# Patient Record
Sex: Female | Born: 1935 | Race: White | Hispanic: No | Marital: Married | Smoking: Never smoker
Health system: Southern US, Community
[De-identification: ages and names within clinical notes are randomized; demographics above are authoritative.]

## PROBLEM LIST (undated history)

## (undated) DIAGNOSIS — K219 Gastro-esophageal reflux disease without esophagitis: Secondary | ICD-10-CM

## (undated) DIAGNOSIS — R188 Other ascites: Secondary | ICD-10-CM

## (undated) DIAGNOSIS — F319 Bipolar disorder, unspecified: Secondary | ICD-10-CM

## (undated) DIAGNOSIS — M1990 Unspecified osteoarthritis, unspecified site: Secondary | ICD-10-CM

## (undated) DIAGNOSIS — I509 Heart failure, unspecified: Secondary | ICD-10-CM

## (undated) DIAGNOSIS — I10 Essential (primary) hypertension: Secondary | ICD-10-CM

## (undated) DIAGNOSIS — J90 Pleural effusion, not elsewhere classified: Secondary | ICD-10-CM

## (undated) HISTORY — PX: CHOLECYSTECTOMY: SHX55

---

## 2015-10-27 DIAGNOSIS — J90 Pleural effusion, not elsewhere classified: Secondary | ICD-10-CM

## 2015-10-27 HISTORY — DX: Pleural effusion, not elsewhere classified: J90

## 2015-11-12 ENCOUNTER — Emergency Department (HOSPITAL_COMMUNITY): Payer: Medicare Other

## 2015-11-12 ENCOUNTER — Inpatient Hospital Stay (HOSPITAL_COMMUNITY): Payer: Medicare Other

## 2015-11-12 ENCOUNTER — Inpatient Hospital Stay (HOSPITAL_COMMUNITY)
Admission: EM | Admit: 2015-11-12 | Discharge: 2015-11-23 | DRG: 441 | Disposition: A | Discharge status: Skilled Nursing Facility | Payer: Medicare Other | Attending: Internal Medicine | Admitting: Internal Medicine

## 2015-11-12 ENCOUNTER — Encounter (HOSPITAL_COMMUNITY): Payer: Self-pay | Admitting: Emergency Medicine

## 2015-11-12 DIAGNOSIS — I4891 Unspecified atrial fibrillation: Secondary | ICD-10-CM | POA: Diagnosis present

## 2015-11-12 DIAGNOSIS — I951 Orthostatic hypotension: Secondary | ICD-10-CM | POA: Diagnosis not present

## 2015-11-12 DIAGNOSIS — J9601 Acute respiratory failure with hypoxia: Secondary | ICD-10-CM | POA: Diagnosis present

## 2015-11-12 DIAGNOSIS — K7689 Other specified diseases of liver: Secondary | ICD-10-CM | POA: Diagnosis not present

## 2015-11-12 DIAGNOSIS — R0902 Hypoxemia: Secondary | ICD-10-CM

## 2015-11-12 DIAGNOSIS — Z6841 Body Mass Index (BMI) 40.0 and over, adult: Secondary | ICD-10-CM

## 2015-11-12 DIAGNOSIS — K7581 Nonalcoholic steatohepatitis (NASH): Secondary | ICD-10-CM | POA: Diagnosis not present

## 2015-11-12 DIAGNOSIS — R7989 Other specified abnormal findings of blood chemistry: Secondary | ICD-10-CM | POA: Diagnosis present

## 2015-11-12 DIAGNOSIS — R109 Unspecified abdominal pain: Secondary | ICD-10-CM | POA: Insufficient documentation

## 2015-11-12 DIAGNOSIS — I10 Essential (primary) hypertension: Secondary | ICD-10-CM | POA: Diagnosis not present

## 2015-11-12 DIAGNOSIS — D72829 Elevated white blood cell count, unspecified: Secondary | ICD-10-CM

## 2015-11-12 DIAGNOSIS — I248 Other forms of acute ischemic heart disease: Secondary | ICD-10-CM | POA: Diagnosis present

## 2015-11-12 DIAGNOSIS — N179 Acute kidney failure, unspecified: Secondary | ICD-10-CM | POA: Insufficient documentation

## 2015-11-12 DIAGNOSIS — R188 Other ascites: Secondary | ICD-10-CM | POA: Insufficient documentation

## 2015-11-12 DIAGNOSIS — K769 Liver disease, unspecified: Secondary | ICD-10-CM | POA: Diagnosis not present

## 2015-11-12 DIAGNOSIS — K767 Hepatorenal syndrome: Secondary | ICD-10-CM | POA: Diagnosis present

## 2015-11-12 DIAGNOSIS — I130 Hypertensive heart and chronic kidney disease with heart failure and stage 1 through stage 4 chronic kidney disease, or unspecified chronic kidney disease: Secondary | ICD-10-CM | POA: Diagnosis present

## 2015-11-12 DIAGNOSIS — F319 Bipolar disorder, unspecified: Secondary | ICD-10-CM | POA: Diagnosis present

## 2015-11-12 DIAGNOSIS — E872 Acidosis, unspecified: Secondary | ICD-10-CM

## 2015-11-12 DIAGNOSIS — J918 Pleural effusion in other conditions classified elsewhere: Secondary | ICD-10-CM

## 2015-11-12 DIAGNOSIS — G9340 Encephalopathy, unspecified: Secondary | ICD-10-CM | POA: Diagnosis not present

## 2015-11-12 DIAGNOSIS — R1084 Generalized abdominal pain: Secondary | ICD-10-CM | POA: Diagnosis not present

## 2015-11-12 DIAGNOSIS — R0602 Shortness of breath: Secondary | ICD-10-CM

## 2015-11-12 DIAGNOSIS — K744 Secondary biliary cirrhosis: Secondary | ICD-10-CM | POA: Diagnosis not present

## 2015-11-12 DIAGNOSIS — R627 Adult failure to thrive: Secondary | ICD-10-CM | POA: Diagnosis present

## 2015-11-12 DIAGNOSIS — E8809 Other disorders of plasma-protein metabolism, not elsewhere classified: Secondary | ICD-10-CM | POA: Diagnosis present

## 2015-11-12 DIAGNOSIS — Z79899 Other long term (current) drug therapy: Secondary | ICD-10-CM

## 2015-11-12 DIAGNOSIS — K219 Gastro-esophageal reflux disease without esophagitis: Secondary | ICD-10-CM | POA: Diagnosis present

## 2015-11-12 DIAGNOSIS — Z515 Encounter for palliative care: Secondary | ICD-10-CM

## 2015-11-12 DIAGNOSIS — R140 Abdominal distension (gaseous): Secondary | ICD-10-CM

## 2015-11-12 DIAGNOSIS — E871 Hypo-osmolality and hyponatremia: Secondary | ICD-10-CM | POA: Diagnosis present

## 2015-11-12 DIAGNOSIS — R778 Other specified abnormalities of plasma proteins: Secondary | ICD-10-CM | POA: Diagnosis present

## 2015-11-12 DIAGNOSIS — N183 Chronic kidney disease, stage 3 (moderate): Secondary | ICD-10-CM | POA: Diagnosis not present

## 2015-11-12 DIAGNOSIS — J90 Pleural effusion, not elsewhere classified: Secondary | ICD-10-CM | POA: Diagnosis not present

## 2015-11-12 DIAGNOSIS — J948 Other specified pleural conditions: Secondary | ICD-10-CM | POA: Diagnosis not present

## 2015-11-12 DIAGNOSIS — K7460 Unspecified cirrhosis of liver: Secondary | ICD-10-CM | POA: Insufficient documentation

## 2015-11-12 DIAGNOSIS — R601 Generalized edema: Secondary | ICD-10-CM | POA: Diagnosis present

## 2015-11-12 DIAGNOSIS — I5033 Acute on chronic diastolic (congestive) heart failure: Secondary | ICD-10-CM

## 2015-11-12 DIAGNOSIS — Z66 Do not resuscitate: Secondary | ICD-10-CM | POA: Diagnosis present

## 2015-11-12 DIAGNOSIS — N189 Chronic kidney disease, unspecified: Secondary | ICD-10-CM | POA: Diagnosis present

## 2015-11-12 DIAGNOSIS — I959 Hypotension, unspecified: Secondary | ICD-10-CM | POA: Insufficient documentation

## 2015-11-12 DIAGNOSIS — C801 Malignant (primary) neoplasm, unspecified: Secondary | ICD-10-CM

## 2015-11-12 DIAGNOSIS — J189 Pneumonia, unspecified organism: Secondary | ICD-10-CM | POA: Insufficient documentation

## 2015-11-12 DIAGNOSIS — A419 Sepsis, unspecified organism: Secondary | ICD-10-CM | POA: Diagnosis present

## 2015-11-12 DIAGNOSIS — Z881 Allergy status to other antibiotic agents status: Secondary | ICD-10-CM | POA: Diagnosis not present

## 2015-11-12 DIAGNOSIS — E8729 Other acidosis: Secondary | ICD-10-CM

## 2015-11-12 DIAGNOSIS — Z9889 Other specified postprocedural states: Secondary | ICD-10-CM

## 2015-11-12 HISTORY — DX: Unspecified osteoarthritis, unspecified site: M19.90

## 2015-11-12 HISTORY — DX: Pleural effusion, not elsewhere classified: J90

## 2015-11-12 HISTORY — DX: Gastro-esophageal reflux disease without esophagitis: K21.9

## 2015-11-12 HISTORY — DX: Bipolar disorder, unspecified: F31.9

## 2015-11-12 HISTORY — DX: Essential (primary) hypertension: I10

## 2015-11-12 HISTORY — DX: Other ascites: R18.8

## 2015-11-12 HISTORY — DX: Heart failure, unspecified: I50.9

## 2015-11-12 LAB — BASIC METABOLIC PANEL
Anion gap: 17 — ABNORMAL HIGH (ref 5–15)
BUN: 54 mg/dL — ABNORMAL HIGH (ref 6–20)
CALCIUM: 8.8 mg/dL — AB (ref 8.9–10.3)
CHLORIDE: 93 mmol/L — AB (ref 101–111)
CO2: 21 mmol/L — ABNORMAL LOW (ref 22–32)
CREATININE: 2.56 mg/dL — AB (ref 0.44–1.00)
GFR calc non Af Amer: 17 mL/min — ABNORMAL LOW (ref >60)
GFR, EST AFRICAN AMERICAN: 19 mL/min — AB (ref >60)
Glucose, Bld: 124 mg/dL — ABNORMAL HIGH (ref 65–99)
Potassium: 4.6 mmol/L (ref 3.5–5.1)
SODIUM: 131 mmol/L — AB (ref 135–145)

## 2015-11-12 LAB — I-STAT ARTERIAL BLOOD GAS, ED
ACID-BASE DEFICIT: 1 mmol/L (ref 0.0–2.0)
Bicarbonate: 23 mEq/L (ref 20.0–24.0)
O2 Saturation: 94 %
PH ART: 7.412 (ref 7.350–7.450)
TCO2: 24 mmol/L (ref 0–100)
pCO2 arterial: 36 mmHg (ref 35.0–45.0)
pO2, Arterial: 69 mmHg — ABNORMAL LOW (ref 80.0–100.0)

## 2015-11-12 LAB — CBC WITH DIFFERENTIAL/PLATELET
BASOS ABS: 0 10*3/uL (ref 0.0–0.1)
BASOS PCT: 0 %
EOS ABS: 0 10*3/uL (ref 0.0–0.7)
Eosinophils Relative: 0 %
HCT: 39.2 % (ref 36.0–46.0)
HEMOGLOBIN: 13 g/dL (ref 12.0–15.0)
LYMPHS ABS: 1 10*3/uL (ref 0.7–4.0)
Lymphocytes Relative: 5 %
MCH: 30.7 pg (ref 26.0–34.0)
MCHC: 33.2 g/dL (ref 30.0–36.0)
MCV: 92.7 fL (ref 78.0–100.0)
Monocytes Absolute: 1.3 10*3/uL — ABNORMAL HIGH (ref 0.1–1.0)
Monocytes Relative: 6 %
NEUTROS PCT: 89 %
Neutro Abs: 19.9 10*3/uL — ABNORMAL HIGH (ref 1.7–7.7)
Platelets: 219 10*3/uL (ref 150–400)
RBC: 4.23 MIL/uL (ref 3.87–5.11)
RDW: 14.2 % (ref 11.5–15.5)
WBC: 22.3 10*3/uL — AB (ref 4.0–10.5)

## 2015-11-12 LAB — I-STAT CG4 LACTIC ACID, ED: LACTIC ACID, VENOUS: 2.26 mmol/L — AB (ref 0.5–2.0)

## 2015-11-12 LAB — BRAIN NATRIURETIC PEPTIDE: B NATRIURETIC PEPTIDE 5: 470.9 pg/mL — AB (ref 0.0–100.0)

## 2015-11-12 LAB — D-DIMER, QUANTITATIVE (NOT AT ARMC): D DIMER QUANT: 18.12 ug{FEU}/mL — AB (ref 0.00–0.50)

## 2015-11-12 LAB — I-STAT TROPONIN, ED: TROPONIN I, POC: 0.06 ng/mL (ref 0.00–0.08)

## 2015-11-12 MED ORDER — PIPERACILLIN-TAZOBACTAM 3.375 G IVPB 30 MIN
3.3750 g | Freq: Once | INTRAVENOUS | Status: AC
  Administered 2015-11-12: 3.375 g via INTRAVENOUS
  Filled 2015-11-12: qty 50

## 2015-11-12 MED ORDER — VANCOMYCIN HCL IN DEXTROSE 1-5 GM/200ML-% IV SOLN: 1000.0000 mg | Freq: Once | INTRAVENOUS | Status: DC

## 2015-11-12 MED ORDER — VANCOMYCIN HCL 10 G IV SOLR
2000.0000 mg | Freq: Once | INTRAVENOUS | Status: AC
  Administered 2015-11-12: 2000 mg via INTRAVENOUS
  Filled 2015-11-12: qty 2000

## 2015-11-12 MED ORDER — FLUCONAZOLE IN SODIUM CHLORIDE 200-0.9 MG/100ML-% IV SOLN
200.0000 mg | Freq: Once | INTRAVENOUS | Status: AC
  Administered 2015-11-13: 200 mg via INTRAVENOUS
  Filled 2015-11-12: qty 100

## 2015-11-12 MED ORDER — SODIUM CHLORIDE 0.9 % IV BOLUS (SEPSIS)
1000.0000 mL | Freq: Once | INTRAVENOUS | Status: AC
  Administered 2015-11-12: 1000 mL via INTRAVENOUS

## 2015-11-12 MED ORDER — MAGIC MOUTHWASH
5.0000 mL | Freq: Three times a day (TID) | ORAL | Status: DC
  Administered 2015-11-12 – 2015-11-20 (×15): 5 mL via ORAL
  Filled 2015-11-12 (×34): qty 5

## 2015-11-12 MED ORDER — FLUCONAZOLE IN SODIUM CHLORIDE 100-0.9 MG/50ML-% IV SOLN
100.0000 mg | INTRAVENOUS | Status: AC
  Administered 2015-11-13 – 2015-11-18 (×6): 100 mg via INTRAVENOUS
  Filled 2015-11-12 (×8): qty 50

## 2015-11-12 MED ORDER — ONDANSETRON HCL 4 MG/2ML IJ SOLN: 4.0000 mg | Freq: Once | INTRAMUSCULAR | Status: DC

## 2015-11-12 NOTE — Progress Notes (Signed)
Pharmacy Antibiotic Note  Kendra Martinez is a 82 y.o. female admitted on 11/12/2015 with sepsis.  Presented with SOB and weakness.  Recently admitted on 4/3 for Klebsiella UTI, discovered pleural effusion.  PMH includes CHF, HTN.  Pharmacy has been consulted for Vancomycin and Zosyn dosing.  On Admit: Afebrile, RR 91 (Afib), RR 25, WBC 22.3 Hypotensive (105/49), SpO2 88%, CrCl 21.6, LA 2.26  Plan: --Vancomycin 2000 mg IV x 1, then 1500 mg IV q48h --Zosyn 3.375 g IV q8h (extended infusion) --Obtain vanc trough at steady state (goal 15-20) --Follow renal function, clinical course and cultures.  Height: 5\' 2"  (157.5 cm) Weight: 258 lb (117.028 kg) IBW/kg (Calculated) : 50.1  Temp (24hrs), Avg:98 F (36.7 C), Min:98 F (36.7 C), Max:98 F (36.7 C)   Recent Labs Lab 11/12/15 1619 11/12/15 1703  WBC 22.3*  --   CREATININE 2.56*  --   LATICACIDVEN  --  2.26*    Estimated Creatinine Clearance: 21.6 mL/min (by C-G formula based on Cr of 2.56).    Allergies  Allergen Reactions  . Sulfa Antibiotics Hives    Antimicrobials this admission: 4/17 Vanc >>  4/17 Zosyn >>   Dose adjustments this admission:   Microbiology results: 4/17 BCx:     Thank you for allowing pharmacy to be a part of this patient's care.  Viann Fish 11/12/2015 8:07 PM

## 2015-11-12 NOTE — ED Notes (Signed)
Pt to CT at this time.

## 2015-11-12 NOTE — ED Notes (Signed)
AMA explained to pt by Dr. Oleta Mouse and Vernie Shanks PA   Pt st's he understands the risks of him leaving.  AMA form unable to be signed by pt due to sig. Pad not working

## 2015-11-12 NOTE — ED Notes (Signed)
Resp tech at bedside attempting to draw ABG at this time

## 2015-11-12 NOTE — Progress Notes (Signed)
Pharmacy consulted to dose Fluconazole for oral thrush. Give 200 mg IV x 1 now followed by 100 mg IV q 24 hours. F/u response and change to PO when feasible.  Vincenza Hews, PharmD, BCPS 11/12/2015, 11:17 PM Pager: [phone]

## 2015-11-12 NOTE — ED Provider Notes (Signed)
CSN: [id number]     Arrival date & time 11/12/15  1445 History   First MD Initiated Contact with Patient 11/12/15 1518     Chief Complaint  Patient presents with  . Shortness of Breath     (Consider location/radiation/quality/duration/timing/severity/associated sxs/prior Treatment) HPI   This is a 82 year old female brought in by her family for shortness of breath and worsening weakness. The patient was admitted to the hospital in [facility] on 10/29/2015 for shortness of breath. Patient was found to have a Klebsiella urinary tract infection for which she was treated. Patient was also found to have a large right-sided pleural effusion. She was treated with Lasix with some improvement of the effusion. Patient also had an echocardiogram which showed EF of 60, 65%, mild aortic valve stenosis. Her labs showed acute kidney injury with a creatinine about 2.5 and elevated BUN. She is a leukocytosis of about 11,000. Her family states that after she has been discharged. She has been weak, short of breath. Her husband and daughter state that she gets extremely winded doing minor activities such as standing up or moving to the Chair. Patient also states that she has a sore throat and sore mouth and is difficult to swallow. She feels very weak and has new-onset laryngitis over the past few days. She denies any melena or hematochezia. She is not on any blood thinners. She is felt to be in atrial fibrillation today and has no previous history of the same.  Past Medical History  Diagnosis Date  . CHF (congestive heart failure) ([place])   . Hypertension    History reviewed. No pertinent past surgical history. History reviewed. No pertinent family history. Social History  Substance Use Topics  . Smoking status: Never Smoker   . Smokeless tobacco: None  . Alcohol Use: No   OB History    No data available     Review of Systems  Ten systems reviewed and are negative for acute change, except as noted in  the HPI.    Allergies  Sulfa antibiotics  Home Medications   Prior to Admission medications   Medication Sig Start Date End Date Taking? Authorizing Provider  albuterol (PROVENTIL HFA;VENTOLIN HFA) 108 (90 Base) MCG/ACT inhaler Inhale 1-2 puffs into the lungs every 4 (four) hours as needed for wheezing or shortness of breath.   Yes Historical Provider, MD  ALPRAZolam (XANAX) 0.25 MG tablet Take 0.25 mg by mouth 3 (three) times daily as needed for anxiety.   Yes Historical Provider, MD  furosemide (LASIX) 20 MG tablet Take 20 mg by mouth daily.   Yes Historical Provider, MD  metoprolol tartrate (LOPRESSOR) 25 MG tablet Take 25 mg by mouth 2 (two) times daily.   Yes Historical Provider, MD  mometasone-formoterol (DULERA) 200-5 MCG/ACT AERO Inhale 2 puffs into the lungs 2 (two) times daily.   Yes Historical Provider, MD  Multiple Vitamin (MULTIVITAMIN) tablet Take 1 tablet by mouth daily.   Yes Historical Provider, MD  omeprazole (PRILOSEC) 20 MG capsule Take 20 mg by mouth daily.   Yes Historical Provider, MD  ondansetron (ZOFRAN) 4 MG tablet Take 4 mg by mouth daily as needed for nausea.   Yes Historical Provider, MD  promethazine (PHENERGAN) 25 MG tablet Take 25 mg by mouth every 8 (eight) hours as needed for nausea or vomiting.   Yes Historical Provider, MD  sertraline (ZOLOFT) 25 MG tablet Take 25 mg by mouth daily.   Yes Historical Provider, MD  Tiotropium Bromide Monohydrate (SPIRIVA  RESPIMAT) 2.5 MCG/ACT AERS Inhale 2 puffs into the lungs daily.    Yes Historical Provider, MD  traMADol (ULTRAM) 50 MG tablet Take 50 mg by mouth every 12 (twelve) hours as needed for moderate pain.   Yes Historical Provider, MD   BP 111/56 mmHg  Pulse 102  Temp(Src) 98 F (36.7 C) (Oral)  Resp 20  SpO2 94% Physical Exam  Constitutional: She is oriented to person, place, and time. She appears well-developed and well-nourished. No distress.  HENT:  Head: Normocephalic and atraumatic.  Erythema of the  tongue and palate with multiple white patches. Pale  Eyes: Conjunctivae are normal. No scleral icterus.  Neck: Normal range of motion.  Cardiovascular: Normal rate, regular rhythm and normal heart sounds.  Exam reveals no gallop and no friction rub.   No murmur heard. Pulmonary/Chest: Effort normal. No respiratory distress.  Crease, but symptoms of the right lower lobe  Abdominal: Soft. Bowel sounds are normal. She exhibits no distension and no mass. There is no tenderness. There is no guarding.  Neurological: She is alert and oriented to person, place, and time.  Skin: Skin is warm and dry. She is not diaphoretic.  Nursing note and vitals reviewed.   ED Course  Procedures (including critical care time) Labs Review Labs Reviewed  BASIC METABOLIC PANEL - Abnormal; Notable for the following:    Sodium 131 (*)    Chloride 93 (*)    CO2 21 (*)    Glucose, Bld 124 (*)    BUN 54 (*)    Creatinine, Ser 2.56 (*)    Calcium 8.8 (*)    GFR calc non Af Amer 17 (*)    GFR calc Af Amer 19 (*)    Anion gap 17 (*)    All other components within normal limits  CBC WITH DIFFERENTIAL/PLATELET - Abnormal; Notable for the following:    WBC 22.3 (*)    Neutro Abs 19.9 (*)    Monocytes Absolute 1.3 (*)    All other components within normal limits  D-DIMER, QUANTITATIVE (NOT AT [facility]) - Abnormal; Notable for the following:    D-Dimer, Quant 18.12 (*)    All other components within normal limits  I-STAT ARTERIAL BLOOD GAS, ED - Abnormal; Notable for the following:    pO2, Arterial 69.0 (*)    All other components within normal limits  BRAIN NATRIURETIC PEPTIDE  I-STAT TROPOININ, ED  I-STAT CG4 LACTIC ACID, ED    Imaging Review Dg Chest 2 View  11/12/2015  CLINICAL DATA:  Shortness of breath for 2 weeks. Recent diagnosis of pneumonia. EXAM: CHEST  2 VIEW COMPARISON:  None. FINDINGS: Heart size and pulmonary vascularity are normal. There is calcification in the arch of the aorta. There is a  moderate right pleural effusion. Left lung is clear. No acute bone abnormality. Degenerative changes of both shoulders with a large calcified loose body in the right shoulder joint in the subcoracoid recess. IMPRESSION: Moderate right pleural effusion.  Aortic atherosclerosis. Electronically Signed   By: Lorriane Shire M.D.   On: 11/12/2015 16:09   I have personally reviewed and evaluated these images and lab results as part of my medical decision-making.   EKG Interpretation None      MDM   Final diagnoses:  Hypoxia  New onset a-fib (HCC)  SOB (shortness of breath)  Pleural effusion  Increased anion gap metabolic acidosis  Leukocytosis  Lactic acidosis    8:32 PM  Patient will pleural effusion, hypoxia, shortness  of breath. Elevated lactate, I think is secondary to being dehydrated. She does have elevated white blood cell count to have covered her with pink and Zosyn for unknown source. Blood pressure is soft or above [id number] systolic throughout her visit. Her A. fib has been constant with unknown onset, but rate controlled. I discussed the findings with the patient and her family. She will need admission. Feels she likely needs a thorocentesis. Patient has large areas of hypoattenuation and liver. Questionable cysts. These were seen on CT scan at her previous admission earlier this month without mention of ascites. Blood cultures are pending. Urine is pending. I have ordered Magic mouthwash 5 ML's 3 times a day for oral thrush. Patient is requesting antinausea medications at this time. She is stable throughout visit. Patient seen in shared visit with attending physician.     Margarita Mail, PA-C 11/12/15 2035  Forde Dandy, MD 11/13/15 [phone]

## 2015-11-12 NOTE — ED Notes (Signed)
Pt to ER via [facility] with complaint of shortness of breath x "couple of weeks." pt was recently diagnosed with pneumonia, treated with antibiotiocs. Pt reports shortness of breath has not gotten any better. Atrial fibrillation on monitor which is said to be new. Hx of CHF. VS - 160/90, 93% on RA, HR 110.

## 2015-11-12 NOTE — ED Notes (Signed)
PT to xray at this time.

## 2015-11-12 NOTE — ED Notes (Signed)
Pt returned from x-ray, placed back on cardiac monitor, 02 sats and b/p.  No complaints voiced by pt.  Family remains at bedside

## 2015-11-12 NOTE — H&P (Signed)
PCP:  No PCP Per Patient    Referring provider Abigail PA   Chief Complaint: Shortness of breath   HPI: Kendra Martinez is a 82 y.o. female   has a past medical history of CHF (congestive heart failure) ([place]) and Hypertension.  Presented with worsening shortness of breath ever since her discharged from back to [facility] where she was diagnosed with diastolic heart failure and right pleural effusion. She was eventually diagnosed with pneumonia was treated with cephalexin and levaquin with some improvement and discharged to home on 04/08 . She finished a course of her antibiotics.   She denies any fever. She never regained her strength. Denies any chest pain.  She has gained weight despite very poor appetite.  She has had significant cough and have been so severe that it caused nausea and vomiting. clear  Sputum. Patient deteriorated rapidly at home with increasing shortness of breath. Severe fatigue she could no longer walk. She had physical therapy come out but eventually she ws so weak she could not do it. She states lasix made her sick and she stopped taking it. For past 3 days she had discharge on her tongue and pain with swallowing as well and horse voice.  IN ER: His fontanelle W DC 22.3 platelets 219 hemoglobin 13 ABG was done showing 7.41/36/69 Sodium down to 131 creatinine 2.56 up from discharge of 2.03 troponin normal was 0.06 D-dimer elevated 18.12 lactic acid 2.06 BNP 470  Regarding pertinent past history: patient had recent admission on 04/03 to [facility] she was diagnosed with R pleural effusion was suspected secondary to CHF acute renal failure with creatinine up to 2.5 and BNP  elevated at 4933 was diagnosed with urinary tract infection growing Klebsiella pneumoniae sensitive to ceftriaxone and ciprofloxacin. Hepatitis serologies were negative. He had CT scan of the abdomen done showed small right pleural effusion ascites along the anterior right hemidiaphragm  low density fluid collections along the right lateral aspect of the liver. Ultrasound was done showing hepatic cysts. During that admission she had undergone echogram mild left atrial enlargement mild to moderate MR and mild concentric LVH normal LV function EF 55-60 normal right ventricle.    Hospitalist was called for admission for right pleural effusion needing thoracentesis if evidence of hypoxic respiratory failure  Review of Systems:    Pertinent positives include:  shortness of breath at rest.  dyspnea on exertion,  Constitutional:  No weight loss, night sweats, Fevers, chills, fatigue, weight loss  HEENT:  No headaches, Difficulty swallowing,Tooth/dental problems,Sore throat,  No sneezing, itching, ear ache, nasal congestion, post nasal drip,  Cardio-vascular:  No chest pain, Orthopnea, PND, anasarca, dizziness, palpitations.no Bilateral lower extremity swelling  GI:  No heartburn, indigestion, abdominal pain, nausea, vomiting, diarrhea, change in bowel habits, loss of appetite, melena, blood in stool, hematemesis Resp:  no  No excess mucus, no productive cough, No non-productive cough, No coughing up of blood.No change in color of mucus.No wheezing. Skin:  no rash or lesions. No jaundice GU:  no dysuria, change in color of urine, no urgency or frequency. No straining to urinate.  No flank pain.  Musculoskeletal:  No joint pain or no joint swelling. No decreased range of motion. No back pain.  Psych:  No change in mood or affect. No depression or anxiety. No memory loss.  Neuro: no localizing neurological complaints, no tingling, no weakness, no double vision, no gait abnormality, no slurred speech, no confusion  Otherwise ROS are negative except  for above, 10 systems were reviewed  Past Medical History: Past Medical History  Diagnosis Date  . CHF (congestive heart failure) ([place])   . Hypertension    History reviewed. No pertinent past surgical  history.   Medications: Prior to Admission medications   Medication Sig Start Date End Date Taking? Authorizing Provider  albuterol (PROVENTIL HFA;VENTOLIN HFA) 108 (90 Base) MCG/ACT inhaler Inhale 1-2 puffs into the lungs every 4 (four) hours as needed for wheezing or shortness of breath.   Yes Historical Provider, MD  ALPRAZolam (XANAX) 0.25 MG tablet Take 0.25 mg by mouth 3 (three) times daily as needed for anxiety.   Yes Historical Provider, MD  furosemide (LASIX) 20 MG tablet Take 20 mg by mouth daily.   Yes Historical Provider, MD  metoprolol tartrate (LOPRESSOR) 25 MG tablet Take 25 mg by mouth 2 (two) times daily.   Yes Historical Provider, MD  mometasone-formoterol (DULERA) 200-5 MCG/ACT AERO Inhale 2 puffs into the lungs 2 (two) times daily.   Yes Historical Provider, MD  Multiple Vitamin (MULTIVITAMIN) tablet Take 1 tablet by mouth daily.   Yes Historical Provider, MD  omeprazole (PRILOSEC) 20 MG capsule Take 20 mg by mouth daily.   Yes Historical Provider, MD  ondansetron (ZOFRAN) 4 MG tablet Take 4 mg by mouth daily as needed for nausea.   Yes Historical Provider, MD  promethazine (PHENERGAN) 25 MG tablet Take 25 mg by mouth every 8 (eight) hours as needed for nausea or vomiting.   Yes Historical Provider, MD  sertraline (ZOLOFT) 25 MG tablet Take 25 mg by mouth daily.   Yes Historical Provider, MD  Tiotropium Bromide Monohydrate (SPIRIVA RESPIMAT) 2.5 MCG/ACT AERS Inhale 2 puffs into the lungs daily.    Yes Historical Provider, MD  traMADol (ULTRAM) 50 MG tablet Take 50 mg by mouth every 12 (twelve) hours as needed for moderate pain.   Yes Historical Provider, MD    Allergies:   Allergies  Allergen Reactions  . Sulfa Antibiotics Hives    Social History:  Ambulatory   independently initially but not recently Lives at home  With family     reports that she has never smoked. She does not have any smokeless tobacco history on file. She reports that she does not drink  alcohol.     Family History: family history includes COPD in her father; Diabetes type II in her other; Kidney cancer in her mother. There is no history of Stroke.    Physical Exam: Patient Vitals for the past 24 hrs:  BP Temp Temp src Pulse Resp SpO2 Height Weight  11/12/15 2115 125/78 mmHg - - 101 22 96 % - -  11/12/15 2045 105/59 mmHg - - 90 23 96 % - -  11/12/15 2030 120/66 mmHg - - 96 22 97 % - -  11/12/15 2015 124/70 mmHg - - 105 23 96 % - -  11/12/15 2005 - - - - - - 5\' 2"  (1.575 m) 117.028 kg (258 lb)  11/12/15 2000 114/69 mmHg - - 98 23 97 % - -  11/12/15 1945 114/56 mmHg - - 103 23 96 % - -  11/12/15 1930 125/67 mmHg - - 92 22 97 % - -  11/12/15 1915 117/72 mmHg - - 106 22 97 % - -  11/12/15 1745 110/78 mmHg - - 97 21 95 % - -  11/12/15 1730 108/65 mmHg - - 100 20 94 % - -  11/12/15 1715 108/80 mmHg - - 96 22  94 % - -  11/12/15 1700 112/77 mmHg - - 96 24 94 % - -  11/12/15 1645 117/72 mmHg - - 106 21 96 % - -  11/12/15 1545 111/56 mmHg - - 102 20 94 % - -  11/12/15 1530 116/64 mmHg - - 113 21 93 % - -  11/12/15 1515 113/57 mmHg - - 101 18 94 % - -  11/12/15 1500 (!) 105/49 mmHg - - 91 25 (!) 88 % - -  11/12/15 1458 112/98 mmHg 98 F (36.7 C) Oral 110 20 93 % - -    1. General:  in No Acute distress 2. Psychological: Alert and   Oriented 3. Head/ENT:     Dry Mucous Membranes                          Head Non traumatic, neck supple                          Normal   Dentition 4. SKIN: decreased Skin turgor,  Skin clean Dry and intact no rash 5. Heart: Regular rate and rhythm systolic  Murmur, Rub or gallop 6. Lungs:   no wheezes or crackles  Diminished on the right 7. Abdomen: Soft, non-tender, Non distended 8. Lower extremities: no clubbing, cyanosis, or edema 9. Neurologically Grossly intact, moving all 4 extremities equally 10. MSK: Normal range of motion  body mass index is 47.18 kg/(m^2).   Labs on Admission:   Results for orders placed or performed  during the hospital encounter of 11/12/15 (from the past 24 hour(s))  I-Stat arterial blood gas, ED     Status: Abnormal   Collection Time: 11/12/15  3:49 PM  Result Value Ref Range   pH, Arterial 7.412 7.350 - 7.450   pCO2 arterial 36.0 35.0 - 45.0 mmHg   pO2, Arterial 69.0 (L) 80.0 - 100.0 mmHg   Bicarbonate 23.0 20.0 - 24.0 mEq/L   TCO2 24 0 - 100 mmol/L   O2 Saturation 94.0 %   Acid-base deficit 1.0 0.0 - 2.0 mmol/L   Patient temperature 98.6 F    Collection site RADIAL, ALLEN'S TEST ACCEPTABLE    Drawn by Operator    Sample type ARTERIAL   Basic metabolic panel     Status: Abnormal   Collection Time: 11/12/15  4:19 PM  Result Value Ref Range   Sodium 131 (L) 135 - 145 mmol/L   Potassium 4.6 3.5 - 5.1 mmol/L   Chloride 93 (L) 101 - 111 mmol/L   CO2 21 (L) 22 - 32 mmol/L   Glucose, Bld 124 (H) 65 - 99 mg/dL   BUN 54 (H) 6 - 20 mg/dL   Creatinine, Ser 2.56 (H) 0.44 - 1.00 mg/dL   Calcium 8.8 (L) 8.9 - 10.3 mg/dL   GFR calc non Af Amer 17 (L) >60 mL/min   GFR calc Af Amer 19 (L) >60 mL/min   Anion gap 17 (H) 5 - 15  CBC with Differential     Status: Abnormal   Collection Time: 11/12/15  4:19 PM  Result Value Ref Range   WBC 22.3 (H) 4.0 - 10.5 K/uL   RBC 4.23 3.87 - 5.11 MIL/uL   Hemoglobin 13.0 12.0 - 15.0 g/dL   HCT 39.2 36.0 - 46.0 %   MCV 92.7 78.0 - 100.0 fL   MCH 30.7 26.0 - 34.0 pg   MCHC 33.2 30.0 - 36.0  g/dL   RDW 14.2 11.5 - 15.5 %   Platelets 219 150 - 400 K/uL   Neutrophils Relative % 89 %   Neutro Abs 19.9 (H) 1.7 - 7.7 K/uL   Lymphocytes Relative 5 %   Lymphs Abs 1.0 0.7 - 4.0 K/uL   Monocytes Relative 6 %   Monocytes Absolute 1.3 (H) 0.1 - 1.0 K/uL   Eosinophils Relative 0 %   Eosinophils Absolute 0.0 0.0 - 0.7 K/uL   Basophils Relative 0 %   Basophils Absolute 0.0 0.0 - 0.1 K/uL  D-dimer, quantitative     Status: Abnormal   Collection Time: 11/12/15  4:19 PM  Result Value Ref Range   D-Dimer, Quant 18.12 (H) 0.00 - 0.50 ug/mL-FEU  Brain  natriuretic peptide     Status: Abnormal   Collection Time: 11/12/15  4:20 PM  Result Value Ref Range   B Natriuretic Peptide 470.9 (H) 0.0 - 100.0 pg/mL  I-stat troponin, ED     Status: None   Collection Time: 11/12/15  4:23 PM  Result Value Ref Range   Troponin i, poc 0.06 0.00 - 0.08 ng/mL   Comment 3          I-Stat CG4 Lactic Acid, ED     Status: Abnormal   Collection Time: 11/12/15  5:03 PM  Result Value Ref Range   Lactic Acid, Venous 2.26 (HH) 0.5 - 2.0 mmol/L   Comment NOTIFIED PHYSICIAN     UA ordered  No results found for: HGBA1C  Estimated Creatinine Clearance: 21.6 mL/min (by C-G formula based on Cr of 2.56).  BNP (last 3 results) No results for input(s): PROBNP in the last 8760 hours.  Other results:  I have pearsonaly reviewed this: ECG REPORT  Rate: 106  Rhythm: Atrial fibrillation ST&T Change: Flattened T waves in lateral leads QTC 404  Filed Weights   11/12/15 2005  Weight: 117.028 kg (258 lb)     Cultures: No results found for: SDES, SPECREQUEST, CULT, REPTSTATUS   Radiological Exams on Admission: Dg Chest 2 View  11/12/2015  CLINICAL DATA:  Shortness of breath for 2 weeks. Recent diagnosis of pneumonia. EXAM: CHEST  2 VIEW COMPARISON:  None. FINDINGS: Heart size and pulmonary vascularity are normal. There is calcification in the arch of the aorta. There is a moderate right pleural effusion. Left lung is clear. No acute bone abnormality. Degenerative changes of both shoulders with a large calcified loose body in the right shoulder joint in the subcoracoid recess. IMPRESSION: Moderate right pleural effusion.  Aortic atherosclerosis. Electronically Signed   By: Lorriane Shire M.D.   On: 11/12/2015 16:09   Ct Chest Wo Contrast  11/12/2015  CLINICAL DATA:  Shortness of breath, 2 weeks duration. Treated for pneumonia. Pleural effusion. EXAM: CT CHEST WITHOUT CONTRAST TECHNIQUE: Multidetector CT imaging of the chest was performed following the standard  protocol without IV contrast. COMPARISON:  Radiography same day FINDINGS: There is a moderate to large pleural effusion on the right layering dependently. There is dependent atelectasis in the right lung. The aerated portion of the right lung is clear except for minimal atelectasis or scarring in the middle lobe. No pleural effusion on the left. There is minimal atelectasis at the left base. The left lung is otherwise clear. No underlying emphysema. No mediastinal or hilar mass or lymphadenopathy. There is atherosclerosis of the aorta and extensive coronary artery calcification. Scans in the upper abdomen show a large amount of ascites. There are at least 2 large  liver cysts, not completely evaluated. The larger cyst at the junction of the right and left lobes measures up to 11 cm in diameter. Right lobe cyst partially imaged measures up to 7 cm in diameter. IMPRESSION: Right pleural effusion layering dependently with dependent pulmonary atelectasis. The aerated lung does not show evidence of pre-existing lung disease or active pneumonia. Atherosclerosis of the aorta and the coronary arteries. Large amount of ascites. Two large liver cysts. The abdomen was not completely evaluated. Electronically Signed   By: Nelson Chimes M.D.   On: 11/12/2015 18:30    Chart has been reviewed  Family   at  Bedside  plan of care was discussed with  Kaitrin Decoux [phone] cell,   Assessment/Plan  82 year old female with history of right pleural effusion for unclear etiology recently diagnosed breast heart failure and hypertension being admitted for recurrent pleural effusion result in respiratory failure with hypoxia, with evidence of renal failure patient was found to have new onset atrial fibrillation and elevated troponin started on heparin   Present on Admission:  . Pleural effusion discussed with pulmonology. Patient consult appreciated that and put possible for thoracentesis tomorrow  . Essential  hypertension - initially somewhat soft blood pressure resumed Toprol currently vitals stable  . Sepsis ([place]) - meeting sepsis criteria with tachycardia and elevated white blood cell count and increased respiratory rate. Source unclear could be possibly secondary to underlying pulmonary infection. Will continue broad-spectrum antibiotics started in emergency department included Zosyn and vancomycin once pleural effusion has been resolved will need repeat imaging to make sure is no evidence of underlying malignancy and or infiltrate  . Anasarca in a setting of renal insufficiency and history of diastolic heart failure. Patient initially hypotensive was given IV fluids in the emergency department at this point will hold off on further diuresis until patient is stabilized. If blood pressure allows would start diuresis tomorrow.  . New onset a-fib ([place]) - likely contributing to CHF. We will obtain a repeat echogram given new diagnosis. CHA2D vas2c score  Is 5 Initiate heparin  . Elevated troponin - in a setting of hypoxia and increased demand. We'll continue to trend cardiac markers obtain echogram. Appreciate cardiology consult   Prophylaxis: heparin   CODE STATUS:  FULL CODE DNR/DNI as per patient    Disposition:   likely will need placement for rehabilitation                          Other plan as per orders.  I have spent a total of 75 min on this admissionextra time was spent to discuss case with [facility] as well as to review outside records  [place] 11/13/2015, 3:16 AM   [facility]  Pager [phone]   after 2 AM please page floor coverage PA If 7AM-7PM, please contact the day team taking care of the patient  [email or web address]  Password [email or web address]

## 2015-11-13 ENCOUNTER — Inpatient Hospital Stay (HOSPITAL_COMMUNITY): Payer: Medicare Other

## 2015-11-13 ENCOUNTER — Encounter (HOSPITAL_COMMUNITY): Payer: Self-pay | Admitting: Physician Assistant

## 2015-11-13 DIAGNOSIS — R778 Other specified abnormalities of plasma proteins: Secondary | ICD-10-CM | POA: Diagnosis present

## 2015-11-13 DIAGNOSIS — E871 Hypo-osmolality and hyponatremia: Secondary | ICD-10-CM

## 2015-11-13 DIAGNOSIS — R601 Generalized edema: Secondary | ICD-10-CM

## 2015-11-13 DIAGNOSIS — I5033 Acute on chronic diastolic (congestive) heart failure: Secondary | ICD-10-CM

## 2015-11-13 DIAGNOSIS — I4891 Unspecified atrial fibrillation: Secondary | ICD-10-CM

## 2015-11-13 DIAGNOSIS — J90 Pleural effusion, not elsewhere classified: Secondary | ICD-10-CM

## 2015-11-13 DIAGNOSIS — R0902 Hypoxemia: Secondary | ICD-10-CM

## 2015-11-13 DIAGNOSIS — R188 Other ascites: Secondary | ICD-10-CM | POA: Insufficient documentation

## 2015-11-13 DIAGNOSIS — N183 Chronic kidney disease, stage 3 (moderate): Secondary | ICD-10-CM

## 2015-11-13 DIAGNOSIS — I10 Essential (primary) hypertension: Secondary | ICD-10-CM

## 2015-11-13 DIAGNOSIS — R7989 Other specified abnormal findings of blood chemistry: Secondary | ICD-10-CM | POA: Diagnosis present

## 2015-11-13 DIAGNOSIS — J9601 Acute respiratory failure with hypoxia: Secondary | ICD-10-CM | POA: Insufficient documentation

## 2015-11-13 LAB — COMPREHENSIVE METABOLIC PANEL
ALBUMIN: 2.3 g/dL — AB (ref 3.5–5.0)
ALBUMIN: 2.2 g/dL — AB (ref 3.5–5.0)
ALK PHOS: 59 U/L (ref 38–126)
ALK PHOS: 55 U/L (ref 38–126)
ALT: 18 U/L (ref 14–54)
ALT: 15 U/L (ref 14–54)
ANION GAP: 14 (ref 5–15)
AST: 25 U/L (ref 15–41)
AST: 21 U/L (ref 15–41)
Anion gap: 17 — ABNORMAL HIGH (ref 5–15)
BILIRUBIN TOTAL: 1.1 mg/dL (ref 0.3–1.2)
BUN: 52 mg/dL — AB (ref 6–20)
BUN: 52 mg/dL — AB (ref 6–20)
CALCIUM: 8 mg/dL — AB (ref 8.9–10.3)
CALCIUM: 7.9 mg/dL — AB (ref 8.9–10.3)
CO2: 19 mmol/L — ABNORMAL LOW (ref 22–32)
CO2: 22 mmol/L (ref 22–32)
CREATININE: 2.47 mg/dL — AB (ref 0.44–1.00)
CREATININE: 2.37 mg/dL — AB (ref 0.44–1.00)
Chloride: 96 mmol/L — ABNORMAL LOW (ref 101–111)
Chloride: 97 mmol/L — ABNORMAL LOW (ref 101–111)
GFR calc Af Amer: 20 mL/min — ABNORMAL LOW (ref >60)
GFR calc Af Amer: 21 mL/min — ABNORMAL LOW (ref >60)
GFR calc non Af Amer: 17 mL/min — ABNORMAL LOW (ref >60)
GFR calc non Af Amer: 18 mL/min — ABNORMAL LOW (ref >60)
GLUCOSE: 116 mg/dL — AB (ref 65–99)
GLUCOSE: 97 mg/dL (ref 65–99)
Potassium: 4.1 mmol/L (ref 3.5–5.1)
Potassium: 4.4 mmol/L (ref 3.5–5.1)
SODIUM: 132 mmol/L — AB (ref 135–145)
SODIUM: 133 mmol/L — AB (ref 135–145)
TOTAL PROTEIN: 5.2 g/dL — AB (ref 6.5–8.1)
Total Bilirubin: 1.4 mg/dL — ABNORMAL HIGH (ref 0.3–1.2)
Total Protein: 5.3 g/dL — ABNORMAL LOW (ref 6.5–8.1)

## 2015-11-13 LAB — TROPONIN I
TROPONIN I: 0.07 ng/mL — AB (ref <0.031)
TROPONIN I: 0.08 ng/mL — AB (ref <0.031)
TROPONIN I: 0.11 ng/mL — AB (ref <0.031)

## 2015-11-13 LAB — LACTIC ACID, PLASMA
LACTIC ACID, VENOUS: 1.2 mmol/L (ref 0.5–2.0)
Lactic Acid, Venous: 1.2 mmol/L (ref 0.5–2.0)
Lactic Acid, Venous: 1.5 mmol/L (ref 0.5–2.0)

## 2015-11-13 LAB — CBC
HCT: 37.9 % (ref 36.0–46.0)
Hemoglobin: 12.5 g/dL (ref 12.0–15.0)
MCH: 30.6 pg (ref 26.0–34.0)
MCHC: 33 g/dL (ref 30.0–36.0)
MCV: 92.7 fL (ref 78.0–100.0)
PLATELETS: 217 10*3/uL (ref 150–400)
RBC: 4.09 MIL/uL (ref 3.87–5.11)
RDW: 14.5 % (ref 11.5–15.5)
WBC: 18.9 10*3/uL — ABNORMAL HIGH (ref 4.0–10.5)

## 2015-11-13 LAB — URINALYSIS, ROUTINE W REFLEX MICROSCOPIC
GLUCOSE, UA: NEGATIVE mg/dL
Hgb urine dipstick: NEGATIVE
Ketones, ur: NEGATIVE mg/dL
NITRITE: NEGATIVE
PH: 5.5 (ref 5.0–8.0)
Protein, ur: 30 mg/dL — AB
SPECIFIC GRAVITY, URINE: 1.023 (ref 1.005–1.030)

## 2015-11-13 LAB — MAGNESIUM: Magnesium: 1.6 mg/dL — ABNORMAL LOW (ref 1.7–2.4)

## 2015-11-13 LAB — HEPARIN LEVEL (UNFRACTIONATED)
HEPARIN UNFRACTIONATED: 0.48 [IU]/mL (ref 0.30–0.70)
HEPARIN UNFRACTIONATED: 0.58 [IU]/mL (ref 0.30–0.70)

## 2015-11-13 LAB — APTT: aPTT: 122 seconds — ABNORMAL HIGH (ref 24–37)

## 2015-11-13 LAB — CREATININE, URINE, RANDOM: Creatinine, Urine: 212.96 mg/dL

## 2015-11-13 LAB — SODIUM, URINE, RANDOM: Sodium, Ur: <10 mmol/L

## 2015-11-13 LAB — URINE MICROSCOPIC-ADD ON

## 2015-11-13 LAB — MRSA PCR SCREENING: MRSA by PCR: NEGATIVE

## 2015-11-13 LAB — PROTIME-INR
INR: 1.59 — ABNORMAL HIGH (ref 0.00–1.49)
PROTHROMBIN TIME: 19 s — AB (ref 11.6–15.2)

## 2015-11-13 LAB — PREALBUMIN: Prealbumin: 5.9 mg/dL — ABNORMAL LOW (ref 18–38)

## 2015-11-13 LAB — PROCALCITONIN: PROCALCITONIN: 0.65 ng/mL

## 2015-11-13 LAB — OSMOLALITY, URINE: Osmolality, Ur: 501 mOsm/kg (ref 300–900)

## 2015-11-13 LAB — PHOSPHORUS: Phosphorus: 3.9 mg/dL (ref 2.5–4.6)

## 2015-11-13 LAB — TSH: TSH: 3.124 u[IU]/mL (ref 0.350–4.500)

## 2015-11-13 MED ORDER — HYDROCODONE-ACETAMINOPHEN 5-325 MG PO TABS
1.0000 | ORAL_TABLET | ORAL | Status: DC | PRN
  Administered 2015-11-13 – 2015-11-16 (×3): 1 via ORAL
  Administered 2015-11-18 (×2): 2 via ORAL
  Administered 2015-11-20 – 2015-11-21 (×4): 1 via ORAL
  Filled 2015-11-13 (×2): qty 1
  Filled 2015-11-13: qty 2
  Filled 2015-11-13: qty 1
  Filled 2015-11-13: qty 2
  Filled 2015-11-13 (×4): qty 1

## 2015-11-13 MED ORDER — HEPARIN BOLUS VIA INFUSION
4000.0000 [IU] | Freq: Once | INTRAVENOUS | Status: AC
  Administered 2015-11-13: 4000 [IU] via INTRAVENOUS
  Filled 2015-11-13: qty 4000

## 2015-11-13 MED ORDER — VANCOMYCIN HCL 10 G IV SOLR
1500.0000 mg | INTRAVENOUS | Status: DC
  Administered 2015-11-14: 1500 mg via INTRAVENOUS
  Filled 2015-11-13: qty 1500

## 2015-11-13 MED ORDER — ACETAMINOPHEN 325 MG PO TABS
650.0000 mg | ORAL_TABLET | Freq: Four times a day (QID) | ORAL | Status: DC | PRN
  Administered 2015-11-14 – 2015-11-15 (×3): 650 mg via ORAL
  Filled 2015-11-13 (×3): qty 2

## 2015-11-13 MED ORDER — PANTOPRAZOLE SODIUM 40 MG PO TBEC
40.0000 mg | DELAYED_RELEASE_TABLET | Freq: Every day | ORAL | Status: DC
  Administered 2015-11-13 – 2015-11-15 (×3): 40 mg via ORAL
  Filled 2015-11-13 (×3): qty 1

## 2015-11-13 MED ORDER — SODIUM CHLORIDE 0.9% FLUSH: 3.0000 mL | INTRAVENOUS | Status: DC | PRN

## 2015-11-13 MED ORDER — SODIUM CHLORIDE 0.9 % IV SOLN
250.0000 mL | INTRAVENOUS | Status: DC | PRN
  Administered 2015-11-17: 500 mL via INTRAVENOUS
  Administered 2015-11-20: 250 mL via INTRAVENOUS

## 2015-11-13 MED ORDER — SODIUM CHLORIDE 0.9% FLUSH
3.0000 mL | Freq: Two times a day (BID) | INTRAVENOUS | Status: DC
  Administered 2015-11-13 – 2015-11-21 (×5): 3 mL via INTRAVENOUS

## 2015-11-13 MED ORDER — ALPRAZOLAM 0.25 MG PO TABS
0.2500 mg | ORAL_TABLET | Freq: Three times a day (TID) | ORAL | Status: DC | PRN
  Administered 2015-11-14: 0.25 mg via ORAL
  Filled 2015-11-13: qty 1

## 2015-11-13 MED ORDER — ONDANSETRON HCL 4 MG PO TABS
4.0000 mg | ORAL_TABLET | Freq: Four times a day (QID) | ORAL | Status: DC | PRN
  Administered 2015-11-14: 4 mg via ORAL
  Filled 2015-11-13: qty 1

## 2015-11-13 MED ORDER — SODIUM CHLORIDE 0.9% FLUSH
3.0000 mL | Freq: Two times a day (BID) | INTRAVENOUS | Status: DC
  Administered 2015-11-13 – 2015-11-22 (×5): 3 mL via INTRAVENOUS

## 2015-11-13 MED ORDER — FUROSEMIDE 10 MG/ML IJ SOLN
80.0000 mg | Freq: Two times a day (BID) | INTRAMUSCULAR | Status: DC
  Administered 2015-11-13 – 2015-11-14 (×2): 80 mg via INTRAVENOUS
  Filled 2015-11-13 (×2): qty 8

## 2015-11-13 MED ORDER — PIPERACILLIN-TAZOBACTAM 3.375 G IVPB
3.3750 g | Freq: Three times a day (TID) | INTRAVENOUS | Status: DC
  Administered 2015-11-13 – 2015-11-15 (×7): 3.375 g via INTRAVENOUS
  Filled 2015-11-13 (×10): qty 50

## 2015-11-13 MED ORDER — SERTRALINE HCL 50 MG PO TABS
25.0000 mg | ORAL_TABLET | Freq: Every day | ORAL | Status: DC
  Administered 2015-11-13 – 2015-11-21 (×8): 25 mg via ORAL
  Filled 2015-11-13 (×8): qty 1

## 2015-11-13 MED ORDER — HEPARIN (PORCINE) IN NACL 100-0.45 UNIT/ML-% IJ SOLN
1100.0000 [IU]/h | INTRAMUSCULAR | Status: DC
  Administered 2015-11-13 (×2): 1100 [IU]/h via INTRAVENOUS
  Filled 2015-11-13 (×3): qty 250

## 2015-11-13 MED ORDER — ACETAMINOPHEN 650 MG RE SUPP: 650.0000 mg | Freq: Four times a day (QID) | RECTAL | Status: DC | PRN

## 2015-11-13 MED ORDER — TRAMADOL HCL 50 MG PO TABS: 50.0000 mg | ORAL_TABLET | Freq: Two times a day (BID) | ORAL | Status: DC | PRN

## 2015-11-13 MED ORDER — MOMETASONE FURO-FORMOTEROL FUM 200-5 MCG/ACT IN AERO
2.0000 | INHALATION_SPRAY | Freq: Two times a day (BID) | RESPIRATORY_TRACT | Status: DC
  Administered 2015-11-13 – 2015-11-21 (×9): 2 via RESPIRATORY_TRACT
  Filled 2015-11-13: qty 8.8

## 2015-11-13 MED ORDER — METOPROLOL TARTRATE 25 MG PO TABS
25.0000 mg | ORAL_TABLET | Freq: Two times a day (BID) | ORAL | Status: DC
  Administered 2015-11-13 – 2015-11-15 (×3): 25 mg via ORAL
  Filled 2015-11-13 (×4): qty 1

## 2015-11-13 MED ORDER — ONDANSETRON HCL 4 MG/2ML IJ SOLN
4.0000 mg | Freq: Four times a day (QID) | INTRAMUSCULAR | Status: DC | PRN
  Administered 2015-11-13 – 2015-11-16 (×8): 4 mg via INTRAVENOUS
  Filled 2015-11-13 (×9): qty 2

## 2015-11-13 MED ORDER — ONDANSETRON HCL 4 MG/2ML IJ SOLN
4.0000 mg | Freq: Once | INTRAMUSCULAR | Status: AC
  Administered 2015-11-13: 4 mg via INTRAVENOUS
  Filled 2015-11-13: qty 2

## 2015-11-13 NOTE — ED Notes (Signed)
Bladder scan 349ml

## 2015-11-13 NOTE — ED Notes (Signed)
Dr. Roel Cluck made aware of abnormal Troponin. No additional orders recieved

## 2015-11-13 NOTE — Progress Notes (Signed)
ANTICOAGULATION CONSULT NOTE - Initial Consult  Pharmacy Consult for Heparin  Indication: atrial fibrillation  Allergies  Allergen Reactions  . Sulfa Antibiotics Hives   Patient Measurements: Height: 5\' 2"  (157.5 cm) Weight: 258 lb (117.028 kg) IBW/kg (Calculated) : 50.1  Vital Signs: BP: 133/84 mmHg (04/18 0115) Pulse Rate: 111 (04/18 0115)  Labs:  Recent Labs  11/12/15 1619 11/12/15 2330  HGB 13.0  --   HCT 39.2  --   PLT 219  --   CREATININE 2.56* 2.37*  TROPONINI  --  0.11*   Estimated Creatinine Clearance: 23.4 mL/min (by C-G formula based on Cr of 2.37).  Medical History: Past Medical History  Diagnosis Date  . CHF (congestive heart failure) ([place])   . Hypertension    Assessment: Heparin for afib, no anti-coagulants PTA, CBC good, D-Dimer is markedly elevated, noted renal dysfunction, other labs as above.   Goal of Therapy:  Heparin level 0.3-0.7 units/ml Monitor platelets by anticoagulation protocol: Yes   Plan:  -Heparin 4000 units BOLUS -Start heparin drip at 1100 units/hr -1200 HL -Daily CBC/HL -Monitor for bleeding  Narda Bonds 11/13/2015,3:45 AM

## 2015-11-13 NOTE — ED Notes (Signed)
Ordered chicken noodle soup for patient.

## 2015-11-13 NOTE — ED Notes (Signed)
Cancelled US paracentesis per Dr. Wyline Copas.

## 2015-11-13 NOTE — ED Notes (Signed)
Pt had an episode of emesis. Zofran PRN not due until 1130. MD paged for further orders.

## 2015-11-13 NOTE — Progress Notes (Signed)
PROGRESS NOTE    Kendra Martinez  [id number] DOB: 24-Sep-1933 DOA: 11/12/2015 PCP: No PCP Per Patient Unknown - will clarify in AM Outpatient Specialists:     Brief Narrative:  82 y.o. female w/ past medical history of CHF (congestive heart failure) ([place]) and Hypertension who presented with worsening shortness of breath ever since her discharged from back to [facility] where she was diagnosed with diastolic heart failure and right pleural effusion. She was eventually diagnosed with pneumonia was treated with cephalexin and levaquin with some improvement and discharged to home on 04/08    Assessment & Plan:   Active Problems:   Pleural effusion   Essential hypertension   Hyponatremia   Sepsis ([place])   Anasarca   New onset a-fib (HCC)   Elevated troponin   . Pleural effusion discussed with pulmonology. Pulm consulted. Recs for no need for urgent thoracentesis at this time. Rec for diuresis given concern for possible CHF exacerbation.  . Essential hypertension - BP currently stable . Sepsis ([place]) - meeting sepsis criteria with tachycardia and elevated white blood cell count and increased respiratory rate. Source unclear could be possibly secondary to underlying pulmonary infection. Will continue broad-spectrum antibiotics.  . Anasarca in a setting of renal insufficiency and history of diastolic heart failure. Patient initially hypotensive was given IV fluids in the emergency department. Cardiology following. Need to be careful w/ diuresis given renal function  . New onset a-fib [facility]) - likely contributing to CHF. Repeat echogram pending given new diagnosis. CHA2D vas2c score is 3. Started heparin gtt, likely need coumadin on discharge . Elevated troponin - in a setting of hypoxia and increased demand. Cardiology consulted Ascites - large ascites seen on imaging. Suspect possible CHF. Given ARF, will hold off on paracentesis at this time to avoid sudden fluid shifts   DVT prophylaxis:  Heparin Code Status: Full Family Communication: Pt in room, family at bedside Disposition Plan: Admit to SDU - still in ED   Consultants:   Pulmonary  Cardiology  Procedures:    Antimicrobials:   Vancomycin 4/18>>>  Zosyn 4/18>>>    Subjective: Still reports sob and "fullness." No other complaints  Objective: Filed Vitals:   11/13/15 1130 11/13/15 1200 11/13/15 1230 11/13/15 1300  BP: 106/52 114/77 112/75 115/95  Pulse: 99 94 97 107  Temp:      TempSrc:      Resp: 22 22 21 21   Height:      Weight:      SpO2: 97% 98% 98% 100%    Intake/Output Summary (Last 24 hours) at 11/13/15 1322 Last data filed at 11/13/15 1208  Gross per 24 hour  Intake   1203 ml  Output      0 ml  Net   1203 ml   Filed Weights   11/12/15 2005  Weight: 117.028 kg (258 lb)    Examination:  General exam: Appears calm and comfortable  Respiratory system: Clear to auscultation. Respiratory effort normal. Cardiovascular system: S1 & S2 heard, RRR. Generalized edema. Gastrointestinal system: Abdomen ascitic, nontender.  Normal bowel sounds heard. Central nervous system: Alert and oriented. No focal neurological deficits. Extremities: Symmetric 5 x 5 power. Skin: No rashes, lesions or ulcers Psychiatry: Judgement and insight appear normal. Mood & affect appropriate.     Data Reviewed: I have personally reviewed following labs and imaging studies  CBC:  Recent Labs Lab 11/12/15 1619 11/13/15 0440  WBC 22.3* 18.9*  NEUTROABS 19.9*  --   HGB 13.0 12.5  HCT 39.2 37.9  MCV 92.7 92.7  PLT 219 [id number]   Basic Metabolic Panel:  Recent Labs Lab 11/12/15 1619 11/12/15 2330 11/13/15 0440  NA 131* 133* 132*  K 4.6 4.4 4.1  CL 93* 97* 96*  CO2 21* 22 19*  GLUCOSE 124* 97 116*  BUN 54* 52* 52*  CREATININE 2.56* 2.37* 2.47*  CALCIUM 8.8* 7.9* 8.0*  MG  --   --  1.6*  PHOS  --   --  3.9   GFR: Estimated Creatinine Clearance: 22.4 mL/min (by C-G formula based on Cr of  2.47). Liver Function Tests:  Recent Labs Lab 11/12/15 2330 11/13/15 0440  AST 21 25  ALT 15 18  ALKPHOS 55 59  BILITOT 1.1 1.4*  PROT 5.2* 5.3*  ALBUMIN 2.2* 2.3*   No results for input(s): LIPASE, AMYLASE in the last 168 hours. No results for input(s): AMMONIA in the last 168 hours. Coagulation Profile:  Recent Labs Lab 11/13/15 0440  INR 1.59*   Cardiac Enzymes:  Recent Labs Lab 11/12/15 2330 11/13/15 0440 11/13/15 1120  TROPONINI 0.11* 0.08* 0.07*   BNP (last 3 results) No results for input(s): PROBNP in the last 8760 hours. HbA1C: No results for input(s): HGBA1C in the last 72 hours. CBG: No results for input(s): GLUCAP in the last 168 hours. Lipid Profile: No results for input(s): CHOL, HDL, LDLCALC, TRIG, CHOLHDL, LDLDIRECT in the last 72 hours. Thyroid Function Tests:  Recent Labs  11/13/15 0440  TSH 3.124   Anemia Panel: No results for input(s): VITAMINB12, FOLATE, FERRITIN, TIBC, IRON, RETICCTPCT in the last 72 hours. Urine analysis:    Component Value Date/Time   COLORURINE YELLOW 11/13/2015 1132   APPEARANCEUR CLOUDY* 11/13/2015 1132   LABSPEC 1.023 11/13/2015 1132   PHURINE 5.5 11/13/2015 1132   GLUCOSEU NEGATIVE 11/13/2015 1132   HGBUR NEGATIVE 11/13/2015 1132   BILIRUBINUR MODERATE* 11/13/2015 1132   KETONESUR NEGATIVE 11/13/2015 1132   PROTEINUR 30* 11/13/2015 1132   NITRITE NEGATIVE 11/13/2015 1132   LEUKOCYTESUR MODERATE* 11/13/2015 1132   Sepsis Labs: @LABRCNTIP (procalcitonin:4,lacticidven:4)  ) Recent Results (from the past 240 hour(s))  Blood culture (routine x 2)     Status: None (Preliminary result)   Collection Time: 11/12/15  8:20 PM  Result Value Ref Range Status   Specimen Description BLOOD RIGHT ANTECUBITAL  Final   Special Requests IN PEDIATRIC BOTTLE 3CC  Final   Culture NO GROWTH < 24 HOURS  Final   Report Status PENDING  Incomplete  Blood culture (routine x 2)     Status: None (Preliminary result)    Collection Time: 11/12/15  8:25 PM  Result Value Ref Range Status   Specimen Description BLOOD RIGHT HAND  Final   Special Requests IN PEDIATRIC BOTTLE 3CC  Final   Culture NO GROWTH < 24 HOURS  Final   Report Status PENDING  Incomplete         Radiology Studies: Dg Chest 2 View  11/12/2015  CLINICAL DATA:  Shortness of breath for 2 weeks. Recent diagnosis of pneumonia. EXAM: CHEST  2 VIEW COMPARISON:  None. FINDINGS: Heart size and pulmonary vascularity are normal. There is calcification in the arch of the aorta. There is a moderate right pleural effusion. Left lung is clear. No acute bone abnormality. Degenerative changes of both shoulders with a large calcified loose body in the right shoulder joint in the subcoracoid recess. IMPRESSION: Moderate right pleural effusion.  Aortic atherosclerosis. Electronically Signed   By: Lorriane Shire M.D.  On: 11/12/2015 16:09   Dg Abd 1 View  11/13/2015  CLINICAL DATA:  Abdominal distention EXAM: ABDOMEN - 1 VIEW COMPARISON:  None. FINDINGS: Mild gastric distention. Nonobstructive bowel gas pattern. Cholecystectomy clips. Degenerative changes of the visualized thoracolumbar spine. IMPRESSION: Unremarkable abdominal radiograph. Electronically Signed   By: Julian Hy M.D.   On: 11/13/2015 00:04   Ct Chest Wo Contrast  11/12/2015  CLINICAL DATA:  Shortness of breath, 2 weeks duration. Treated for pneumonia. Pleural effusion. EXAM: CT CHEST WITHOUT CONTRAST TECHNIQUE: Multidetector CT imaging of the chest was performed following the standard protocol without IV contrast. COMPARISON:  Radiography same day FINDINGS: There is a moderate to large pleural effusion on the right layering dependently. There is dependent atelectasis in the right lung. The aerated portion of the right lung is clear except for minimal atelectasis or scarring in the middle lobe. No pleural effusion on the left. There is minimal atelectasis at the left base. The left lung is  otherwise clear. No underlying emphysema. No mediastinal or hilar mass or lymphadenopathy. There is atherosclerosis of the aorta and extensive coronary artery calcification. Scans in the upper abdomen show a large amount of ascites. There are at least 2 large liver cysts, not completely evaluated. The larger cyst at the junction of the right and left lobes measures up to 11 cm in diameter. Right lobe cyst partially imaged measures up to 7 cm in diameter. IMPRESSION: Right pleural effusion layering dependently with dependent pulmonary atelectasis. The aerated lung does not show evidence of pre-existing lung disease or active pneumonia. Atherosclerosis of the aorta and the coronary arteries. Large amount of ascites. Two large liver cysts. The abdomen was not completely evaluated. Electronically Signed   By: Nelson Chimes M.D.   On: 11/12/2015 18:30   US Abdomen Limited Ruq  11/13/2015  CLINICAL DATA:  Abdominal distention for 1 month EXAM: LIMITED ABDOMEN ULTRASOUND FOR ASCITES TECHNIQUE: Limited ultrasound survey for ascites was performed in all four abdominal quadrants. COMPARISON:  None. FINDINGS: There is moderate generalized ascites. There are loops of surrounding peristalsing bowel. IMPRESSION: Moderate generalized ascites. Electronically Signed   By: Lowella Grip III M.D.   On: 11/13/2015 09:22        Scheduled Meds: . fluconazole (DIFLUCAN) IV  100 mg Intravenous Q24H  . magic mouthwash  5 mL Oral TID  . metoprolol tartrate  25 mg Oral BID  . mometasone-formoterol  2 puff Inhalation BID  . ondansetron (ZOFRAN) IV  4 mg Intravenous Once  . pantoprazole  40 mg Oral Daily  . sertraline  25 mg Oral Daily  . sodium chloride flush  3 mL Intravenous Q12H  . sodium chloride flush  3 mL Intravenous Q12H   Continuous Infusions: . sodium chloride    . heparin 1,100 Units/hr (11/13/15 0421)  . piperacillin-tazobactam (ZOSYN)  IV Stopped (11/13/15 1004)  . [START ON 11/14/2015] vancomycin        LOS: 1 day     CHIU, STEPHEN K, MD [facility] Pager [phone]  If 7PM-7AM, please contact night-coverage [email or web address] Password [email or web address] 11/13/2015, 1:22 PM

## 2015-11-13 NOTE — ED Notes (Signed)
Ordered diet tray 

## 2015-11-13 NOTE — Progress Notes (Signed)
[place] for Heparin  Indication: atrial fibrillation  Allergies  Allergen Reactions  . Sulfa Antibiotics Hives   Patient Measurements: Height: 5\' 2"  (157.5 cm) Weight: 258 lb (117.028 kg) IBW/kg (Calculated) : 50.1  Vital Signs: BP: 114/77 mmHg (04/18 1200) Pulse Rate: 94 (04/18 1200)  Labs:  Recent Labs  11/12/15 1619 11/12/15 2330 11/13/15 0440 11/13/15 1120  HGB 13.0  --  12.5  --   HCT 39.2  --  37.9  --   PLT 219  --  217  --   APTT  --   --  122*  --   LABPROT  --   --  19.0*  --   INR  --   --  1.59*  --   HEPARINUNFRC  --   --   --  0.48  CREATININE 2.56* 2.37* 2.47*  --   TROPONINI  --  0.11* 0.08* 0.07*   Estimated Creatinine Clearance: 22.4 mL/min (by C-G formula based on Cr of 2.47).  Medical History: Past Medical History  Diagnosis Date  . CHF (congestive heart failure) ([place])   . Hypertension    Assessment: 64 yof presented continues on IV heparin for afib. Initial heparin level is therapeutic at 0.48. No bleeding noted. H/H and platelets are WNL.   Goal of Therapy:  Heparin level 0.3-0.7 units/ml Monitor platelets by anticoagulation protocol: Yes   Plan:  - Continue heparin gtt 1100 units/hr - Check a heparin level tonight to confirm - Daily heparin level and CBC  Salome Arnt, PharmD, BCPS Pager # [phone] 11/13/2015 12:57 PM

## 2015-11-13 NOTE — ED Notes (Signed)
CCM/Pulmonology at bedside.

## 2015-11-13 NOTE — Consult Note (Signed)
PULMONARY / CRITICAL CARE MEDICINE   Name: Kendra Martinez MRN: [id number] DOB: [birth year]    ADMISSION DATE:  11/12/2015 CONSULTATION DATE: 4/18  REFERRING MD:  [facility]  CHIEF COMPLAINT: SOB  HISTORY OF PRESENT ILLNESS:   82 yo wf, never smoker, just discharged from [facility] 4/8 after being treated for CHF, KlebP pna, Klep P uti. She comes to [facility] (moved to [place]) with CC of SOB, no fever, chills or sweats, + white "slimey sputum"and abd pain. Evaluation reveals rt pleural effusion(no x rays to compare) ELEVATED WBC, new Afib. She was started on heparin and PCCM asked to evaluate rt effusion. No urgent ned for thoracentesis in setting of CHF.  PAST MEDICAL HISTORY :  She  has a past medical history of CHF (congestive heart failure) ([place]) and Hypertension.  PAST SURGICAL HISTORY: She  has no past surgical history on file.  Allergies  Allergen Reactions  . Sulfa Antibiotics Hives    No current facility-administered medications on file prior to encounter.   No current outpatient prescriptions on file prior to encounter.    FAMILY HISTORY:  Her indicated that her mother is deceased. She indicated that her father is deceased.   SOCIAL HISTORY: She  reports that she has never smoked. She does not have any smokeless tobacco history on file. She reports that she does not drink alcohol.  REVIEW OF SYSTEMS:   10 point review of system taken, please see HPI for positives and negatives.   SUBJECTIVE:  NAD at rest  VITAL SIGNS: BP 109/64 mmHg  Pulse 87  Temp(Src) 98 F (36.7 C) (Oral)  Resp 22  Ht 5\' 2"  (1.575 m)  Wt 258 lb (117.028 kg)  BMI 47.18 kg/m2  SpO2 94%  HEMODYNAMICS:    VENTILATOR SETTINGS:    INTAKE / OUTPUT: I/O last 3 completed shifts: In: 1200 [I.V.:1200] Out: -   PHYSICAL EXAMINATION: General: MOWF NAD at rest, hoarse voice":I have laryngitis " Neuro:  Intact, MAE x 4, lip speaks well HEENT:  Oral pharynx  Inflamed, no jvd Cardiovascular:  HSIR IR a  fib V R 98 Lungs:  Decreased bs bases, no increased wob Abdomen: obese,non tender + bs Musculoskeletal:  intact Skin:  Lower ext +++ edema  LABS:  BMET  Recent Labs Lab 11/12/15 1619 11/12/15 2330 11/13/15 0440  NA 131* 133* 132*  K 4.6 4.4 4.1  CL 93* 97* 96*  CO2 21* 22 19*  BUN 54* 52* 52*  CREATININE 2.56* 2.37* 2.47*  GLUCOSE 124* 97 116*    Electrolytes  Recent Labs Lab 11/12/15 1619 11/12/15 2330 11/13/15 0440  CALCIUM 8.8* 7.9* 8.0*  MG  --   --  1.6*  PHOS  --   --  3.9    CBC  Recent Labs Lab 11/12/15 1619 11/13/15 0440  WBC 22.3* 18.9*  HGB 13.0 12.5  HCT 39.2 37.9  PLT 219 217    Coag's  Recent Labs Lab 11/13/15 0440  APTT 122*  INR 1.59*    Sepsis Markers  Recent Labs Lab 11/12/15 1703 11/12/15 2340 11/13/15 0440  LATICACIDVEN 2.26* 1.2 1.5  PROCALCITON  --   --  0.65    ABG  Recent Labs Lab 11/12/15 1549  PHART 7.412  PCO2ART 36.0  PO2ART 69.0*    Liver Enzymes  Recent Labs Lab 11/12/15 2330 11/13/15 0440  AST 21 25  ALT 15 18  ALKPHOS 55 59  BILITOT 1.1 1.4*  ALBUMIN 2.2* 2.3*    Cardiac Enzymes  Recent Labs Lab 11/12/15 2330 11/13/15 0440  TROPONINI 0.11* 0.08*    Glucose No results for input(s): GLUCAP in the last 168 hours.  Imaging Dg Chest 2 View  11/12/2015  CLINICAL DATA:  Shortness of breath for 2 weeks. Recent diagnosis of pneumonia. EXAM: CHEST  2 VIEW COMPARISON:  None. FINDINGS: Heart size and pulmonary vascularity are normal. There is calcification in the arch of the aorta. There is a moderate right pleural effusion. Left lung is clear. No acute bone abnormality. Degenerative changes of both shoulders with a large calcified loose body in the right shoulder joint in the subcoracoid recess. IMPRESSION: Moderate right pleural effusion.  Aortic atherosclerosis. Electronically Signed   By: Lorriane Shire M.D.   On: 11/12/2015 16:09   Dg Abd 1 View  11/13/2015  CLINICAL DATA:  Abdominal  distention EXAM: ABDOMEN - 1 VIEW COMPARISON:  None. FINDINGS: Mild gastric distention. Nonobstructive bowel gas pattern. Cholecystectomy clips. Degenerative changes of the visualized thoracolumbar spine. IMPRESSION: Unremarkable abdominal radiograph. Electronically Signed   By: Julian Hy M.D.   On: 11/13/2015 00:04   Ct Chest Wo Contrast  11/12/2015  CLINICAL DATA:  Shortness of breath, 2 weeks duration. Treated for pneumonia. Pleural effusion. EXAM: CT CHEST WITHOUT CONTRAST TECHNIQUE: Multidetector CT imaging of the chest was performed following the standard protocol without IV contrast. COMPARISON:  Radiography same day FINDINGS: There is a moderate to large pleural effusion on the right layering dependently. There is dependent atelectasis in the right lung. The aerated portion of the right lung is clear except for minimal atelectasis or scarring in the middle lobe. No pleural effusion on the left. There is minimal atelectasis at the left base. The left lung is otherwise clear. No underlying emphysema. No mediastinal or hilar mass or lymphadenopathy. There is atherosclerosis of the aorta and extensive coronary artery calcification. Scans in the upper abdomen show a large amount of ascites. There are at least 2 large liver cysts, not completely evaluated. The larger cyst at the junction of the right and left lobes measures up to 11 cm in diameter. Right lobe cyst partially imaged measures up to 7 cm in diameter. IMPRESSION: Right pleural effusion layering dependently with dependent pulmonary atelectasis. The aerated lung does not show evidence of pre-existing lung disease or active pneumonia. Atherosclerosis of the aorta and the coronary arteries. Large amount of ascites. Two large liver cysts. The abdomen was not completely evaluated. Electronically Signed   By: Nelson Chimes M.D.   On: 11/12/2015 18:30     STUDIES:  4/18 2 d>>  CULTURES: 4/18 bc>> 4/18 uc>>  ANTIBIOTICS: 4/18  diflucan>> 4/18 vanc>> 4/18 Zoysn>>  SIGNIFICANT EVENTS:   LINES/TUBES:   DISCUSSION: MOWF with CHF and FTT  ASSESSMENT / PLAN:  PULMONARY A: Recent dx of asthma Recent dx 4/8 of Kleb P Pna Rt pleural effusion known from previous admit at [place]:   BD as ordered, Question dx of asthma  Abx per IM No thoracentesis at this time, suspect ill improve when dry Follow serial c x r  CARDIOVASCULAR A:  Dx with CHF at [facility]. No 2 d available, bnp 470 New onset a fib P:  2 d for eval of chf Diuresis per IM ? Cards consult  RENAL Lab Results  Component Value Date   CREATININE 2.47* 11/13/2015   CREATININE 2.37* 11/12/2015   CREATININE 2.56* 11/12/2015    A:   Renal Insuff P:   Follow creatine  Careful with diuresis  GASTROINTESTINAL A:   Nausea and abd discomfort with neg exam P:   Abd film non diagnostic  HEMATOLOGIC A:   No acute issue P:  Monitor h/h  INFECTIOUS A:   Dx with recent Kleb P. Pna,  Dx 4/8 on levaquin WBC 18.9, procal 0.65 P:   V/z per IM team  ENDOCRINE A:   No acute issue   P:   Check TSH in new onset a fib  NEUROLOGIC A:   Intact rass 0 P:   RASS goal:0 Careful with sedation   FAMILY  - Updates: Husband and patient updated  at bedside.  - Inter-disciplinary family meet or Palliative Care meeting due by:  day [place] ACNP Maryanna Shape PCCM Pager [phone] till 3 pm If no answer page [phone] 11/13/2015, 7:59 AM

## 2015-11-13 NOTE — Progress Notes (Signed)
[place] for Heparin  Indication: atrial fibrillation  Allergies  Allergen Reactions  . Sulfa Antibiotics Hives   Patient Measurements: Height: 5\' 3"  (160 cm) Weight: 264 lb 12.4 oz (120.1 kg) IBW/kg (Calculated) : 52.4  Vital Signs: Temp: 97.6 F (36.4 C) (04/18 1644) BP: 117/64 mmHg (04/18 1644) Pulse Rate: 109 (04/18 1644)  Labs:  Recent Labs  11/12/15 1619 11/12/15 2330 11/13/15 0440 11/13/15 1120 11/13/15 1821  HGB 13.0  --  12.5  --   --   HCT 39.2  --  37.9  --   --   PLT 219  --  217  --   --   APTT  --   --  122*  --   --   LABPROT  --   --  19.0*  --   --   INR  --   --  1.59*  --   --   HEPARINUNFRC  --   --   --  0.48 0.58  CREATININE 2.56* 2.37* 2.47*  --   --   TROPONINI  --  0.11* 0.08* 0.07*  --    Estimated Creatinine Clearance: 23.2 mL/min (by C-G formula based on Cr of 2.47).  Medical History: Past Medical History  Diagnosis Date  . CHF (congestive heart failure) ([place])   . Hypertension   . Pleural effusion 10/2015  . Shortness of breath dyspnea   . Bipolar disorder ([place])   . GERD (gastroesophageal reflux disease)   . Ascites   . Arthritis     oa in knees   Assessment: 15 yof presented continues on IV heparin for afib. Initial heparin level is therapeutic at 0.48. No bleeding noted. H/H and platelets are WNL.   2nd heparin level within range  Goal of Therapy:  Heparin level 0.3-0.7 units/ml Monitor platelets by anticoagulation protocol: Yes   Plan:  - Continue heparin gtt 1100 units/hr - Daily heparin level and CBC  Thank you Anette Guarneri, PharmD [phone]  11/13/2015 7:55 PM

## 2015-11-13 NOTE — Consult Note (Signed)
CARDIOLOGY CONSULT NOTE   Patient ID: Kendra Martinez MRN: [id number], DOB/AGE: [birth year]   Admit date: 11/12/2015 Date of Consult: 11/13/2015   Primary Physician: No PCP Per Patient Primary Cardiologist: new   Pt. Profile  Kendra Martinez is a 82 year old morbidly obese Caucasian female with past medical history of HTN, chronic diastolic heart failure, history of right sided pleural effusion, and renal insufficiency of unknown chronicity presented with increasing edema, weakness, shortness of breath, nausea. She was found to have new onset of A. Fib, currently right pleural effusion and a large ascites  Problem List  Past Medical History  Diagnosis Date  . CHF (congestive heart failure) ([place])   . Hypertension     Past Surgical History  Procedure Laterality Date  . Cholecystectomy       Allergies  Allergies  Allergen Reactions  . Sulfa Antibiotics Hives    HPI   Kendra Martinez is a 82 year old morbidly obese Caucasian female with past medical history of HTN, chronic diastolic heart failure, history of right sided pleural effusion, and renal insufficiency of unknown chronicity. Per family, patient was recently seen at [facility] in [place]. According to outside H&P on 10/29/2015, patient had a chief complaint of shortness of breath on arrival. She was last in her usual state of health around summertime of last year. By Christmas 2016, she had worsening dyspnea on exertion and fatigue. And her symptom has been persistent since. Also he noticed increasing abdominal girth. Her primary care physician time obtained a CT angiogram of the chest as well as a CT scan of abdomen on 2/8 and pelvis which revealed she had right-sided pleural effusion, but no evidence of PE in chest nor was there free fluid in abdomen. She was initially treated for asthma, however her symptoms did not improve, her PCP who referred her to the hospital at that time for worsening symptom. On  arrival at [facility], her BNP was elevated at 4133, sodium 134, potassium 3.1, creatinine was 2.5 to. TSH was normal. White blood cell count was 11.9. Chest x-ray demonstrated a right-sided pleural effusion. Due to elevated creatinine, it was felt she likely had acute kidney injury, however there is no mention of was her baseline prior to that. Her HCTZ was discontinued and she was placed on IV Lasix. During that hospitalization, her creatinine trended up to 3.03 on 4/6 before trending back down. On 4/8 her creatinine was 2.04. She was also diagnosed with Klebsiella pneumonia UTI. During the hospitalization, she had abdominal ultrasound done on 4/4 which showed minimal free fluid identified in all 4 quadrant of abdomen, liver is normal but contain multiple cysts, the largest measuring 10.8 x 8.9 x 7.3 cm in diameter.  Previous PFT performed on 10/17/2015 showed FEV1 75%, FEV1 to FVC ratio 63%, diffusion capacity 52% predicted, mild obstructive lung defect, mild restrictive lung defect, moderate decrease in diffusion capacity, 13% response in FIVC and 6% response in small airway with bronchodilator. Patient will benefit from bronchodilator treatment. Previous EKG obtained on 11/02/2014 showed normal sinus rhythm. Echocardiogram performed on 4/3 showed EF 55-60%, mild left atrial enlargement, sclerotic aortic valve without significant, mild concentric LVH.  Per patient, on discharge she was only given 7 days of Lasix which she has completed since that time. Although discharge paper says she has received 30 tablet, she says she actually only got 7. She had worsening lower extremity edema, shortness of breath and weakness. She was later relocated to [facility] area  and brought to [facility] ED on 11/13/2015 for further evaluation. On arrival, she had 3+ lower extremity pitting edema. Significant laboratory finding on arrival include sodium 131, creatinine 2.56, BNP 470.9, albumin very low 2.2. White blood  cell count 22.3. Lactic acid 2.26. D-dimer 18.12. TSH normal. On arrival, she was in atrial fibrillation with heart rate 106, T wave inversion in inferolateral leads which is new compared to the previous EKG from outside hospital. She denies ever having any chest pain. Point-of-care troponin was normal, however serial troponin was minimally elevated at 0.11. Chest x-ray showed moderate right pleural effusion. Cardiology has been consulted for new onset of atrial fibrillation and heart failure. CT of the chest without contrast was obtained due to elevated creatinine, it showed large amount of ascites, 2 large liver cysts.    Inpatient Medications  . fluconazole (DIFLUCAN) IV  100 mg Intravenous Q24H  . magic mouthwash  5 mL Oral TID  . metoprolol tartrate  25 mg Oral BID  . mometasone-formoterol  2 puff Inhalation BID  . ondansetron (ZOFRAN) IV  4 mg Intravenous Once  . pantoprazole  40 mg Oral Daily  . sertraline  25 mg Oral Daily  . sodium chloride flush  3 mL Intravenous Q12H  . sodium chloride flush  3 mL Intravenous Q12H    Family History Family History  Problem Relation Age of Onset  . Kidney cancer Mother   . COPD Father   . Diabetes type II Other   . Stroke Neg Hx      Social History Social History   Social History  . Marital Status: Married    Spouse Name: N/A  . Number of Children: N/A  . Years of Education: N/A   Occupational History  . Not on file.   Social History Main Topics  . Smoking status: Never Smoker   . Smokeless tobacco: Not on file  . Alcohol Use: No  . Drug Use: No  . Sexual Activity: Not on file   Other Topics Concern  . Not on file   Social History Narrative     Review of Systems  General:  No chills, fever, night sweats or weight changes.  Cardiovascular:  No chest pain, palpitations. + dyspnea on exertion, edema Dermatological: No rash, lesions/masses Respiratory: +cough, dyspnea Urologic: No hematuria, dysuria Abdominal:   No  vomiting, diarrhea, bright red blood per rectum, melena, or hematemesis +nausea Neurologic:  No visual changes, changes in mental status. +wkns All other systems reviewed and are otherwise negative except as noted above.  Physical Exam  Blood pressure 119/73, pulse 96, temperature 98 F (36.7 C), temperature source Oral, resp. rate 21, height 5\' 2"  (1.575 m), weight 258 lb (117.028 kg), SpO2 96 %.  General: Pleasant, NAD Psych: Normal affect. Neuro: Alert and oriented X 3. Moves all extremities spontaneously. HEENT: Normal  Neck: Supple without bruits +JVD. Lungs:  Resp regular and unlabored. On nasal cannula oxygen, markedly diminished breath sounds in right base, clear to auscultation on the left side. Heart: Irregularly irregular no s3, s4, or murmurs. Abdomen: Soft, non-tender, non-distended, BS + x 4.  Extremities: No clubbing, cyanosis. DP/PT/Radials 2+ and equal bilaterally. 3+ pitting edema  Labs   Recent Labs  11/12/15 2330 11/13/15 0440  TROPONINI 0.11* 0.08*   Lab Results  Component Value Date   WBC 18.9* 11/13/2015   HGB 12.5 11/13/2015   HCT 37.9 11/13/2015   MCV 92.7 11/13/2015   PLT 217 11/13/2015  Recent Labs Lab 11/13/15 0440  NA 132*  K 4.1  CL 96*  CO2 19*  BUN 52*  CREATININE 2.47*  CALCIUM 8.0*  PROT 5.3*  BILITOT 1.4*  ALKPHOS 59  ALT 18  AST 25  GLUCOSE 116*   No results found for: CHOL, HDL, LDLCALC, TRIG Lab Results  Component Value Date   DDIMER 18.12* 11/12/2015    Radiology/Studies  Dg Chest 2 View  11/12/2015  CLINICAL DATA:  Shortness of breath for 2 weeks. Recent diagnosis of pneumonia. EXAM: CHEST  2 VIEW COMPARISON:  None. FINDINGS: Heart size and pulmonary vascularity are normal. There is calcification in the arch of the aorta. There is a moderate right pleural effusion. Left lung is clear. No acute bone abnormality. Degenerative changes of both shoulders with a large calcified loose body in the right shoulder joint in  the subcoracoid recess. IMPRESSION: Moderate right pleural effusion.  Aortic atherosclerosis. Electronically Signed   By: Lorriane Shire M.D.   On: 11/12/2015 16:09   Dg Abd 1 View  11/13/2015  CLINICAL DATA:  Abdominal distention EXAM: ABDOMEN - 1 VIEW COMPARISON:  None. FINDINGS: Mild gastric distention. Nonobstructive bowel gas pattern. Cholecystectomy clips. Degenerative changes of the visualized thoracolumbar spine. IMPRESSION: Unremarkable abdominal radiograph. Electronically Signed   By: Julian Hy M.D.   On: 11/13/2015 00:04   Ct Chest Wo Contrast  11/12/2015  CLINICAL DATA:  Shortness of breath, 2 weeks duration. Treated for pneumonia. Pleural effusion. EXAM: CT CHEST WITHOUT CONTRAST TECHNIQUE: Multidetector CT imaging of the chest was performed following the standard protocol without IV contrast. COMPARISON:  Radiography same day FINDINGS: There is a moderate to large pleural effusion on the right layering dependently. There is dependent atelectasis in the right lung. The aerated portion of the right lung is clear except for minimal atelectasis or scarring in the middle lobe. No pleural effusion on the left. There is minimal atelectasis at the left base. The left lung is otherwise clear. No underlying emphysema. No mediastinal or hilar mass or lymphadenopathy. There is atherosclerosis of the aorta and extensive coronary artery calcification. Scans in the upper abdomen show a large amount of ascites. There are at least 2 large liver cysts, not completely evaluated. The larger cyst at the junction of the right and left lobes measures up to 11 cm in diameter. Right lobe cyst partially imaged measures up to 7 cm in diameter. IMPRESSION: Right pleural effusion layering dependently with dependent pulmonary atelectasis. The aerated lung does not show evidence of pre-existing lung disease or active pneumonia. Atherosclerosis of the aorta and the coronary arteries. Large amount of ascites. Two large  liver cysts. The abdomen was not completely evaluated. Electronically Signed   By: Nelson Chimes M.D.   On: 11/12/2015 18:30   US Abdomen Limited Ruq  11/13/2015  CLINICAL DATA:  Abdominal distention for 1 month EXAM: LIMITED ABDOMEN ULTRASOUND FOR ASCITES TECHNIQUE: Limited ultrasound survey for ascites was performed in all four abdominal quadrants. COMPARISON:  None. FINDINGS: There is moderate generalized ascites. There are loops of surrounding peristalsing bowel. IMPRESSION: Moderate generalized ascites. Electronically Signed   By: Lowella Grip III M.D.   On: 11/13/2015 09:22    ECG  Atrial fibrillation with heart rate of 106, T wave inversion in inferolateral leads  ASSESSMENT AND PLAN  1. Massive fluid overload likely combination of acute on chronic diastolic heart failure and hypoalbuminemia  - Also will need a urinalysis to rule out potential nephrotic syndrome given massive fluid  overload and bad kidney.   - ideally need paracentesis and thoracensis, will also need careful diuresis given worsening renal function. If no evidence of nephrotic syndrome and renal function continued to worsen, it may be a sign that she has severe hypoalbuminemia and thus causing the issue. May consider right heart cath as needed to guide further therapy however would like to avoid in this case.  2. Newly diagnosed atrial fibrillation  - CHA2DS2-Vasc score 3 (HF, female, HTN)  - IV heparin, likely will need coumadin prior to discharge, no NOAC given renal issue. Need to coordinate starting coumadin with IM and PCCM to see if she need paracentesis or thoracetesis  3. Recurrent R pleural effusion: per PCCM  4. Large ascites: may need paracentesis  5. Recent Klebsiella pneumonia UTI treated at [facility]  6. Possible sepsis with elevated WBC 22: per IM  Signed, Almyra Deforest, PA-C 11/13/2015, 10:01 AM   I have seen and examined the patient along with Almyra Deforest, PA.  I have reviewed the  chart, notes and new data.  I agree with PA/NP's note.  Key new complaints: severe edema/abdominal distention, dyspnea with minimal movement Key examination changes: anasarca with prominent ascites, AF with (mild) RVR Key new findings / data: creat c/w stage 4 kidney disease, severely elevated BNP, ECG with small inferior Q waves and diffuse ST depression. INR spontaneously 1.6 (also low albumin and minimally elevated bili: hepatic congestion?), coronary calcification on CT chest  PLAN: Severe decompensation of biventricular HF (R>L) with preserved LVEF. Newly recognized atrial fibrillation Acute (on chronic) renal insufficiency Hypoalbuminemia  Aggressive diuresis is indicated, notwithstanding abnormal renal function. Repeat echo. IV heparin until we can clarify direction that liver and kidney function will evolve. She will need long term anticoagulation CHADSVasc score 5 (age 34, CHF, HTN, gender), possibly 6 if one includes the CT evidence of vascular/coronary atherosclerosis. Consider Nephrology consultation. Doubt we can get a good quality renal US or renal artery duplex, but bilateral renal artery stenosis is a consideration.   Sanda Klein, MD, [place] [phone] 11/13/2015, 4:51 PM

## 2015-11-13 NOTE — ED Notes (Signed)
Admitting MD at bedside.

## 2015-11-14 ENCOUNTER — Inpatient Hospital Stay (HOSPITAL_COMMUNITY): Payer: Medicare Other

## 2015-11-14 DIAGNOSIS — I4891 Unspecified atrial fibrillation: Secondary | ICD-10-CM

## 2015-11-14 DIAGNOSIS — I5033 Acute on chronic diastolic (congestive) heart failure: Secondary | ICD-10-CM

## 2015-11-14 LAB — URINE CULTURE: Culture: 60000 — AB

## 2015-11-14 LAB — ECHOCARDIOGRAM COMPLETE
Height: 63 in
WEIGHTICAEL: 4257.6 [oz_av]

## 2015-11-14 LAB — CBC
HCT: 37.1 % (ref 36.0–46.0)
Hemoglobin: 12.1 g/dL (ref 12.0–15.0)
MCH: 30.3 pg (ref 26.0–34.0)
MCHC: 32.6 g/dL (ref 30.0–36.0)
MCV: 93 fL (ref 78.0–100.0)
PLATELETS: 211 10*3/uL (ref 150–400)
RBC: 3.99 MIL/uL (ref 3.87–5.11)
RDW: 14.6 % (ref 11.5–15.5)
WBC: 18.5 10*3/uL — AB (ref 4.0–10.5)

## 2015-11-14 LAB — BASIC METABOLIC PANEL
ANION GAP: 18 — AB (ref 5–15)
BUN: 63 mg/dL — ABNORMAL HIGH (ref 6–20)
CALCIUM: 8.1 mg/dL — AB (ref 8.9–10.3)
CO2: 21 mmol/L — ABNORMAL LOW (ref 22–32)
CREATININE: 2.74 mg/dL — AB (ref 0.44–1.00)
Chloride: 94 mmol/L — ABNORMAL LOW (ref 101–111)
GFR, EST AFRICAN AMERICAN: 18 mL/min — AB (ref >60)
GFR, EST NON AFRICAN AMERICAN: 15 mL/min — AB (ref >60)
GLUCOSE: 109 mg/dL — AB (ref 65–99)
Potassium: 4.4 mmol/L (ref 3.5–5.1)
Sodium: 133 mmol/L — ABNORMAL LOW (ref 135–145)

## 2015-11-14 LAB — HEPARIN LEVEL (UNFRACTIONATED): HEPARIN UNFRACTIONATED: 0.66 [IU]/mL (ref 0.30–0.70)

## 2015-11-14 MED ORDER — ENSURE ENLIVE PO LIQD
237.0000 mL | Freq: Two times a day (BID) | ORAL | Status: DC
  Administered 2015-11-15: 237 mL via ORAL

## 2015-11-14 MED ORDER — HEPARIN (PORCINE) IN NACL 100-0.45 UNIT/ML-% IJ SOLN
1050.0000 [IU]/h | INTRAMUSCULAR | Status: AC
  Administered 2015-11-14: 1050 [IU]/h via INTRAVENOUS
  Filled 2015-11-14 (×2): qty 250

## 2015-11-14 NOTE — Progress Notes (Signed)
ANTICOAGULATION CONSULT NOTE - Follow Up Consult  Pharmacy Consult:  Heparin Indication: atrial fibrillation  Allergies  Allergen Reactions  . Sulfa Antibiotics Hives    Patient Measurements: Height: 5\' 3"  (160 cm) Weight: 266 lb 1.6 oz (120.702 kg) IBW/kg (Calculated) : 52.4 Heparin Dosing Weight: 82 kg  Vital Signs: Temp: 97.9 F (36.6 C) (04/19 0837) Temp Source: Oral (04/19 0837) BP: 85/59 mmHg (04/19 0837) Pulse Rate: 88 (04/19 0837)  Labs:  Recent Labs  11/12/15 1619 11/12/15 2330 11/13/15 0440 11/13/15 1120 11/13/15 1821 11/14/15 0622  HGB 13.0  --  12.5  --   --  12.1  HCT 39.2  --  37.9  --   --  37.1  PLT 219  --  217  --   --  211  APTT  --   --  122*  --   --   --   LABPROT  --   --  19.0*  --   --   --   INR  --   --  1.59*  --   --   --   HEPARINUNFRC  --   --   --  0.48 0.58 0.66  CREATININE 2.56* 2.37* 2.47*  --   --  2.74*  TROPONINI  --  0.11* 0.08* 0.07*  --   --     Estimated Creatinine Clearance: 20.9 mL/min (by C-G formula based on Cr of 2.74).    Assessment: 46 YOF with new-onset AFib to continue on IV heparin.  Heparin level therapeutic and trending up; no bleeding reported.   Goal of Therapy:  Heparin level 0.3-0.7 units/ml Monitor platelets by anticoagulation protocol: Yes  Vanc trough 15-20 mcg/mL    Plan:  - Decrease heparin gtt slightly to 1050 units/hr - Daily HL / CBC - Vanc 1500mg  IV Q48H - Zosyn 3.375gm IV Q8H, 4 hr infusion    Elyce Zollinger D. Mina Marble, PharmD, BCPS Pager:  [phone] 11/14/2015, 8:53 AM

## 2015-11-14 NOTE — Progress Notes (Signed)
PULMONARY / CRITICAL CARE MEDICINE   Name: Kendra Martinez MRN: [id number] DOB: [birth year]    ADMISSION DATE:  11/12/2015 CONSULTATION DATE: 4/18  REFERRING MD:  [facility]  CHIEF COMPLAINT: SOB  HISTORY OF PRESENT ILLNESS:   82 yo wf, never smoker, just discharged from [facility] 4/8 after being treated for CHF, KlebP pna, Klep P uti. She comes to [facility] (moved to [place]) with CC of SOB, no fever, chills or sweats, + white "slimey sputum"and abd pain. Evaluation reveals rt pleural effusion(no x rays to compare) ELEVATED WBC, new Afib. She was started on heparin and PCCM asked to evaluate rt effusion. No urgent ned for thoracentesis in setting of CHF.   SUBJECTIVE:  NAD at rest  VITAL SIGNS: BP 100/59 mmHg  Pulse 81  Temp(Src) 97.6 F (36.4 C) (Oral)  Resp 18  Ht 5\' 3"  (1.6 m)  Wt 266 lb 1.6 oz (120.702 kg)  BMI 47.15 kg/m2  SpO2 96%  HEMODYNAMICS:    VENTILATOR SETTINGS:    INTAKE / OUTPUT: I/O last 3 completed shifts: In: 1828 [P.O.:342; I.V.:1336; IV Piggyback:150] Out: 1700 [Urine:1700]  PHYSICAL EXAMINATION: General: MOWF NAD at rest, hoarse voice":I have laryngitis " Feels better 4/19 Neuro:  Intact, MAE x 4, lip speaks well HEENT:  Oral pharynx  Inflamed, no jvd Cardiovascular:  HSIR IR a fib V R 98 Lungs:  Decreased bs bases, no increased wob at rest , DOE reported Abdomen: obese,non tender + bs Musculoskeletal:  intact Skin:  Lower ext ++ edema  LABS:  BMET  Recent Labs Lab 11/12/15 2330 11/13/15 0440 11/14/15 0622  NA 133* 132* 133*  K 4.4 4.1 4.4  CL 97* 96* 94*  CO2 22 19* 21*  BUN 52* 52* 63*  CREATININE 2.37* 2.47* 2.74*  GLUCOSE 97 116* 109*    Electrolytes  Recent Labs Lab 11/12/15 2330 11/13/15 0440 11/14/15 0622  CALCIUM 7.9* 8.0* 8.1*  MG  --  1.6*  --   PHOS  --  3.9  --     CBC  Recent Labs Lab 11/12/15 1619 11/13/15 0440 11/14/15 0622  WBC 22.3* 18.9* 18.5*  HGB 13.0 12.5 12.1  HCT 39.2 37.9 37.1  PLT 219 217 211     Coag's  Recent Labs Lab 11/13/15 0440  APTT 122*  INR 1.59*    Sepsis Markers  Recent Labs Lab 11/12/15 2340 11/13/15 0440 11/13/15 0734  LATICACIDVEN 1.2 1.5 1.2  PROCALCITON  --  0.65  --     ABG  Recent Labs Lab 11/12/15 1549  PHART 7.412  PCO2ART 36.0  PO2ART 69.0*    Liver Enzymes  Recent Labs Lab 11/12/15 2330 11/13/15 0440  AST 21 25  ALT 15 18  ALKPHOS 55 59  BILITOT 1.1 1.4*  ALBUMIN 2.2* 2.3*    Cardiac Enzymes  Recent Labs Lab 11/12/15 2330 11/13/15 0440 11/13/15 1120  TROPONINI 0.11* 0.08* 0.07*    Glucose No results for input(s): GLUCAP in the last 168 hours.  Imaging Dg Chest 2 View  11/14/2015  CLINICAL DATA:  Acute respiratory failure with hypoxemia. EXAM: CHEST  2 VIEW COMPARISON:  November 12, 2015. FINDINGS: Stable cardiomediastinal silhouette. No pneumothorax is noted. Left lung is clear. Increased right midlung opacity is noted concerning for subsegmental atelectasis mild right pleural effusion remains. Stable presence of calcified loose body in right shoulder area. IMPRESSION: Increased right midlung opacity is noted most consistent with subsegmental atelectasis. Mild right pleural effusion is noted as well. Electronically Signed   By:  Marijo Conception, M.D.   On: 11/14/2015 07:40   US Abdomen Limited Ruq  11/13/2015  CLINICAL DATA:  Abdominal distention for 1 month EXAM: LIMITED ABDOMEN ULTRASOUND FOR ASCITES TECHNIQUE: Limited ultrasound survey for ascites was performed in all four abdominal quadrants. COMPARISON:  None. FINDINGS: There is moderate generalized ascites. There are loops of surrounding peristalsing bowel. IMPRESSION: Moderate generalized ascites. Electronically Signed   By: Lowella Grip III M.D.   On: 11/13/2015 09:22     STUDIES:  4/18 2 d>>  CULTURES: 4/18 bc>> 4/18 uc>>  ANTIBIOTICS: 4/18 diflucan>> 4/18 vanc>> 4/18 Zoysn>>  SIGNIFICANT EVENTS:   LINES/TUBES:   DISCUSSION: MOWF with CHF  and FTT    Intake/Output Summary (Last 24 hours) at 11/14/15 0805 Last data filed at 11/14/15 0600  Gross per 24 hour  Intake    628 ml  Output   1700 ml  Net  -1072 ml   ASSESSMENT / PLAN:  PULMONARY A: Recent dx of asthma Recent dx 4/8 of Kleb P Pna Rt pleural effusion known from previous admit at [facility]:   BD as ordered, Question dx of asthma  Abx per IM No thoracentesis at this time, suspect ill improve when dry, c x r 4/19 with decreased effusion with 1100 cc negative fluid balance 4/19 Follow serial c x r  CARDIOVASCULAR A:  Dx with CHF at [facility]. No 2 d available, bnp 470 New onset a fib P:  2 d for eval of chf Diuresis per IM ? Cards consult  RENAL Lab Results  Component Value Date   CREATININE 2.74* 11/14/2015   CREATININE 2.47* 11/13/2015   CREATININE 2.37* 11/12/2015    A:   Renal Insuff P:   Follow creatine  Careful with diuresis  GASTROINTESTINAL A:   Nausea and abd discomfort with neg exam P:   Abd film non diagnostic  HEMATOLOGIC A:   No acute issue P:  Monitor h/h  INFECTIOUS A:   Dx with recent Kleb P. Pna, Treated with levaquin. On V/Z per  IM WBC 18.9, procal 0.65 P:   V/z per IM team  ENDOCRINE A:   No acute issue   P:   Check TSH in new onset a fib(3.124)  NEUROLOGIC A:   Intact rass 0 P:   RASS goal:0 Careful with sedation   FAMILY  - Updates: Husband and patient updated  at bedside.Chest xray improvement conveyed to both.  - Inter-disciplinary family meet or Palliative Care meeting due by:  day [place] PCCM Pager [phone] till 3 pm If no answer page [phone] 11/14/2015, 8:02 AM

## 2015-11-14 NOTE — Progress Notes (Signed)
[facility] PROGRESS NOTE  Kendra Martinez [id number] DOB: April 05, 1934 DOA: 11/12/2015  PCP: She has recently relocated to this area. Does not have any providers here.  Brief HPI: 82 year old Caucasian female with a past medical history of hypertension, diastolic CHF, recently treated pneumonia at an outside facility, presented with was needing shortness of breath. Patient is new to [place]. She has recently relocated to this area. Does not have any providers here.  Past medical history:  Past Medical History  Diagnosis Date  . CHF (congestive heart failure) ([place])   . Hypertension   . Pleural effusion 10/2015  . Shortness of breath dyspnea   . Bipolar disorder ([place])   . GERD (gastroesophageal reflux disease)   . Ascites   . Arthritis     oa in knees    Consultants: Cardiology. Pulmonology. Nephrology  Procedures:  Transthoracic echocardiogram is pending  Antibiotics: Vancomycin and Zosyn  Subjective: Patient continues to feel short of breath. She does have a hoarse voice ongoing for the last 4 days. Attributes it to sitting in her air conditioned car about 4 days ago while driving to [place]. Denies any chest pain. No nausea or vomiting. No abdominal pain. No diarrhea.  Objective:  Vital Signs  Filed Vitals:   11/14/15 0837 11/14/15 0900 11/14/15 0901 11/14/15 1300  BP: 85/59 101/58  110/69  Pulse: 88 77  80  Temp: 97.9 F (36.6 C)   98 F (36.7 C)  TempSrc: Oral   Oral  Resp: 18 26  18   Height:      Weight:      SpO2: 98% 98% 100% 99%    Intake/Output Summary (Last 24 hours) at 11/14/15 1351 Last data filed at 11/14/15 0900  Gross per 24 hour  Intake    865 ml  Output   1650 ml  Net   -785 ml   Filed Weights   11/12/15 2005 11/13/15 1644 11/14/15 0451  Weight: 117.028 kg (258 lb) 120.1 kg (264 lb 12.4 oz) 120.702 kg (266 lb 1.6 oz)    General appearance: alert, cooperative, appears stated age and no distress. Fatigued. Resp: Diminished air  entry in the right. Dullness to percussion. No definite crackles. Cardio: regular rate and rhythm, S1, S2 normal, no murmur, click, rub or gallop GI: soft, non-tender; bowel sounds normal; no masses,  no organomegaly Extremities: extremities normal, atraumatic, no cyanosis or edema Neurologic: Alert and oriented X 3. No focal deficits.  Lab Results:  Data Reviewed: I have personally reviewed following labs and imaging studies  CBC:  Recent Labs Lab 11/12/15 1619 11/13/15 0440 11/14/15 0622  WBC 22.3* 18.9* 18.5*  NEUTROABS 19.9*  --   --   HGB 13.0 12.5 12.1  HCT 39.2 37.9 37.1  MCV 92.7 92.7 93.0  PLT 219 217 [id number]   Basic Metabolic Panel:  Recent Labs Lab 11/12/15 1619 11/12/15 2330 11/13/15 0440 11/14/15 0622  NA 131* 133* 132* 133*  K 4.6 4.4 4.1 4.4  CL 93* 97* 96* 94*  CO2 21* 22 19* 21*  GLUCOSE 124* 97 116* 109*  BUN 54* 52* 52* 63*  CREATININE 2.56* 2.37* 2.47* 2.74*  CALCIUM 8.8* 7.9* 8.0* 8.1*  MG  --   --  1.6*  --   PHOS  --   --  3.9  --    GFR: Estimated Creatinine Clearance: 20.9 mL/min (by C-G formula based on Cr of 2.74). Liver Function Tests:  Recent Labs Lab 11/12/15 2330 11/13/15 0440  AST 21  25  ALT 15 18  ALKPHOS 55 59  BILITOT 1.1 1.4*  PROT 5.2* 5.3*  ALBUMIN 2.2* 2.3*   Coagulation Profile:  Recent Labs Lab 11/13/15 0440  INR 1.59*   Cardiac Enzymes:  Recent Labs Lab 11/12/15 2330 11/13/15 0440 11/13/15 1120  TROPONINI 0.11* 0.08* 0.07*   Thyroid Function Tests:  Recent Labs  11/13/15 0440  TSH 3.124   Urine analysis:    Component Value Date/Time   COLORURINE YELLOW 11/13/2015 1132   APPEARANCEUR CLOUDY* 11/13/2015 1132   LABSPEC 1.023 11/13/2015 1132   PHURINE 5.5 11/13/2015 1132   GLUCOSEU NEGATIVE 11/13/2015 1132   HGBUR NEGATIVE 11/13/2015 1132   BILIRUBINUR MODERATE* 11/13/2015 1132   [place] 11/13/2015 1132   PROTEINUR 30* 11/13/2015 1132   NITRITE NEGATIVE 11/13/2015 1132    LEUKOCYTESUR MODERATE* 11/13/2015 1132    Recent Results (from the past 240 hour(s))  Blood culture (routine x 2)     Status: None (Preliminary result)   Collection Time: 11/12/15  8:20 PM  Result Value Ref Range Status   Specimen Description BLOOD RIGHT ANTECUBITAL  Final   Special Requests IN PEDIATRIC BOTTLE 3CC  Final   Culture NO GROWTH 2 DAYS  Final   Report Status PENDING  Incomplete  Blood culture (routine x 2)     Status: None (Preliminary result)   Collection Time: 11/12/15  8:25 PM  Result Value Ref Range Status   Specimen Description BLOOD RIGHT HAND  Final   Special Requests IN PEDIATRIC BOTTLE 3CC  Final   Culture NO GROWTH 2 DAYS  Final   Report Status PENDING  Incomplete  Urine culture     Status: Abnormal   Collection Time: 11/13/15 11:32 AM  Result Value Ref Range Status   Specimen Description URINE, CLEAN CATCH  Final   Special Requests NONE  Final   Culture 60,000 COLONIES/mL YEAST (A)  Final   Report Status 11/14/2015 FINAL  Final  MRSA PCR Screening     Status: None   Collection Time: 11/13/15  5:21 PM  Result Value Ref Range Status   MRSA by PCR NEGATIVE NEGATIVE Final    Comment:        The GeneXpert MRSA Assay (FDA approved for NASAL specimens only), is one component of a comprehensive MRSA colonization surveillance program. It is not intended to diagnose MRSA infection nor to guide or monitor treatment for MRSA infections.       Radiology Studies: Dg Chest 2 View  11/14/2015  CLINICAL DATA:  Acute respiratory failure with hypoxemia. EXAM: CHEST  2 VIEW COMPARISON:  November 12, 2015. FINDINGS: Stable cardiomediastinal silhouette. No pneumothorax is noted. Left lung is clear. Increased right midlung opacity is noted concerning for subsegmental atelectasis mild right pleural effusion remains. Stable presence of calcified loose body in right shoulder area. IMPRESSION: Increased right midlung opacity is noted most consistent with subsegmental  atelectasis. Mild right pleural effusion is noted as well. Electronically Signed   By: Marijo Conception, M.D.   On: 11/14/2015 07:40   Dg Chest 2 View  11/12/2015  CLINICAL DATA:  Shortness of breath for 2 weeks. Recent diagnosis of pneumonia. EXAM: CHEST  2 VIEW COMPARISON:  None. FINDINGS: Heart size and pulmonary vascularity are normal. There is calcification in the arch of the aorta. There is a moderate right pleural effusion. Left lung is clear. No acute bone abnormality. Degenerative changes of both shoulders with a large calcified loose body in the right shoulder joint in  the subcoracoid recess. IMPRESSION: Moderate right pleural effusion.  Aortic atherosclerosis. Electronically Signed   By: Lorriane Shire M.D.   On: 11/12/2015 16:09   Dg Abd 1 View  11/13/2015  CLINICAL DATA:  Abdominal distention EXAM: ABDOMEN - 1 VIEW COMPARISON:  None. FINDINGS: Mild gastric distention. Nonobstructive bowel gas pattern. Cholecystectomy clips. Degenerative changes of the visualized thoracolumbar spine. IMPRESSION: Unremarkable abdominal radiograph. Electronically Signed   By: Julian Hy M.D.   On: 11/13/2015 00:04   Ct Chest Wo Contrast  11/12/2015  CLINICAL DATA:  Shortness of breath, 2 weeks duration. Treated for pneumonia. Pleural effusion. EXAM: CT CHEST WITHOUT CONTRAST TECHNIQUE: Multidetector CT imaging of the chest was performed following the standard protocol without IV contrast. COMPARISON:  Radiography same day FINDINGS: There is a moderate to large pleural effusion on the right layering dependently. There is dependent atelectasis in the right lung. The aerated portion of the right lung is clear except for minimal atelectasis or scarring in the middle lobe. No pleural effusion on the left. There is minimal atelectasis at the left base. The left lung is otherwise clear. No underlying emphysema. No mediastinal or hilar mass or lymphadenopathy. There is atherosclerosis of the aorta and extensive  coronary artery calcification. Scans in the upper abdomen show a large amount of ascites. There are at least 2 large liver cysts, not completely evaluated. The larger cyst at the junction of the right and left lobes measures up to 11 cm in diameter. Right lobe cyst partially imaged measures up to 7 cm in diameter. IMPRESSION: Right pleural effusion layering dependently with dependent pulmonary atelectasis. The aerated lung does not show evidence of pre-existing lung disease or active pneumonia. Atherosclerosis of the aorta and the coronary arteries. Large amount of ascites. Two large liver cysts. The abdomen was not completely evaluated. Electronically Signed   By: Nelson Chimes M.D.   On: 11/12/2015 18:30   US Abdomen Limited Ruq  11/13/2015  CLINICAL DATA:  Abdominal distention for 1 month EXAM: LIMITED ABDOMEN ULTRASOUND FOR ASCITES TECHNIQUE: Limited ultrasound survey for ascites was performed in all four abdominal quadrants. COMPARISON:  None. FINDINGS: There is moderate generalized ascites. There are loops of surrounding peristalsing bowel. IMPRESSION: Moderate generalized ascites. Electronically Signed   By: Lowella Grip III M.D.   On: 11/13/2015 09:22     Medications:  Scheduled: . fluconazole (DIFLUCAN) IV  100 mg Intravenous Q24H  . magic mouthwash  5 mL Oral TID  . metoprolol tartrate  25 mg Oral BID  . mometasone-formoterol  2 puff Inhalation BID  . ondansetron (ZOFRAN) IV  4 mg Intravenous Once  . pantoprazole  40 mg Oral Daily  . piperacillin-tazobactam (ZOSYN)  IV  3.375 g Intravenous Q8H  . sertraline  25 mg Oral Daily  . sodium chloride flush  3 mL Intravenous Q12H  . sodium chloride flush  3 mL Intravenous Q12H  . vancomycin  1,500 mg Intravenous Q48H   Continuous: . heparin 1,050 Units/hr (11/14/15 1008)   [id number] chloride, acetaminophen **OR** acetaminophen, ALPRAZolam, HYDROcodone-acetaminophen, ondansetron **OR** ondansetron (ZOFRAN) IV, sodium chloride flush,  traMADol  Assessment/Plan:  Principal Problem:   Acute on chronic diastolic CHF (congestive heart failure), NYHA class 4 (HCC) Active Problems:   Pleural effusion   Essential hypertension   Hyponatremia   Sepsis ([place])   Anasarca   New onset a-fib (HCC)   Elevated troponin   Acute respiratory failure with hypoxemia (HCC)   Ascites    Right-sided pleural effusion Pulmonology  is following. Thought to be secondary to heart failure rather than de novo pulmonary pathology. CT scan of the chest did not show any infiltrates. Plan is to continue diuresis for now.  Anasarca with ascites, pleural effusion, lower extremity edema According to patient reports she could've gained approximately 40-50 pounds or even more over the last many months. Reason for her presentation is not entirely clear. She is thought to have diastolic heart failure. Echocardiogram has been ordered and is pending. Cardiology is following. She is on Lasix. Has had reasonable urine output. Creatinine, however, is rising. No abdominal tenderness on examination. Initially paracentesis was considered. However, due to renal failure, this was deferred.  Possible acute on chronic kidney disease Baseline renal function is not known. Creatinine has climbed. Some proteinuria noted on UA. We'll consult nephrology to assist with management. Ultrasound of the abdomen ordered. She might need aggressive diuresis.  New onset atrial fibrillation Cardiology is following. Echocardiogram is pending. CHA2D vas2c score is 3. Patient started on intravenous heparin.  Mildly elevated troponin Likely demand ischemia. Further management per cardiology.  Essential hypertension Monitor blood pressures closely. Blood pressure was noted to be low this morning. Stabilized subsequently. Avoid hypotension. Continue beta blocker for now due to her A. fib.  DVT Prophylaxis: On IV heparin    Code Status: Full Code  Family Communication: Discussed with  patient and her husband  Disposition Plan: Management as outlined above. Mobilize as tolerated.    LOS: 2 days   [facility] Pager [phone] 11/14/2015, 1:51 PM  If 7PM-7AM, please contact night-coverage at [email or web address], password [facility]

## 2015-11-14 NOTE — Progress Notes (Signed)
Report received in patient's room via Janett Billow RN using SBAR format, reviewed orders and general condition, assumed care of patient

## 2015-11-14 NOTE — Progress Notes (Signed)
Initial Nutrition Assessment  DOCUMENTATION CODES:   Morbid obesity  INTERVENTION:    Ensure Enlive PO BID, each supplement provides 350 kcal and 20 grams of protein  NUTRITION DIAGNOSIS:   Inadequate oral intake related to poor appetite as evidenced by per patient/family report.  GOAL:   Patient will meet greater than or equal to 90% of their needs  MONITOR:   PO intake, Supplement acceptance, Labs, Weight trends  REASON FOR ASSESSMENT:   Consult Assessment of nutrition requirement/status  ASSESSMENT:   82 year old female admitted on 4/17 with worsening shortness of breath.  Received consult for recent poor PO intake. Per H&P patient has gained weight despite very poor appetite. Likely related to fluid status with hx of CHF. Unable to speak with patient or complete nutrition focused physical exam at this time, patient not in her room, she is in a procedure. Will add PO supplements to maximize oral intake.   Diet Order:  Diet Heart Room service appropriate?: Yes; Fluid consistency:: Thin  Skin:  Wound (see comment) (wound to thigh)  Last BM:  unknown  Height:   Ht Readings from Last 1 Encounters:  11/13/15 5\' 3"  (1.6 m)    Weight:   Wt Readings from Last 1 Encounters:  11/14/15 266 lb 1.6 oz (120.702 kg)    Ideal Body Weight:  52.3 kg  BMI:  Body mass index is 47.15 kg/(m^2).  Estimated Nutritional Needs:   Kcal:  1800-2000  Protein:  110-130 gm  Fluid:  1.8-2 L  EDUCATION NEEDS:   No education needs identified at this time  Molli Barrows, [place], [place], [place] Pager [phone] After Hours Pager [phone]

## 2015-11-14 NOTE — Progress Notes (Signed)
  Echocardiogram 2D Echocardiogram has been performed.  Diamond Nickel 11/14/2015, 12:56 PM

## 2015-11-14 NOTE — Consult Note (Signed)
[facility] Renal Consultation Note  Requesting MD:  [facility] Indication for Consultation: Acute on Chronic Kidney Disease  HPI:  Kendra Martinez is a 82 y.o. female with a PMH significant for dCHF and recent admission for PNA (Klebsiella) at an outside hospital presenting with SOB and worsening bilateral edema over the past few weeks. Patient presented significantly volume overloaded and renal function has not tolerated diuretics well.  CREATININE, SER  Date/Time Value Ref Range Status  11/14/2015 06:22 AM 2.74* 0.44 - 1.00 mg/dL Final  11/13/2015 04:40 AM 2.47* 0.44 - 1.00 mg/dL Final  11/12/2015 11:30 PM 2.37* 0.44 - 1.00 mg/dL Final  11/12/2015 04:19 PM 2.56* 0.44 - 1.00 mg/dL Final     PMHx:   Past Medical History  Diagnosis Date  . CHF (congestive heart failure) ([place])   . Hypertension   . Pleural effusion 10/2015  . Shortness of breath dyspnea   . Bipolar disorder ([place])   . GERD (gastroesophageal reflux disease)   . Ascites   . 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 Cholecystectomy      Family Hx:  Family History  Problem Relation Age of Onset  . Kidney cancer Mother   . COPD Father   . Diabetes type II Other   . Stroke Neg Hx     Social History:  reports that she has never smoked. She has never used smokeless tobacco. She reports that she does not drink alcohol or use illicit drugs.  Allergies:  Allergies  Allergen Reactions  . Sulfa Antibiotics Hives    Medications: Prior to Admission medications   Medication Sig Start Date End Date Taking? Authorizing Provider  albuterol (PROVENTIL HFA;VENTOLIN HFA) 108 (90 Base) MCG/ACT inhaler Inhale 1-2 puffs into the lungs every 4 (four) hours as needed for wheezing or shortness of breath.   Yes Historical Provider, MD  ALPRAZolam (XANAX) 0.25 MG tablet Take 0.25 mg by mouth 3 (three) times daily as needed for anxiety.   Yes Historical Provider, MD  fluticasone  furoate-vilanterol (BREO ELLIPTA) 100-25 MCG/INH AEPB Inhale 1 puff into the lungs daily.   Yes Historical Provider, MD  metoprolol tartrate (LOPRESSOR) 25 MG tablet Take 25 mg by mouth 2 (two) times daily.   Yes Historical Provider, MD  mometasone-formoterol (DULERA) 200-5 MCG/ACT AERO Inhale 2 puffs into the lungs 2 (two) times daily.   Yes Historical Provider, MD  omeprazole (PRILOSEC) 20 MG capsule Take 20 mg by mouth daily.   Yes Historical Provider, MD  ondansetron (ZOFRAN) 4 MG tablet Take 4 mg by mouth daily as needed for nausea.   Yes Historical Provider, MD  promethazine (PHENERGAN) 25 MG tablet Take 25 mg by mouth every 8 (eight) hours as needed for nausea or vomiting.   Yes Historical Provider, MD  sertraline (ZOLOFT) 25 MG tablet Take 25 mg by mouth daily.   Yes Historical Provider, MD  tiotropium (SPIRIVA) 18 MCG inhalation capsule Place 18 mcg into inhaler and inhale daily.   Yes Historical Provider, MD  Tiotropium Bromide Monohydrate (SPIRIVA RESPIMAT) 2.5 MCG/ACT AERS Inhale 2 puffs into the lungs daily.    Yes Historical Provider, MD  traMADol (ULTRAM) 50 MG tablet Take 50 mg by mouth every 12 (twelve) hours as needed for moderate pain.   Yes Historical Provider, MD    I have reviewed the patient's current medications.  Labs:  Results for orders placed or performed during the hospital encounter  of 11/12/15 (from the past 48 hour(s))  Blood culture (routine x 2)     Status: None (Preliminary result)   Collection Time: 11/12/15  8:20 PM  Result Value Ref Range   Specimen Description BLOOD RIGHT ANTECUBITAL    Special Requests IN PEDIATRIC BOTTLE 3CC    Culture NO GROWTH 2 DAYS    Report Status PENDING   Blood culture (routine x 2)     Status: None (Preliminary result)   Collection Time: 11/12/15  8:25 PM  Result Value Ref Range   Specimen Description BLOOD RIGHT HAND    Special Requests IN PEDIATRIC BOTTLE 3CC    Culture NO GROWTH 2 DAYS    Report Status PENDING    Troponin I (q 6hr x 3)     Status: Abnormal   Collection Time: 11/12/15 11:30 PM  Result Value Ref Range   Troponin I 0.11 (H) <0.031 ng/mL    Comment:        PERSISTENTLY INCREASED TROPONIN VALUES IN THE RANGE OF 0.04-0.49 ng/mL CAN BE SEEN IN:       -UNSTABLE ANGINA       -CONGESTIVE HEART FAILURE       -MYOCARDITIS       -CHEST TRAUMA       -ARRYHTHMIAS       -LATE PRESENTING MYOCARDIAL INFARCTION       -COPD   CLINICAL FOLLOW-UP RECOMMENDED.   Comprehensive metabolic panel     Status: Abnormal   Collection Time: 11/12/15 11:30 PM  Result Value Ref Range   Sodium 133 (L) 135 - 145 mmol/L   Potassium 4.4 3.5 - 5.1 mmol/L   Chloride 97 (L) 101 - 111 mmol/L   CO2 22 22 - 32 mmol/L   Glucose, Bld 97 65 - 99 mg/dL   BUN 52 (H) 6 - 20 mg/dL   Creatinine, Ser 2.37 (H) 0.44 - 1.00 mg/dL   Calcium 7.9 (L) 8.9 - 10.3 mg/dL   Total Protein 5.2 (L) 6.5 - 8.1 g/dL   Albumin 2.2 (L) 3.5 - 5.0 g/dL   AST 21 15 - 41 U/L   ALT 15 14 - 54 U/L   Alkaline Phosphatase 55 38 - 126 U/L   Total Bilirubin 1.1 0.3 - 1.2 mg/dL   GFR calc non Af Amer 18 (L) >60 mL/min   GFR calc Af Amer 21 (L) >60 mL/min    Comment: (NOTE) The eGFR has been calculated using the CKD EPI equation. This calculation has not been validated in all clinical situations. eGFR's persistently <60 mL/min signify possible Chronic Kidney Disease.    Anion gap 14 5 - 15  Lactic acid, plasma     Status: None   Collection Time: 11/12/15 11:40 PM  Result Value Ref Range   Lactic Acid, Venous 1.2 0.5 - 2.0 mmol/L  Troponin I (q 6hr x 3)     Status: Abnormal   Collection Time: 11/13/15  4:40 AM  Result Value Ref Range   Troponin I 0.08 (H) <0.031 ng/mL    Comment:        PERSISTENTLY INCREASED TROPONIN VALUES IN THE RANGE OF 0.04-0.49 ng/mL CAN BE SEEN IN:       -UNSTABLE ANGINA       -CONGESTIVE HEART FAILURE       -MYOCARDITIS       -CHEST TRAUMA       -ARRYHTHMIAS       -LATE PRESENTING MYOCARDIAL INFARCTION        -  COPD   CLINICAL FOLLOW-UP RECOMMENDED.   Prealbumin     Status: Abnormal   Collection Time: 11/13/15  4:40 AM  Result Value Ref Range   Prealbumin 5.9 (L) 18 - 38 mg/dL  Procalcitonin - Baseline     Status: None   Collection Time: 11/13/15  4:40 AM  Result Value Ref Range   Procalcitonin 0.65 ng/mL    Comment:        Interpretation: PCT > 0.5 ng/mL and <= 2 ng/mL: Systemic infection (sepsis) is possible, but other conditions are known to elevate PCT as well. (NOTE)         ICU PCT Algorithm               Non ICU PCT Algorithm    ----------------------------     ------------------------------         PCT < 0.25 ng/mL                 PCT < 0.1 ng/mL     Stopping of antibiotics            Stopping of antibiotics       strongly encouraged.               strongly encouraged.    ----------------------------     ------------------------------       PCT level decrease by               PCT < 0.25 ng/mL       >= 80% from peak PCT       OR PCT 0.25 - 0.5 ng/mL          Stopping of antibiotics                                             encouraged.     Stopping of antibiotics           encouraged.    ----------------------------     ------------------------------       PCT level decrease by              PCT >= 0.25 ng/mL       < 80% from peak PCT        AND PCT >= 0.5 ng/mL             Continuing antibiotics                                              encouraged.       Continuing antibiotics            encouraged.    ----------------------------     ------------------------------     PCT level increase compared          PCT > 0.5 ng/mL         with peak PCT AND          PCT >= 0.5 ng/mL             Escalation of antibiotics  strongly encouraged.      Escalation of antibiotics        strongly encouraged.   Lactic acid, plasma     Status: None   Collection Time: 11/13/15  4:40 AM  Result Value Ref Range   Lactic Acid, Venous 1.5 0.5 - 2.0  mmol/L  Protime-INR     Status: Abnormal   Collection Time: 11/13/15  4:40 AM  Result Value Ref Range   Prothrombin Time 19.0 (H) 11.6 - 15.2 seconds   INR 1.59 (H) 0.00 - 1.49  APTT     Status: Abnormal   Collection Time: 11/13/15  4:40 AM  Result Value Ref Range   aPTT 122 (H) 24 - 37 seconds    Comment:        IF BASELINE aPTT IS ELEVATED, SUGGEST PATIENT RISK ASSESSMENT BE USED TO DETERMINE APPROPRIATE ANTICOAGULANT THERAPY.   Magnesium     Status: Abnormal   Collection Time: 11/13/15  4:40 AM  Result Value Ref Range   Magnesium 1.6 (L) 1.7 - 2.4 mg/dL  Phosphorus     Status: None   Collection Time: 11/13/15  4:40 AM  Result Value Ref Range   Phosphorus 3.9 2.5 - 4.6 mg/dL  TSH     Status: None   Collection Time: 11/13/15  4:40 AM  Result Value Ref Range   TSH 3.124 0.350 - 4.500 uIU/mL  Comprehensive metabolic panel     Status: Abnormal   Collection Time: 11/13/15  4:40 AM  Result Value Ref Range   Sodium 132 (L) 135 - 145 mmol/L   Potassium 4.1 3.5 - 5.1 mmol/L   Chloride 96 (L) 101 - 111 mmol/L   CO2 19 (L) 22 - 32 mmol/L   Glucose, Bld 116 (H) 65 - 99 mg/dL   BUN 52 (H) 6 - 20 mg/dL   Creatinine, Ser 2.47 (H) 0.44 - 1.00 mg/dL   Calcium 8.0 (L) 8.9 - 10.3 mg/dL   Total Protein 5.3 (L) 6.5 - 8.1 g/dL   Albumin 2.3 (L) 3.5 - 5.0 g/dL   AST 25 15 - 41 U/L   ALT 18 14 - 54 U/L   Alkaline Phosphatase 59 38 - 126 U/L   Total Bilirubin 1.4 (H) 0.3 - 1.2 mg/dL   GFR calc non Af Amer 17 (L) >60 mL/min   GFR calc Af Amer 20 (L) >60 mL/min    Comment: (NOTE) The eGFR has been calculated using the CKD EPI equation. This calculation has not been validated in all clinical situations. eGFR's persistently <60 mL/min signify possible Chronic Kidney Disease.    Anion gap 17 (H) 5 - 15  CBC     Status: Abnormal   Collection Time: 11/13/15  4:40 AM  Result Value Ref Range   WBC 18.9 (H) 4.0 - 10.5 K/uL   RBC 4.09 3.87 - 5.11 MIL/uL   Hemoglobin 12.5 12.0 - 15.0 g/dL    HCT 37.9 36.0 - 46.0 %   MCV 92.7 78.0 - 100.0 fL   MCH 30.6 26.0 - 34.0 pg   MCHC 33.0 30.0 - 36.0 g/dL   RDW 14.5 11.5 - 15.5 %   Platelets 217 150 - 400 K/uL  Lactic acid, plasma     Status: None   Collection Time: 11/13/15  7:34 AM  Result Value Ref Range   Lactic Acid, Venous 1.2 0.5 - 2.0 mmol/L  Troponin I (q 6hr x 3)     Status: Abnormal   Collection Time: 11/13/15  11:20 AM  Result Value Ref Range   Troponin I 0.07 (H) <0.031 ng/mL    Comment:        PERSISTENTLY INCREASED TROPONIN VALUES IN THE RANGE OF 0.04-0.49 ng/mL CAN BE SEEN IN:       -UNSTABLE ANGINA       -CONGESTIVE HEART FAILURE       -MYOCARDITIS       -CHEST TRAUMA       -ARRYHTHMIAS       -LATE PRESENTING MYOCARDIAL INFARCTION       -COPD   CLINICAL FOLLOW-UP RECOMMENDED.   Heparin level (unfractionated)     Status: None   Collection Time: 11/13/15 11:20 AM  Result Value Ref Range   Heparin Unfractionated 0.48 0.30 - 0.70 IU/mL    Comment:        IF HEPARIN RESULTS ARE BELOW EXPECTED VALUES, AND PATIENT DOSAGE HAS BEEN CONFIRMED, SUGGEST FOLLOW UP TESTING OF ANTITHROMBIN III LEVELS.   Urinalysis, Routine w reflex microscopic (not at [facility])     Status: Abnormal   Collection Time: 11/13/15 11:32 AM  Result Value Ref Range   Color, Urine YELLOW YELLOW   APPearance CLOUDY (A) CLEAR   Specific Gravity, Urine 1.023 1.005 - 1.030   pH 5.5 5.0 - 8.0   Glucose, UA NEGATIVE NEGATIVE mg/dL   Hgb urine dipstick NEGATIVE NEGATIVE   Bilirubin Urine MODERATE (A) NEGATIVE   Ketones, ur NEGATIVE NEGATIVE mg/dL   Protein, ur 30 (A) NEGATIVE mg/dL   Nitrite NEGATIVE NEGATIVE   Leukocytes, UA MODERATE (A) NEGATIVE  Urine culture     Status: Abnormal   Collection Time: 11/13/15 11:32 AM  Result Value Ref Range   Specimen Description URINE, CLEAN CATCH    Special Requests NONE    Culture 60,000 COLONIES/mL YEAST (A)    Report Status 11/14/2015 FINAL   Urine microscopic-add on     Status: Abnormal    Collection Time: 11/13/15 11:32 AM  Result Value Ref Range   Squamous Epithelial / LPF 6-30 (A) NONE SEEN   WBC, UA 6-30 0 - 5 WBC/hpf   RBC / HPF 0-5 0 - 5 RBC/hpf   Bacteria, UA MANY (A) NONE SEEN   Casts HYALINE CASTS (A) NEGATIVE    Comment: GRANULAR CAST  Creatinine, urine, random     Status: None   Collection Time: 11/13/15 11:33 AM  Result Value Ref Range   Creatinine, Urine 212.96 mg/dL  Sodium, urine, random     Status: None   Collection Time: 11/13/15 11:33 AM  Result Value Ref Range   Sodium, Ur <10 mmol/L    Comment: REPEATED TO VERIFY  Osmolality, urine     Status: None   Collection Time: 11/13/15  1:16 PM  Result Value Ref Range   Osmolality, Ur 501 300 - 900 mOsm/kg  MRSA PCR Screening     Status: None   Collection Time: 11/13/15  5:21 PM  Result Value Ref Range   MRSA by PCR NEGATIVE NEGATIVE    Comment:        The GeneXpert MRSA Assay (FDA approved for NASAL specimens only), is one component of a comprehensive MRSA colonization surveillance program. It is not intended to diagnose MRSA infection nor to guide or monitor treatment for MRSA infections.   Heparin level (unfractionated)     Status: None   Collection Time: 11/13/15  6:21 PM  Result Value Ref Range   Heparin Unfractionated 0.58 0.30 - 0.70 IU/mL  Comment:        IF HEPARIN RESULTS ARE BELOW EXPECTED VALUES, AND PATIENT DOSAGE HAS BEEN CONFIRMED, SUGGEST FOLLOW UP TESTING OF ANTITHROMBIN III LEVELS.   Heparin level (unfractionated)     Status: None   Collection Time: 11/14/15  6:22 AM  Result Value Ref Range   Heparin Unfractionated 0.66 0.30 - 0.70 IU/mL    Comment:        IF HEPARIN RESULTS ARE BELOW EXPECTED VALUES, AND PATIENT DOSAGE HAS BEEN CONFIRMED, SUGGEST FOLLOW UP TESTING OF ANTITHROMBIN III LEVELS.   CBC     Status: Abnormal   Collection Time: 11/14/15  6:22 AM  Result Value Ref Range   WBC 18.5 (H) 4.0 - 10.5 K/uL   RBC 3.99 3.87 - 5.11 MIL/uL   Hemoglobin 12.1  12.0 - 15.0 g/dL   HCT 37.1 36.0 - 46.0 %   MCV 93.0 78.0 - 100.0 fL   MCH 30.3 26.0 - 34.0 pg   MCHC 32.6 30.0 - 36.0 g/dL   RDW 14.6 11.5 - 15.5 %   Platelets 211 150 - 400 K/uL  Basic metabolic panel     Status: Abnormal   Collection Time: 11/14/15  6:22 AM  Result Value Ref Range   Sodium 133 (L) 135 - 145 mmol/L   Potassium 4.4 3.5 - 5.1 mmol/L   Chloride 94 (L) 101 - 111 mmol/L   CO2 21 (L) 22 - 32 mmol/L   Glucose, Bld 109 (H) 65 - 99 mg/dL   BUN 63 (H) 6 - 20 mg/dL   Creatinine, Ser 2.74 (H) 0.44 - 1.00 mg/dL   Calcium 8.1 (L) 8.9 - 10.3 mg/dL   GFR calc non Af Amer 15 (L) >60 mL/min   GFR calc Af Amer 18 (L) >60 mL/min    Comment: (NOTE) The eGFR has been calculated using the CKD EPI equation. This calculation has not been validated in all clinical situations. eGFR's persistently <60 mL/min signify possible Chronic Kidney Disease.    Anion gap 18 (H) 5 - 15     ROS:  A comprehensive review of systems was negative except for: Constitutional: positive for fatigue Respiratory: positive for cough and dyspnea on exertion Gastrointestinal: positive for abdominal pain  Physical Exam: Filed Vitals:   11/14/15 1300 11/14/15 1639  BP: 110/69 112/66  Pulse: 80 79  Temp: 98 F (36.7 C) 97.8 F (36.6 C)  Resp: 18 19     General -- oriented x3, pleasant and cooperative. HEENT -- Head is normocephalic. PERRLA. EOMI. MMM Chest -- good expansion. Lungs clear to auscultation. Cardiac -- RRR. No murmurs noted.  Abdomen -- soft, obese, RUQ tenderness, slightly distended. No masses palpable. Bowel sounds present. CNS -- no focal deficits Extremeties - no tenderness or effusions noted. +2 pitting edema to the tibial tuberosity bilaterally. ROM good. Dorsalis pedis pulses present and symmetrical.    Assessment/Plan: 1. AoCKD: suspect cardiorenal syndrome vs. OP ARB use. FENa <0.1%, BUN/Cr >20:1, urine osmolality 501 making prerenal injury more likely than ATN. However,  granular cast noted on UA. Cr up to 2.74 today after IV lasix. UOP 1.7L in 24hr. BP down to 80s/60s. Would hold Lasix for now. Fluid restrict. Avoid nephrotixic meds. 2. Hyponatremia: Improving. Will monitor. 3. Dyspnea: CXR w/ midlung opacity. On vanc/zosyn. Consider DC vanc due to nephrotixicity; would consider 3rd gen ceph.  4. AoCHF: Echo results pending. Suspect major player in renal dysfunction. Manage per cards 5. Ascites: abdominal pain present (RUQ). US abdomen w/  moderate fluid and benign appearing liver cysts x2. Would hold on MRI contrast unless absolutely necessary. May need paracentesis if resp distress or abdominal pain worsens. Consider Lipase but pancreatitis is unlikely.  Manage per primary.    Georges Lynch 11/14/2015, 5:21 PM

## 2015-11-14 NOTE — Progress Notes (Signed)
Patient Name: Kendra Martinez Date of Encounter: 11/14/2015     Principal Problem:   Acute on chronic diastolic CHF (congestive heart failure), NYHA class 4 (HCC) Active Problems:   Pleural effusion   Essential hypertension   Hyponatremia   Sepsis ([place])   Anasarca   New onset a-fib (HCC)   Elevated troponin   Acute respiratory failure with hypoxemia (HCC)   Ascites    SUBJECTIVE  Slept well last night. Denies significant discomfort.   CURRENT MEDS . fluconazole (DIFLUCAN) IV  100 mg Intravenous Q24H  . magic mouthwash  5 mL Oral TID  . metoprolol tartrate  25 mg Oral BID  . mometasone-formoterol  2 puff Inhalation BID  . ondansetron (ZOFRAN) IV  4 mg Intravenous Once  . pantoprazole  40 mg Oral Daily  . piperacillin-tazobactam (ZOSYN)  IV  3.375 g Intravenous Q8H  . sertraline  25 mg Oral Daily  . sodium chloride flush  3 mL Intravenous Q12H  . sodium chloride flush  3 mL Intravenous Q12H  . vancomycin  1,500 mg Intravenous Q48H    OBJECTIVE  Filed Vitals:   11/14/15 0451 11/14/15 0837 11/14/15 0900 11/14/15 0901  BP: 100/59 85/59 101/58   Pulse: 81 88 77   Temp: 97.6 F (36.4 C) 97.9 F (36.6 C)    TempSrc: Oral Oral    Resp: 18 18 26    Height:      Weight: 266 lb 1.6 oz (120.702 kg)     SpO2: 96% 98% 98% 100%    Intake/Output Summary (Last 24 hours) at 11/14/15 0937 Last data filed at 11/14/15 0600  Gross per 24 hour  Intake    628 ml  Output   1700 ml  Net  -1072 ml   Filed Weights   11/12/15 2005 11/13/15 1644 11/14/15 0451  Weight: 258 lb (117.028 kg) 264 lb 12.4 oz (120.1 kg) 266 lb 1.6 oz (120.702 kg)    PHYSICAL EXAM  General: Pleasant, NAD. Neuro: Alert and oriented X 3. Moves all extremities spontaneously. Psych: Normal affect. HEENT:  Normal  Neck: Supple without bruits or JVD. Lungs:  Resp regular and unlabored. Decreased breath sound in R base Heart: Irregular no s3, s4, or murmurs. Abdomen: Soft, non-tender, non-distended, BS + x  4.  Extremities: No clubbing, cyanosis. DP/PT/Radials 2+ and equal bilaterally. 3+ pitting edema  Accessory Clinical Findings  CBC  Recent Labs  11/12/15 1619 11/13/15 0440 11/14/15 0622  WBC 22.3* 18.9* 18.5*  NEUTROABS 19.9*  --   --   HGB 13.0 12.5 12.1  HCT 39.2 37.9 37.1  MCV 92.7 92.7 93.0  PLT 219 217 [id number]   Basic Metabolic Panel  Recent Labs  11/13/15 0440 11/14/15 0622  NA 132* 133*  K 4.1 4.4  CL 96* 94*  CO2 19* 21*  GLUCOSE 116* 109*  BUN 52* 63*  CREATININE 2.47* 2.74*  CALCIUM 8.0* 8.1*  MG 1.6*  --   PHOS 3.9  --    Liver Function Tests  Recent Labs  11/12/15 2330 11/13/15 0440  AST 21 25  ALT 15 18  ALKPHOS 55 59  BILITOT 1.1 1.4*  PROT 5.2* 5.3*  ALBUMIN 2.2* 2.3*   Cardiac Enzymes  Recent Labs  11/12/15 2330 11/13/15 0440 11/13/15 1120  TROPONINI 0.11* 0.08* 0.07*   BNP Invalid input(s): POCBNP D-Dimer  Recent Labs  11/12/15 1619  DDIMER 18.12*   Thyroid Function Tests  Recent Labs  11/13/15 0440  TSH 3.124  TELE afib with HR 80s    ECG  No new EKG  Echocardiogram  Pending     Radiology/Studies  Dg Chest 2 View  11/14/2015  CLINICAL DATA:  Acute respiratory failure with hypoxemia. EXAM: CHEST  2 VIEW COMPARISON:  November 12, 2015. FINDINGS: Stable cardiomediastinal silhouette. No pneumothorax is noted. Left lung is clear. Increased right midlung opacity is noted concerning for subsegmental atelectasis mild right pleural effusion remains. Stable presence of calcified loose body in right shoulder area. IMPRESSION: Increased right midlung opacity is noted most consistent with subsegmental atelectasis. Mild right pleural effusion is noted as well. Electronically Signed   By: Marijo Conception, M.D.   On: 11/14/2015 07:40   Dg Chest 2 View  11/12/2015  CLINICAL DATA:  Shortness of breath for 2 weeks. Recent diagnosis of pneumonia. EXAM: CHEST  2 VIEW COMPARISON:  None. FINDINGS: Heart size and pulmonary  vascularity are normal. There is calcification in the arch of the aorta. There is a moderate right pleural effusion. Left lung is clear. No acute bone abnormality. Degenerative changes of both shoulders with a large calcified loose body in the right shoulder joint in the subcoracoid recess. IMPRESSION: Moderate right pleural effusion.  Aortic atherosclerosis. Electronically Signed   By: Lorriane Shire M.D.   On: 11/12/2015 16:09   Dg Abd 1 View  11/13/2015  CLINICAL DATA:  Abdominal distention EXAM: ABDOMEN - 1 VIEW COMPARISON:  None. FINDINGS: Mild gastric distention. Nonobstructive bowel gas pattern. Cholecystectomy clips. Degenerative changes of the visualized thoracolumbar spine. IMPRESSION: Unremarkable abdominal radiograph. Electronically Signed   By: Julian Hy M.D.   On: 11/13/2015 00:04   Ct Chest Wo Contrast  11/12/2015  CLINICAL DATA:  Shortness of breath, 2 weeks duration. Treated for pneumonia. Pleural effusion. EXAM: CT CHEST WITHOUT CONTRAST TECHNIQUE: Multidetector CT imaging of the chest was performed following the standard protocol without IV contrast. COMPARISON:  Radiography same day FINDINGS: There is a moderate to large pleural effusion on the right layering dependently. There is dependent atelectasis in the right lung. The aerated portion of the right lung is clear except for minimal atelectasis or scarring in the middle lobe. No pleural effusion on the left. There is minimal atelectasis at the left base. The left lung is otherwise clear. No underlying emphysema. No mediastinal or hilar mass or lymphadenopathy. There is atherosclerosis of the aorta and extensive coronary artery calcification. Scans in the upper abdomen show a large amount of ascites. There are at least 2 large liver cysts, not completely evaluated. The larger cyst at the junction of the right and left lobes measures up to 11 cm in diameter. Right lobe cyst partially imaged measures up to 7 cm in diameter.  IMPRESSION: Right pleural effusion layering dependently with dependent pulmonary atelectasis. The aerated lung does not show evidence of pre-existing lung disease or active pneumonia. Atherosclerosis of the aorta and the coronary arteries. Large amount of ascites. Two large liver cysts. The abdomen was not completely evaluated. Electronically Signed   By: Nelson Chimes M.D.   On: 11/12/2015 18:30   US Abdomen Limited Ruq  11/13/2015  CLINICAL DATA:  Abdominal distention for 1 month EXAM: LIMITED ABDOMEN ULTRASOUND FOR ASCITES TECHNIQUE: Limited ultrasound survey for ascites was performed in all four abdominal quadrants. COMPARISON:  None. FINDINGS: There is moderate generalized ascites. There are loops of surrounding peristalsing bowel. IMPRESSION: Moderate generalized ascites. Electronically Signed   By: Lowella Grip III M.D.   On: 11/13/2015 09:22  ASSESSMENT AND PLAN  1. Massive fluid overload likely combination of acute on chronic diastolic heart failure and hypoalbuminemia - Cr went up slightly with diuresis. Will need nephrology consult. Likely also need paracentesis as I doubt we can remove significant fluid even with aggressive diuresis  - did not put out much, on -1L on 80mg  BID lasix. Pending echo  2. Newly diagnosed atrial fibrillation - CHA2DS2-Vasc score 3 (HF, female, HTN) - IV heparin, likely will need coumadin prior to discharge, no NOAC given renal issue. Need to coordinate starting coumadin with IM and PCCM to see if she need paracentesis or thoracetesis  3. Recurrent R pleural effusion: per PCCM  4. Large ascites: may need paracentesis  5. Recent Klebsiella pneumonia UTI treated at [facility]  6. Possible sepsis with elevated WBC 22: per IM  Ruben Im Pager: [phone]  I have seen and examined the patient along with Almyra Deforest PA-C.  I have reviewed the chart, notes and new data.  I agree with PA's  note.  Key new complaints: not much change Key examination changes: anasarca, ascites, edema unchanged Key new findings / data: fairly mediocre UO and slight worsening of renal parameters after furosemide IV  PLAN: Echo today. Nephrology consultation. Consider bilateral renal artery stenosis. IV heparin for now.  Sanda Klein, MD, [place] [phone] 11/14/2015, 10:22 AM

## 2015-11-14 NOTE — Progress Notes (Signed)
Nephrology consulted, spoke with Dr. Justin Mend  Signed, Almyra Deforest PA Pager: [phone]

## 2015-11-15 ENCOUNTER — Encounter (HOSPITAL_COMMUNITY): Payer: Self-pay | Admitting: Physician Assistant

## 2015-11-15 DIAGNOSIS — R188 Other ascites: Secondary | ICD-10-CM

## 2015-11-15 DIAGNOSIS — N179 Acute kidney failure, unspecified: Secondary | ICD-10-CM | POA: Insufficient documentation

## 2015-11-15 DIAGNOSIS — K7460 Unspecified cirrhosis of liver: Secondary | ICD-10-CM | POA: Insufficient documentation

## 2015-11-15 DIAGNOSIS — K744 Secondary biliary cirrhosis: Secondary | ICD-10-CM

## 2015-11-15 LAB — BASIC METABOLIC PANEL
ANION GAP: 18 — AB (ref 5–15)
BUN: 65 mg/dL — ABNORMAL HIGH (ref 6–20)
CALCIUM: 8 mg/dL — AB (ref 8.9–10.3)
CO2: 21 mmol/L — ABNORMAL LOW (ref 22–32)
CREATININE: 2.73 mg/dL — AB (ref 0.44–1.00)
Chloride: 97 mmol/L — ABNORMAL LOW (ref 101–111)
GFR calc non Af Amer: 15 mL/min — ABNORMAL LOW (ref >60)
GFR, EST AFRICAN AMERICAN: 18 mL/min — AB (ref >60)
Glucose, Bld: 103 mg/dL — ABNORMAL HIGH (ref 65–99)
Potassium: 3.9 mmol/L (ref 3.5–5.1)
SODIUM: 136 mmol/L (ref 135–145)

## 2015-11-15 LAB — CBC
HCT: 36.1 % (ref 36.0–46.0)
HEMOGLOBIN: 11.7 g/dL — AB (ref 12.0–15.0)
MCH: 29.9 pg (ref 26.0–34.0)
MCHC: 32.4 g/dL (ref 30.0–36.0)
MCV: 92.3 fL (ref 78.0–100.0)
Platelets: 223 10*3/uL (ref 150–400)
RBC: 3.91 MIL/uL (ref 3.87–5.11)
RDW: 14.7 % (ref 11.5–15.5)
WBC: 18.8 10*3/uL — AB (ref 4.0–10.5)

## 2015-11-15 LAB — PROCALCITONIN: PROCALCITONIN: 0.71 ng/mL

## 2015-11-15 LAB — HEPARIN LEVEL (UNFRACTIONATED): HEPARIN UNFRACTIONATED: 0.55 [IU]/mL (ref 0.30–0.70)

## 2015-11-15 MED ORDER — MIDODRINE HCL 5 MG PO TABS
5.0000 mg | ORAL_TABLET | Freq: Three times a day (TID) | ORAL | Status: DC
  Administered 2015-11-15 – 2015-11-19 (×7): 5 mg via ORAL
  Filled 2015-11-15 (×8): qty 1

## 2015-11-15 MED ORDER — METOPROLOL TARTRATE 12.5 MG HALF TABLET: 12.5000 mg | ORAL_TABLET | Freq: Two times a day (BID) | ORAL | Status: DC

## 2015-11-15 MED ORDER — METOPROLOL TARTRATE 12.5 MG HALF TABLET
12.5000 mg | ORAL_TABLET | Freq: Two times a day (BID) | ORAL | Status: DC
  Administered 2015-11-16 – 2015-11-21 (×11): 12.5 mg via ORAL
  Filled 2015-11-15 (×11): qty 1

## 2015-11-15 MED ORDER — BOOST / RESOURCE BREEZE PO LIQD: 1.0000 | Freq: Two times a day (BID) | ORAL | Status: DC

## 2015-11-15 NOTE — Evaluation (Signed)
Physical Therapy Evaluation/Co-session with OT Patient Details Name: Ambernicole Usrey MRN: [id number] DOB: 10-02-1933 Today's Date: 11/15/2015   History of Present Illness  This 82 y.o. female admitted wit SOB and generalized weakness.   Dx; Rt pleural effusion of unclear etiology, recently diagnosed Lt heart failure and HTN, as well as sepsis.   PMH includes: Bipolar disorder  Clinical Impression  Pt is very weak and deconditioned with very low tolerance of even upright sitting on EOB.  Unable to even attempt to stand as pt could only sit a few minutes before she felt as if she would pass out.  At her current sate, she is not safe to return home with her husband.  She will likely need SNF placement for rehab.      Follow Up Recommendations SNF    Equipment Recommendations  None recommended by PT    Recommendations for Other Services   NA    Precautions / Restrictions Precautions Precautions: Fall Precaution Comments: pt is very weak      Mobility  Bed Mobility Overal bed mobility: +2 for physical assistance;Needs Assistance Bed Mobility: Supine to Sit;Sit to Supine     Supine to sit: +2 for physical assistance;Mod assist Sit to supine: +2 for physical assistance;Max assist   General bed mobility comments: Assist needed to help progress bil legs over EOB, asssist needed at trunk to partially roll to reach railing and then boost up to sitting.    Transfers                 General transfer comment: Pt unable to stand         Balance Overall balance assessment: Needs assistance Sitting-balance support: Feet supported;Bilateral upper extremity supported Sitting balance-Leahy Scale: Poor Sitting balance - Comments: Min assist to maintain sitting EOB.  Pt unable to sit longer than what it took for BP to run on automatic machine due to pt reporting lightheadedness like she is going to pass out.  BP low, but similar to most of her BPs today.  Postural control: Posterior  lean                                   Pertinent Vitals/Pain Pain Assessment: No/denies pain    Home Living Family/patient expects to be discharged to:: Private residence Living Arrangements: Spouse/significant other Available Help at Discharge: Family;Available 24 hours/day Type of Home: House Home Access: Ramped entrance     Home Layout: One level Home Equipment: Walker - 2 wheels;Shower seat;Wheelchair - manual;Grab bars - toilet;Grab bars - tub/shower Additional Comments: 2 L O2 [place] 24/7 at home.     Prior Function Level of Independence: Needs assistance;Independent with assistive device(s)   Gait / Transfers Assistance Needed: for the past week she has been unable to walk, before that she used a RW for household distances.    ADL's / Homemaking Assistance Needed: Pt has required assist from spouse         Hand Dominance   Dominant Hand: Right    Extremity/Trunk Assessment   Upper Extremity Assessment: Defer to OT evaluation           Lower Extremity Assessment: Generalized weakness (per husband sore right lower leg)      Cervical / Trunk Assessment: Other exceptions  Communication   Communication: Expressive difficulties (raspy, low voice per husband "laryngitis")  Cognition Arousal/Alertness: Awake/alert Behavior During Therapy: WFL for tasks assessed/performed Overall Cognitive  Status: Within Functional Limits for tasks assessed                      General Comments General comments (skin integrity, edema, etc.): Pt with c/o dizziness when sitting EOB.  BP 80's/60's          Assessment/Plan    PT Assessment Patient needs continued PT services  PT Diagnosis Difficulty walking;Abnormality of gait;Generalized weakness   PT Problem List Decreased strength;Decreased activity tolerance;Decreased balance;Decreased mobility;Decreased knowledge of use of DME;Cardiopulmonary status limiting activity;Obesity  PT Treatment  Interventions DME instruction;Gait training;Functional mobility training;Therapeutic activities;Therapeutic exercise;Balance training;Neuromuscular re-education;Patient/family education   PT Goals (Current goals can be found in the Care Plan section) Acute Rehab PT Goals Patient Stated Goal: to get stronger so she can go back home.  PT Goal Formulation: With patient/family Time For Goal Achievement: 11/29/15 Potential to Achieve Goals: Good    Frequency Min 2X/week        Co-evaluation PT/OT/SLP Co-Evaluation/Treatment: Yes Reason for Co-Treatment: For patient/therapist safety PT goals addressed during session: Mobility/safety with mobility;Balance;Strengthening/ROM OT goals addressed during session: ADL's and self-care;Strengthening/ROM       End of Session Equipment Utilized During Treatment: Oxygen Activity Tolerance: Other (comment) (limited by lighheadedness in sitting. ) Patient left: in bed;with call bell/phone within reach;with family/visitor present           Time: [id number] PT Time Calculation (min) (ACUTE ONLY): 24 min   Charges:   PT Evaluation $PT Eval Moderate Complexity: 1 Procedure          Amanie Mcculley B. [place], [place], DPT [phone]   11/15/2015, 4:25 PM

## 2015-11-15 NOTE — Progress Notes (Signed)
Subjective: No chest pain or SOB - just had PT and is resting  Objective: Vital signs in last 24 hours: Temp:  [97.3 F (36.3 C)-98.2 F (36.8 C)] 97.3 F (36.3 C) (04/20 1150) Pulse Rate:  [66-97] 68 (04/20 1150) Resp:  [17-21] 19 (04/20 1150) BP: (83-114)/(36-68) 83/60 mmHg (04/20 1150) SpO2:  [92 %-99 %] 98 % (04/20 1150) Weight:  [268 lb (121.564 kg)] 268 lb (121.564 kg) (04/20 0400) Weight change: 3 lb 3.6 oz (1.464 kg) Last BM Date: 11/14/15 Intake/Output from previous day: 04/19 0701 - 04/20 0700 In: 1384.6 [P.O.:420; I.V.:264.6; IV Piggyback:700] Out: 750 [Urine:750] Intake/Output this shift:    PE: General:Pleasant affect, NAD, doesn't speak, weak, ill in appearance.   Skin:Warm and dry, brisk capillary refill HEENT:normocephalic, sclera clear, mucus membranes moist Neck:supple, no JVD, no bruits  Heart:irreg irreg without murmur, gallup, rub or click Lungs:clear without rales, rhonchi, or wheezes [id number], soft, non tender, + BS, do not palpate liver spleen or masses Ext:2-3+ lower ext edema,  2+ radial pulses Neuro:alert and oriented X 3, MAE, follows commands, + facial symmetry Tele; a fib rate controlled  Lab Results:  Recent Labs  11/14/15 0622 11/15/15 0346  WBC 18.5* 18.8*  HGB 12.1 11.7*  HCT 37.1 36.1  PLT 211 223   BMET  Recent Labs  11/14/15 0622 11/15/15 0346  NA 133* 136  K 4.4 3.9  CL 94* 97*  CO2 21* 21*  GLUCOSE 109* 103*  BUN 63* 65*  CREATININE 2.74* 2.73*  CALCIUM 8.1* 8.0*    Recent Labs  11/13/15 0440 11/13/15 1120  TROPONINI 0.08* 0.07*    No results found for: CHOL, HDL, LDLCALC, LDLDIRECT, TRIG, CHOLHDL No results found for: HGBA1C   Lab Results  Component Value Date   TSH 3.124 11/13/2015    Hepatic Function Panel  Recent Labs  11/13/15 0440  PROT 5.3*  ALBUMIN 2.3*  AST 25  ALT 18  ALKPHOS 59  BILITOT 1.4*   No results for input(s): CHOL in the last 72 hours. No results for  input(s): PROTIME in the last 72 hours.     Studies/Results: Dg Chest 2 View  11/14/2015  CLINICAL DATA:  Acute respiratory failure with hypoxemia. EXAM: CHEST  2 VIEW COMPARISON:  November 12, 2015. FINDINGS: Stable cardiomediastinal silhouette. No pneumothorax is noted. Left lung is clear. Increased right midlung opacity is noted concerning for subsegmental atelectasis mild right pleural effusion remains. Stable presence of calcified loose body in right shoulder area. IMPRESSION: Increased right midlung opacity is noted most consistent with subsegmental atelectasis. Mild right pleural effusion is noted as well. Electronically Signed   By: Marijo Conception, M.D.   On: 11/14/2015 07:40   US Abdomen Complete  11/14/2015  CLINICAL DATA:  Ascites. Evaluate liver. Acute renal failure. History of hypertension and congestive heart failure. History of a cholecystectomy. EXAM: ABDOMEN ULTRASOUND COMPLETE COMPARISON:  11/13/2015 FINDINGS: Gallbladder: Surgically absent. Common bile duct: Diameter: 6 mm Liver: 2 large cystic areas, 1 projecting along the anterior segment right lobe/medial segmental left lobe measuring 8.9 x 7.8 x 10.3 cm. The other arising from the posterior aspect of the right lobe measuring 6.6 x 6.1 x 7.3 cm. Liver demonstrates a coarsened echotexture with a nodular surface contour. Liver normal in overall size. IVC: Limited visualization.  No gross abnormality. Pancreas: Limited visualization.  No gross abnormality. Spleen: Size and appearance within normal limits. Right Kidney: Length: 10.6 cm. Mild renal cortical  thinning. Echogenicity within normal limits. No mass or hydronephrosis visualized. Left Kidney: Length: 10.9 cm. Mild renal cortical thinning. Echogenicity within normal limits. No mass or hydronephrosis visualized. Abdominal aorta: Not well seen from the midportion to the bifurcation. Gross aneurysm. Other findings: Moderate amount of ascites similar to the previous day's study. Right  pleural effusion. IMPRESSION: 1. Appearance of the liver is consistent with cirrhosis. There are 2 large with her cystic masses, likely simple hepatic cysts. Consider follow-up liver MRI with and without contrast for further assessment and lesion characterization. 2. No acute findings. 3. Moderate ascites. Electronically Signed   By: Lajean Manes M.D.   On: 11/14/2015 15:43   ECHO 11/14/15: Study Conclusions  - Left ventricle: The cavity size was normal. There was mild focal  basal hypertrophy of the septum. Systolic function was mildly  reduced. The estimated ejection fraction was in the range of 45%  to 50%. Wall motion was normal; there were no regional wall  motion abnormalities. - Aortic valve: Trileaflet; mildly thickened, mildly calcified  leaflets. - Mitral valve: Calcified annulus. There was trivial regurgitation.   Medications: I have reviewed the patient's current medications. Scheduled Meds: . feeding supplement (ENSURE ENLIVE)  237 mL Oral BID BM  . fluconazole (DIFLUCAN) IV  100 mg Intravenous Q24H  . magic mouthwash  5 mL Oral TID  . [START ON 11/16/2015] metoprolol tartrate  12.5 mg Oral BID  . mometasone-formoterol  2 puff Inhalation BID  . ondansetron (ZOFRAN) IV  4 mg Intravenous Once  . pantoprazole  40 mg Oral Daily  . sertraline  25 mg Oral Daily  . sodium chloride flush  3 mL Intravenous Q12H  . sodium chloride flush  3 mL Intravenous Q12H   Continuous Infusions: . heparin 1,050 Units/hr (11/15/15 0600)   PRN Meds:.sodium chloride, acetaminophen **OR** acetaminophen, ALPRAZolam, HYDROcodone-acetaminophen, ondansetron **OR** ondansetron (ZOFRAN) IV, sodium chloride flush, traMADol  Assessment/Plan:  1. Massive fluid overload likely combination of acute on chronic diastolic heart failure and hypoalbuminemia - Cr went up slightly with diuresis. Cr 2.74 today with BUN 65 Lasix stopped             -- Nephrology has seen see below             --  Likely also need paracentesis as I doubt we can remove significant fluid even with aggressive diuresis - --did not put out much, was -1L on 80mg  BID lasix. Now off lasix                  --- + 624 yesterday and wt up 2 lbs.  2. Newly diagnosed atrial fibrillation - CHA2DS2-Vasc score 3 (HF, female, HTN) - IV heparin, likely will need coumadin prior to discharge, no NOAC given renal issue. Need to coordinate starting coumadin with IM and PCCM to see if she need paracentesis or thoracetesis  3. Recurrent R pleural effusion: per PCCM  4. Large ascites: may need paracentesis per CCM ---GI has seen possible new dx cirrhosis suspect etiology is NASH  5. Recent Klebsiella pneumonia UTI treated at [facility]  6. Possible sepsis with elevated WBC 22: per IM  7.  AoCKD:  Per renal:  "suspect cardiorenal syndrome vs. OP ARB use. FENa <0.1%, BUN/Cr >20:1, urine osmolality 501 making prerenal injury more likely than ATN. However, granular cast noted on UA. Cr up to 2.74 today after IV lasix. UOP 1.7L in 24hr. BP down to 80s/60s. Would hold Lasix for now. Fluid restrict.  Avoid nephrotixic meds"  8.  LV dysfunction 45-50% by Echo no regional wall motion abnormalities. Trivial mitral regurgitation  9. Elevated troponin 0.11 at pk, may be due to a fib demand ischemia  10. Hypotension  Systolic BP in [id number] today.  I added parameters to BB. Difficult need to control HR but BP low, with acute renal failure would not add dig.  Dr. Sallyanne Kuster to address.      LOS: 3 days   Time spent with pt. : 15 minutes. [facility] R  Nurse Practitioner Certified Pager [phone] or after 5pm and on weekends call [phone] 11/15/2015, 3:24 PM   I have seen and examined the patient along with [facility] R  NP.  I have reviewed the chart, notes and new data.  I agree with NP's note.  Key new complaints: still weak Key examination changes: marked ascites and anasarca Key  new findings / data: Abd [place] raises likelihood of cirrhosis. This may explain her resistance to diuretics and makes more "sense" with her current presentation than just diastolic heart failure/mild LV dysfunction.  PLAN: Hypotension and worsening renal function limit treatment of her hypovolemia and are poor prognostic signs. Diuretics and RAAS inhibitors are on hold. Possible paracentesis tomorrow. Consider albumin administration with paracentesis. Rate control is fair, albeit not perfect.  Sanda Klein, MD, [place] [phone] 11/15/2015, 6:18 PM

## 2015-11-15 NOTE — Progress Notes (Signed)
Report given to Marya Amsler RN in patient's room using SBAR format, reviewed orders, labs, VS, meds and patient's general condition, transferred care to her in stable condition

## 2015-11-15 NOTE — Consult Note (Signed)
[facility] Consult: 12:14 PM 11/15/2015  LOS: 3 days    Referring Provider: Curly Rim Primary Care Physician:  No PCP Per Patient Primary Gastroenterologist:  unassigned   Reason for Consultation:  Anasarca. Ascites and likley new dx cirrhosis   HPI: Kendra Martinez is a 82 y.o. female.  [place]  Morbidly obese.  Hx CHF.  HTN.  Bipolar disorder.  GERD.    ~ 2 weeks ago outside hospital admission with new dx of CHF, PNA and UTI.  After that relocated to [facility] Was ambulating with walker at discharge but now with recurrence of same sxs present before previous admission.    Came to ED with progressive dyspnea, LE edema, weakness.  Clinically has anasarca.  Right pleural effusion, large ascites on xray.   Afib, rate low 100s on ekg.  EF 45 to 50% by echo. No diastolic dysfunction.   Troponin to0.11, 0.08,  0.07.   AKI vs CKD stage 4 (GFR 53 in 08/2015).  WBCs 18 K.  60 K yeast on urine clx.   Ultrasound liver: 2 large, left, liver cysts, nodular liver, moderate ascites.  On chest CT the ascites and liver cysts also seen.  lfts normal aexcept low albumin and prealbumin Platelets ok.  coags 19/1.5.    basline weight ~ 245/250#, now 264. No unusual bleeding or bruising.  No previous indicators of liver disease.  Never had previous abd ultrasound, no prior EGD or colonoscopy.  Dx of GERD made by MD but never had confirmatory testing.  No ETOH.   Lifelong obesity.  No abd pain.  > one month of LE and abdominal swelling.  ~ 1 month decreased appetite.  No n/v as oupt.  Some nausea as inpt. No bleeding PR or melena.  Daily brown stools is her norm. + oliguria at home.     Past Medical History  Diagnosis Date  . CHF (congestive heart failure) ([place])   . Hypertension   . Pleural effusion  10/2015  . Shortness of breath dyspnea   . Bipolar disorder ([place])   . GERD (gastroesophageal reflux disease)   . Ascites   . 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 Cholecystectomy      Prior to Admission medications   Medication Sig Start Date End Date Taking? Authorizing Provider  albuterol (PROVENTIL HFA;VENTOLIN HFA) 108 (90 Base) MCG/ACT inhaler Inhale 1-2 puffs into the lungs every 4 (four) hours as needed for wheezing or shortness of breath.   Yes Historical Provider, MD  ALPRAZolam (XANAX) 0.25 MG tablet Take 0.25 mg by mouth 3 (three) times daily as needed for anxiety.   Yes Historical Provider, MD  fluticasone furoate-vilanterol (BREO ELLIPTA) 100-25 MCG/INH AEPB Inhale 1 puff into the lungs daily.   Yes Historical Provider, MD  metoprolol tartrate (LOPRESSOR) 25 MG tablet Take 25 mg by mouth 2 (two) times daily.   Yes Historical Provider, MD  mometasone-formoterol (DULERA) 200-5 MCG/ACT AERO Inhale 2 puffs into  the lungs 2 (two) times daily.   Yes Historical Provider, MD  omeprazole (PRILOSEC) 20 MG capsule Take 20 mg by mouth daily.   Yes Historical Provider, MD  ondansetron (ZOFRAN) 4 MG tablet Take 4 mg by mouth daily as needed for nausea.   Yes Historical Provider, MD  promethazine (PHENERGAN) 25 MG tablet Take 25 mg by mouth every 8 (eight) hours as needed for nausea or vomiting.   Yes Historical Provider, MD  sertraline (ZOLOFT) 25 MG tablet Take 25 mg by mouth daily.   Yes Historical Provider, MD  tiotropium (SPIRIVA) 18 MCG inhalation capsule Place 18 mcg into inhaler and inhale daily.   Yes Historical Provider, MD  Tiotropium Bromide Monohydrate (SPIRIVA RESPIMAT) 2.5 MCG/ACT AERS Inhale 2 puffs into the lungs daily.    Yes Historical Provider, MD  traMADol (ULTRAM) 50 MG tablet Take 50 mg by mouth every 12 (twelve) hours as needed for moderate pain.   Yes Historical Provider, MD    Scheduled Meds: . feeding supplement (ENSURE  ENLIVE)  237 mL Oral BID BM  . fluconazole (DIFLUCAN) IV  100 mg Intravenous Q24H  . magic mouthwash  5 mL Oral TID  . metoprolol tartrate  25 mg Oral BID  . mometasone-formoterol  2 puff Inhalation BID  . ondansetron (ZOFRAN) IV  4 mg Intravenous Once  . pantoprazole  40 mg Oral Daily  . sertraline  25 mg Oral Daily  . sodium chloride flush  3 mL Intravenous Q12H  . sodium chloride flush  3 mL Intravenous Q12H   Infusions: . heparin 1,050 Units/hr (11/15/15 0600)   PRN Meds: sodium chloride, acetaminophen **OR** acetaminophen, ALPRAZolam, HYDROcodone-acetaminophen, ondansetron **OR** ondansetron (ZOFRAN) IV, sodium chloride flush, traMADol   Allergies as of 11/12/2015 - Review Complete 11/12/2015  Allergen Reaction Noted  . Sulfa antibiotics Hives 11/12/2015    Family History  Problem Relation Age of Onset  . Kidney cancer Mother   . COPD Father   . Diabetes type II Other   . Stroke Neg Hx     Social History   Social History  . Marital Status: Married    Spouse Name: N/A  . Number of Children: N/A  . Years of Education: N/A   Occupational History  . Not on file.   Social History Main Topics  . Smoking status: Never Smoker   . Smokeless tobacco: Never Used  . Alcohol Use: No  . Drug Use: No  . Sexual Activity: Not on file   Other Topics Concern  . Not on file   Social History Narrative    REVIEW OF SYSTEMS: Constitutional:  Per HPI ENT:  No nose bleeds Pulm:  Per HPI CV:  No palpitations, no LE edema.  GU:  No hematuria, no frequency GI:  Per HPI.  No dysphagis Heme:  No previous anemia   Transfusions:  Never.  Neuro:  No headaches, no peripheral tingling or numbness Derm:  No itching, no rash or sores.  Endocrine:  No sweats or chills.  No polyuria or dysuria Immunization:  Not queried. Travel:  None beyond local counties in last few months.    PHYSICAL EXAM: Vital signs in last 24 hours: Filed Vitals:   11/15/15 0800 11/15/15 1150  BP:  114/68 83/60  Pulse: 66 68  Temp: 97.8 F (36.6 C) 97.3 F (36.3 C)  Resp: 20 19   Wt Readings from Last 3 Encounters:  11/15/15 121.564 kg (268 lb)   General: ill, weak, obese WF.  comfortalble but sob Head:  No asymmetry, no swelling  Eyes:  No icterus or pallor Ears:  Slightly HOH  Nose:  No discharge or congestion.   Mouth:  Clear, moist, tongue midline, good dentition Neck:  No JVD.  No mass or TMB Lungs:  Absent on right and in left base.  No rales.  Voice hoarse and weak Heart: RRR.  No mrg.  S1/S2 present.   Abdomen:  Obese, soft, no obvious ascites.  No mass or HSM appreciated.  Not tender.  BS hypoactive.   Rectal: deferred   Musc/Skeltl: no joint redness or gross deformity Extremities:  + 3+ LE edema and anasarca  Neurologic:  Oriented x 3.  No tremor or asterixis. Moves all 4s.  Skin:  No telangectasia. sores or rash Tattoos:  none   Psych:  Affect flat, not anxious or agitated.  Cooperative.   Intake/Output from previous day: 04/19 0701 - 04/20 0700 In: 1384.6 [P.O.:420; I.V.:264.6; IV Piggyback:700] Out: 750 [Urine:750]  LAB RESULTS:  Recent Labs  11/13/15 0440 11/14/15 0622 11/15/15 0346  WBC 18.9* 18.5* 18.8*  HGB 12.5 12.1 11.7*  HCT 37.9 37.1 36.1  PLT 217 211 223   BMET Lab Results  Component Value Date   NA 136 11/15/2015   NA 133* 11/14/2015   NA 132* 11/13/2015   K 3.9 11/15/2015   K 4.4 11/14/2015   K 4.1 11/13/2015   CL 97* 11/15/2015   CL 94* 11/14/2015   CL 96* 11/13/2015   CO2 21* 11/15/2015   CO2 21* 11/14/2015   CO2 19* 11/13/2015   GLUCOSE 103* 11/15/2015   GLUCOSE 109* 11/14/2015   GLUCOSE 116* 11/13/2015   BUN 65* 11/15/2015   BUN 63* 11/14/2015   BUN 52* 11/13/2015   CREATININE 2.73* 11/15/2015   CREATININE 2.74* 11/14/2015   CREATININE 2.47* 11/13/2015   CALCIUM 8.0* 11/15/2015   CALCIUM 8.1* 11/14/2015   CALCIUM 8.0* 11/13/2015   LFT  Recent Labs  11/12/15 2330 11/13/15 0440  PROT 5.2* 5.3*  ALBUMIN  2.2* 2.3*  AST 21 25  ALT 15 18  ALKPHOS 55 59  BILITOT 1.1 1.4*   PT/INR Lab Results  Component Value Date   INR 1.59* 11/13/2015    RADIOLOGY STUDIES: images viewdd Dg Chest 2 View  11/14/2015  CLINICAL DATA:  Acute respiratory failure with hypoxemia. EXAM: CHEST  2 VIEW COMPARISON:  November 12, 2015. FINDINGS: Stable cardiomediastinal silhouette. No pneumothorax is noted. Left lung is clear. Increased right midlung opacity is noted concerning for subsegmental atelectasis mild right pleural effusion remains. Stable presence of calcified loose body in right shoulder area. IMPRESSION: Increased right midlung opacity is noted most consistent with subsegmental atelectasis. Mild right pleural effusion is noted as well. Electronically Signed   By: Marijo Conception, M.D.   On: 11/14/2015 07:40   US Abdomen Complete  11/14/2015  CLINICAL DATA:  Ascites. Evaluate liver. Acute renal failure. History of hypertension and congestive heart failure. History of a cholecystectomy. EXAM: ABDOMEN ULTRASOUND COMPLETE COMPARISON:  11/13/2015 FINDINGS: Gallbladder: Surgically absent. Common bile duct: Diameter: 6 mm Liver: 2 large cystic areas, 1 projecting along the anterior segment right lobe/medial segmental left lobe measuring 8.9 x 7.8 x 10.3 cm. The other arising from the posterior aspect of the right lobe measuring 6.6 x 6.1 x 7.3 cm. Liver demonstrates a coarsened echotexture with a nodular surface contour. Liver normal in overall size. IVC: Limited visualization.  No gross abnormality. Pancreas: Limited visualization.  No gross  abnormality. Spleen: Size and appearance within normal limits. Right Kidney: Length: 10.6 cm. Mild renal cortical thinning. Echogenicity within normal limits. No mass or hydronephrosis visualized. Left Kidney: Length: 10.9 cm. Mild renal cortical thinning. Echogenicity within normal limits. No mass or hydronephrosis visualized. Abdominal aorta: Not well seen from the midportion to the  bifurcation. Gross aneurysm. Other findings: Moderate amount of ascites similar to the previous day's study. Right pleural effusion. IMPRESSION: 1. Appearance of the liver is consistent with cirrhosis. There are 2 large with her cystic masses, likely simple hepatic cysts. Consider follow-up liver MRI with and without contrast for further assessment and lesion characterization. 2. No acute findings. 3. Moderate ascites. Electronically Signed   By: Lajean Manes M.D.   On: 11/14/2015 15:43    ENDOSCOPIC STUDIES: Per HPI  IMPRESSION:   *  Anasarca, ascites in setting of new dx cirrhosis and large liver cysts.   Platelets ok, + milt coaguopathy.   Suspect etiology is NASH, previously undiagnosed.   *  CHF. Right pleural effusion.   *  Severe protein cal malnutriton.  Prealbumin 5.9    PLAN:     *  Per Dr Carlean Purl.  Tune up CHF as best we can.  Nutritionist to help with diet, low protein.    For ascites, favor Lasix/aldactone as diuretics. But with current AKI, hold off on these.   *  Needs paracentesis for comfort and studies.  Thoracentesis planned tomorrow.  ? FNA of liver cysts.   Azucena Freed  11/15/2015, 12:14 PM Pager: [place] Attending   I have taken an interval history, reviewed the chart and examined the patient. I agree with the Advanced Practitioner's note, impression and recommendations.   Cirrhosis likely from NASH with vol overload (ascites) and the right pl effusion could be hepatic hydrothorax vs CHF (or both).  Cystic lesions in liver should be better evaluated with MR or perhaps CT (not sure she could hold her breath well enough for MR)  Gatha Mayer, MD, [facility] [phone] (pager) [phone] after 5 PM, weekends and holidays  11/15/2015 9:55 PM

## 2015-11-15 NOTE — NC FL2 (Signed)
[place] LEVEL OF CARE SCREENING TOOL     IDENTIFICATION  Patient Name: Kendra Martinez Birthdate: September 19, 1933 Sex: female Admission Date (Current Location): 11/12/2015  [facility] and [place] Number:  Herbalist and Address:  [facility]. [facility], [place] [address], [place], [place] [address]      Provider Number: [id number]  Attending Physician Name and Address:  Bonnielee Haff, MD  Relative Name and Phone Number:  Chriss Czar, spouse, [phone]    Current Level of Care: Hospital Recommended Level of Care: [place] Prior Approval Number:    Date Approved/Denied:   PASRR Number: [id number]  Discharge Plan: SNF    Current Diagnoses: Patient Active Problem List   Diagnosis Date Noted  . ARF (acute renal failure) ([place])   . Cirrhosis of liver with ascites ([place])   . Elevated troponin 11/13/2015  . Acute respiratory failure with hypoxemia ([place])   . Ascites   . Acute on chronic diastolic CHF (congestive heart failure), NYHA class 4 ([place])   . Pleural effusion 11/12/2015  . Essential hypertension 11/12/2015  . Hyponatremia 11/12/2015  . Sepsis ([place]) 11/12/2015  . Anasarca 11/12/2015  . New onset a-fib ([place]) 11/12/2015  . Abdominal distention   . Hypoxia   . Increased anion gap metabolic acidosis   . Lactic acidosis   . Acute respiratory failure with hypoxia ([facility])     Orientation RESPIRATION BLADDER Height & Weight     Self, Situation, Place  O2 (2L/min) Continent Weight: 268 lb (121.564 kg) Height:  5\' 3"  (160 cm)  BEHAVIORAL SYMPTOMS/MOOD NEUROLOGICAL BOWEL NUTRITION STATUS      Incontinent Diet (Please see DC summary)  AMBULATORY STATUS COMMUNICATION OF NEEDS Skin   Extensive Assist Verbally Other (Comment) (Wound on thigh)                       Personal Care Assistance Level of Assistance  Bathing, Feeding, Dressing Bathing Assistance: Maximum assistance Feeding assistance: Limited assistance Dressing  Assistance: Maximum assistance     Functional Limitations Info             SPECIAL CARE FACTORS FREQUENCY  PT (By licensed PT), OT (By licensed OT)     PT Frequency: min 2x/week OT Frequency: min 2x/week            Contractures      Additional Factors Info  Code Status, Allergies, Psychotropic Code Status Info: Full Allergies Info: Sulfa Antibiotics Psychotropic Info: Zoloft         Current Medications (11/15/2015):  This is the current hospital active medication list Current Facility-Administered Medications  Medication Dose Route Frequency Provider Last Rate Last Dose  . 0.9 %  sodium chloride infusion  250 mL Intravenous PRN Toy Baker, MD      . acetaminophen (TYLENOL) tablet 650 mg  650 mg Oral Q6H PRN Toy Baker, MD   650 mg at 11/15/15 1642   Or  . acetaminophen (TYLENOL) suppository 650 mg  650 mg Rectal Q6H PRN Toy Baker, MD      . ALPRAZolam Duanne Moron) tablet 0.25 mg  0.25 mg Oral TID PRN Toy Baker, MD   0.25 mg at 11/14/15 2214  . feeding supplement (BOOST / RESOURCE BREEZE) liquid 1 Container  1 Container Oral BID BM Kimberly B Hammons, RPH      . fluconazole (DIFLUCAN) IVPB 100 mg  100 mg Intravenous Q24H Forde Dandy, MD   100 mg at 11/14/15 2213  .  heparin ADULT infusion 100 units/mL (25000 units/250 mL)  1,050 Units/hr Intravenous Continuous Erick Colace, NP 10.5 mL/hr at 11/15/15 0600 1,050 Units/hr at 11/15/15 0600  . HYDROcodone-acetaminophen (NORCO/VICODIN) 5-325 MG per tablet 1-2 tablet  1-2 tablet Oral Q4H PRN Toy Baker, MD   1 tablet at 11/14/15 1745  . magic mouthwash  5 mL Oral TID Margarita Mail, PA-C   5 mL at 11/15/15 1643  . [START ON 11/16/2015] metoprolol tartrate (LOPRESSOR) tablet 12.5 mg  12.5 mg Oral BID Isaiah Serge, NP      . mometasone-formoterol [facility]) 200-5 MCG/ACT inhaler 2 puff  2 puff Inhalation BID Toy Baker, MD   2 puff at 11/15/15 0834  . ondansetron (ZOFRAN) injection 4  mg  4 mg Intravenous Once Margarita Mail, PA-C   4 mg at 11/13/15 0411  . ondansetron (ZOFRAN) tablet 4 mg  4 mg Oral Q6H PRN Toy Baker, MD   4 mg at 11/14/15 1745   Or  . ondansetron (ZOFRAN) injection 4 mg  4 mg Intravenous Q6H PRN Toy Baker, MD   4 mg at 11/15/15 0043  . pantoprazole (PROTONIX) EC tablet 40 mg  40 mg Oral Daily Toy Baker, MD   40 mg at 11/15/15 1020  . sertraline (ZOLOFT) tablet 25 mg  25 mg Oral Daily Toy Baker, MD   25 mg at 11/15/15 1020  . sodium chloride flush (NS) 0.9 % injection 3 mL  3 mL Intravenous Q12H Toy Baker, MD   3 mL at 11/13/15 0422  . sodium chloride flush (NS) 0.9 % injection 3 mL  3 mL Intravenous Q12H Toy Baker, MD   3 mL at 11/13/15 1208  . sodium chloride flush (NS) 0.9 % injection 3 mL  3 mL Intravenous PRN Toy Baker, MD      . traMADol (ULTRAM) tablet 50 mg  50 mg Oral Q12H PRN Toy Baker, MD         Discharge Medications: Please see discharge summary for a list of discharge medications.  Relevant Imaging Results:  Relevant Lab Results:   Additional Information SSN: [place], [place]

## 2015-11-15 NOTE — Progress Notes (Signed)
Report received via Josh RN in patient's room using SBAR format, updated on today's events, tests, VS and general condition, assumed care of patient

## 2015-11-15 NOTE — Progress Notes (Signed)
[facility] PROGRESS NOTE  Shamanda Niziolek [id number] DOB: [birth year] DOA: 11/12/2015  PCP: She has recently relocated to this area. Does not have any providers here.  Brief HPI: 82 year old Caucasian female with a past medical history of hypertension, diastolic CHF, recently treated pneumonia at an outside facility, presented with was needing shortness of breath. Patient is new to [place]. She has recently relocated to this area. Does not see any providers here.  Past medical history:  Past Medical History  Diagnosis Date  . CHF (congestive heart failure) ([place])   . Hypertension   . Pleural effusion 10/2015  . Shortness of breath dyspnea   . Bipolar disorder ([place])   . GERD (gastroesophageal reflux disease)   . Ascites   . Arthritis     oa in knees    Consultants: Cardiology. Pulmonology. Nephrology. Gastroenterology  Procedures:  Transthoracic echocardiogram Study Conclusions - Left ventricle: The cavity size was normal. There was mild focal basal hypertrophy of the septum. Systolic function was mildly reduced. The estimated ejection fraction was in the range of 45% to 50%. Wall motion was normal; there were no regional wall motion abnormalities. - Aortic valve: Trileaflet; mildly thickened, mildly calcified leaflets. - Mitral valve: Calcified annulus. There was trivial regurgitation.   Antibiotics: Vancomycin and Zosyn 4/17--4/20  Subjective: Patient continues to feel weak and fatigued. Still feels quite bloated and swollen. Denies abdominal pain. No nausea or vomiting. No chest pain. Still with some shortness of breath. Her husband is at the bedside.   Objective:  Vital Signs  Filed Vitals:   11/15/15 0000 11/15/15 0400 11/15/15 0800 11/15/15 0834  BP: 90/67 101/36 114/68   Pulse: 90 87 66   Temp: 97.5 F (36.4 C) 98.2 F (36.8 C) 97.8 F (36.6 C)   TempSrc: Oral Axillary Oral   Resp: 17 18 20    Height:      Weight:  121.564 kg (268 lb)    SpO2: 99%  98% 98% 92%    Intake/Output Summary (Last 24 hours) at 11/15/15 1003 Last data filed at 11/15/15 0700  Gross per 24 hour  Intake 1144.57 ml  Output    750 ml  Net 394.57 ml   Filed Weights   11/13/15 1644 11/14/15 0451 11/15/15 0400  Weight: 120.1 kg (264 lb 12.4 oz) 120.702 kg (266 lb 1.6 oz) 121.564 kg (268 lb)    General appearance: alert, cooperative, appears stated age and no distress. Fatigued. Resp: Diminished air entry in the right. Dullness to percussion. No definite crackles. Cardio: regular rate and rhythm, S1, S2 normal, no murmur, click, rub or gallop GI: soft, non-tender; bowel sounds normal; no masses,  no organomegaly Extremities: extremities normal, atraumatic, no cyanosis or edema Neurologic: Alert and oriented X 3. No focal deficits.  Lab Results:  Data Reviewed: I have personally reviewed following labs and imaging studies  CBC:  Recent Labs Lab 11/12/15 1619 11/13/15 0440 11/14/15 0622 11/15/15 0346  WBC 22.3* 18.9* 18.5* 18.8*  NEUTROABS 19.9*  --   --   --   HGB 13.0 12.5 12.1 11.7*  HCT 39.2 37.9 37.1 36.1  MCV 92.7 92.7 93.0 92.3  PLT 219 217 211 [id number]   Basic Metabolic Panel:  Recent Labs Lab 11/12/15 1619 11/12/15 2330 11/13/15 0440 11/14/15 0622 11/15/15 0346  NA 131* 133* 132* 133* 136  K 4.6 4.4 4.1 4.4 3.9  CL 93* 97* 96* 94* 97*  CO2 21* 22 19* 21* 21*  GLUCOSE 124* 97 116* 109* 103*  BUN 54* 52* 52* 63* 65*  CREATININE 2.56* 2.37* 2.47* 2.74* 2.73*  CALCIUM 8.8* 7.9* 8.0* 8.1* 8.0*  MG  --   --  1.6*  --   --   PHOS  --   --  3.9  --   --    GFR: Estimated Creatinine Clearance: 21.1 mL/min (by C-G formula based on Cr of 2.73). Liver Function Tests:  Recent Labs Lab 11/12/15 2330 11/13/15 0440  AST 21 25  ALT 15 18  ALKPHOS 55 59  BILITOT 1.1 1.4*  PROT 5.2* 5.3*  ALBUMIN 2.2* 2.3*   Coagulation Profile:  Recent Labs Lab 11/13/15 0440  INR 1.59*   Cardiac Enzymes:  Recent Labs Lab 11/12/15 2330  11/13/15 0440 11/13/15 1120  TROPONINI 0.11* 0.08* 0.07*   Thyroid Function Tests:  Recent Labs  11/13/15 0440  TSH 3.124   Urine analysis:    Component Value Date/Time   COLORURINE YELLOW 11/13/2015 1132   APPEARANCEUR CLOUDY* 11/13/2015 1132   LABSPEC 1.023 11/13/2015 1132   PHURINE 5.5 11/13/2015 1132   GLUCOSEU NEGATIVE 11/13/2015 1132   HGBUR NEGATIVE 11/13/2015 1132   BILIRUBINUR MODERATE* 11/13/2015 1132   [place] 11/13/2015 1132   PROTEINUR 30* 11/13/2015 1132   NITRITE NEGATIVE 11/13/2015 1132   LEUKOCYTESUR MODERATE* 11/13/2015 1132    Recent Results (from the past 240 hour(s))  Blood culture (routine x 2)     Status: None (Preliminary result)   Collection Time: 11/12/15  8:20 PM  Result Value Ref Range Status   Specimen Description BLOOD RIGHT ANTECUBITAL  Final   Special Requests IN PEDIATRIC BOTTLE 3CC  Final   Culture NO GROWTH 2 DAYS  Final   Report Status PENDING  Incomplete  Blood culture (routine x 2)     Status: None (Preliminary result)   Collection Time: 11/12/15  8:25 PM  Result Value Ref Range Status   Specimen Description BLOOD RIGHT HAND  Final   Special Requests IN PEDIATRIC BOTTLE 3CC  Final   Culture NO GROWTH 2 DAYS  Final   Report Status PENDING  Incomplete  Urine culture     Status: Abnormal   Collection Time: 11/13/15 11:32 AM  Result Value Ref Range Status   Specimen Description URINE, CLEAN CATCH  Final   Special Requests NONE  Final   Culture 60,000 COLONIES/mL YEAST (A)  Final   Report Status 11/14/2015 FINAL  Final  MRSA PCR Screening     Status: None   Collection Time: 11/13/15  5:21 PM  Result Value Ref Range Status   MRSA by PCR NEGATIVE NEGATIVE Final    Comment:        The GeneXpert MRSA Assay (FDA approved for NASAL specimens only), is one component of a comprehensive MRSA colonization surveillance program. It is not intended to diagnose MRSA infection nor to guide or monitor treatment for MRSA  infections.       Radiology Studies: Dg Chest 2 View  11/14/2015  CLINICAL DATA:  Acute respiratory failure with hypoxemia. EXAM: CHEST  2 VIEW COMPARISON:  November 12, 2015. FINDINGS: Stable cardiomediastinal silhouette. No pneumothorax is noted. Left lung is clear. Increased right midlung opacity is noted concerning for subsegmental atelectasis mild right pleural effusion remains. Stable presence of calcified loose body in right shoulder area. IMPRESSION: Increased right midlung opacity is noted most consistent with subsegmental atelectasis. Mild right pleural effusion is noted as well. Electronically Signed   By: Marijo Conception, M.D.   On: 11/14/2015  07:40   US Abdomen Complete  11/14/2015  CLINICAL DATA:  Ascites. Evaluate liver. Acute renal failure. History of hypertension and congestive heart failure. History of a cholecystectomy. EXAM: ABDOMEN ULTRASOUND COMPLETE COMPARISON:  11/13/2015 FINDINGS: Gallbladder: Surgically absent. Common bile duct: Diameter: 6 mm Liver: 2 large cystic areas, 1 projecting along the anterior segment right lobe/medial segmental left lobe measuring 8.9 x 7.8 x 10.3 cm. The other arising from the posterior aspect of the right lobe measuring 6.6 x 6.1 x 7.3 cm. Liver demonstrates a coarsened echotexture with a nodular surface contour. Liver normal in overall size. IVC: Limited visualization.  No gross abnormality. Pancreas: Limited visualization.  No gross abnormality. Spleen: Size and appearance within normal limits. Right Kidney: Length: 10.6 cm. Mild renal cortical thinning. Echogenicity within normal limits. No mass or hydronephrosis visualized. Left Kidney: Length: 10.9 cm. Mild renal cortical thinning. Echogenicity within normal limits. No mass or hydronephrosis visualized. Abdominal aorta: Not well seen from the midportion to the bifurcation. Gross aneurysm. Other findings: Moderate amount of ascites similar to the previous day's study. Right pleural effusion.  IMPRESSION: 1. Appearance of the liver is consistent with cirrhosis. There are 2 large with her cystic masses, likely simple hepatic cysts. Consider follow-up liver MRI with and without contrast for further assessment and lesion characterization. 2. No acute findings. 3. Moderate ascites. Electronically Signed   By: Lajean Manes M.D.   On: 11/14/2015 15:43     Medications:  Scheduled: . feeding supplement (ENSURE ENLIVE)  237 mL Oral BID BM  . fluconazole (DIFLUCAN) IV  100 mg Intravenous Q24H  . magic mouthwash  5 mL Oral TID  . metoprolol tartrate  25 mg Oral BID  . mometasone-formoterol  2 puff Inhalation BID  . ondansetron (ZOFRAN) IV  4 mg Intravenous Once  . pantoprazole  40 mg Oral Daily  . piperacillin-tazobactam (ZOSYN)  IV  3.375 g Intravenous Q8H  . sertraline  25 mg Oral Daily  . sodium chloride flush  3 mL Intravenous Q12H  . sodium chloride flush  3 mL Intravenous Q12H  . vancomycin  1,500 mg Intravenous Q48H   Continuous: . heparin 1,050 Units/hr (11/15/15 0600)   [id number] chloride, acetaminophen **OR** acetaminophen, ALPRAZolam, HYDROcodone-acetaminophen, ondansetron **OR** ondansetron (ZOFRAN) IV, sodium chloride flush, traMADol  Assessment/Plan:  Principal Problem:   Acute on chronic diastolic CHF (congestive heart failure), NYHA class 4 (HCC) Active Problems:   Pleural effusion   Essential hypertension   Hyponatremia   Sepsis ([place])   Anasarca   New onset a-fib (HCC)   Elevated troponin   Acute respiratory failure with hypoxemia (HCC)   Ascites    Right-sided pleural effusion Etiology is unclear. Pulmonology is following. Thought to be secondary to a cardiac process rather than de novo pulmonary pathology. CT scan of the chest did not show any infiltrates. Echocardiogram only showed minimally depressed ejection fraction. PA pressure is noted to be normal. Might benefit from a diagnostic thoracentesis. Discussed briefly with pulmonology, PA.  Anasarca  with ascites, pleural effusion,  According to patient reports she could've gained approximately 40-50 pounds or even more over the last many months. Reason for her presentation is not entirely clear. Echocardiogram shows EF of 45-50%. PA pressures are normal. Ultrasound did show evidence for liver cirrhosis. She has hypoalbuminemia. Her PT/INR is slightly elevated. Patient denies any history of liver disease, hepatitis. We'll check hepatitis panel. Discussed with gastroenterology, who will consult. Lasix was discontinued yesterday due to worsening creatinine. No abdominal tenderness on  examination. Initially paracentesis was considered. However, due to renal failure, this was deferred.   Liver cirrhosis of unclear etiology/hepatic cysts Etiology unclear. Gastroenterology has been consulted. Ultrasound report reviewed. Liver cysts also noted. May need MRI at some point. She is hypoalbuminemic. PT/INR was elevated. Repeat tomorrow morning. Hepatitis panel ordered.  Possible acute on chronic kidney disease Baseline renal function is not known. It was 2.5 and she was discharged from the outside facility. Creatinine has climbed. Some proteinuria noted on UA. Nephrology is following. Considering her edema, ascites, she may benefit from diuretics. Will discuss with nephrology. Ultrasound of the abdomen was done does not show any hydronephrosis.   New onset atrial fibrillation Cardiology is following. Echocardiogram is as above. TSH is 3.12. CHA2D vas2c score is 3. Patient started on intravenous heparin.  Recent Pneumonia Diagnose at an outside facility. She has completed treatment. No clear evidence for infectious process at this time. She does have an elevated WBC, but she is afebrile. Cultures negative so far. Okay to discontinue antibiotics for now.  Mildly elevated troponin Likely demand ischemia. Further management per cardiology.  Essential hypertension Monitor blood pressures closely. Blood  pressure at times borderline low. Continue beta blocker for now due to her A. fib.  DVT Prophylaxis: On IV heparin    Code Status: Full Code  Family Communication: Discussed with patient and her husband  Disposition Plan: Management as outlined above. Mobilize as tolerated.    LOS: 3 days   [facility] Pager [phone] 11/15/2015, 10:03 AM  If 7PM-7AM, please contact night-coverage at [email or web address], password [facility]

## 2015-11-15 NOTE — Consult Note (Signed)
[facility] Renal Consultation Note  Requesting MD:  [facility] Indication for Consultation: Acute on Chronic Kidney Disease  HPI:  Kendra Martinez is a 82 y.o. female with a PMH significant for dCHF and recent admission for PNA (Klebsiella) at an outside hospital presenting with SOB and worsening bilateral edema over the past few weeks. Patient presented significantly volume overloaded and renal function has not tolerated diuretics well.  CREATININE, SER  Date/Time Value Ref Range Status  11/15/2015 03:46 AM 2.73* 0.44 - 1.00 mg/dL Final  11/14/2015 06:22 AM 2.74* 0.44 - 1.00 mg/dL Final  11/13/2015 04:40 AM 2.47* 0.44 - 1.00 mg/dL Final  11/12/2015 11:30 PM 2.37* 0.44 - 1.00 mg/dL Final  11/12/2015 04:19 PM 2.56* 0.44 - 1.00 mg/dL Final     PMHx:   Past Medical History  Diagnosis Date  . CHF (congestive heart failure) ([place])   . Hypertension   . Pleural effusion 10/2015  . Bipolar disorder ([place])   . GERD (gastroesophageal reflux disease)   . Ascites   . 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 Cholecystectomy      Family Hx:  Family History  Problem Relation Age of Onset  . Kidney cancer Mother   . COPD Father   . Diabetes type II Other   . Stroke Neg Hx     Social History:  reports that she has never smoked. She has never used smokeless tobacco. She reports that she does not drink alcohol or use illicit drugs.  Allergies:  Allergies  Allergen Reactions  . Sulfa Antibiotics Hives    Medications: Prior to Admission medications   Medication Sig Start Date End Date Taking? Authorizing Provider  albuterol (PROVENTIL HFA;VENTOLIN HFA) 108 (90 Base) MCG/ACT inhaler Inhale 1-2 puffs into the lungs every 4 (four) hours as needed for wheezing or shortness of breath.   Yes Historical Provider, MD  ALPRAZolam (XANAX) 0.25 MG tablet Take 0.25 mg by mouth 3 (three) times daily as needed for anxiety.   Yes Historical Provider,  MD  fluticasone furoate-vilanterol (BREO ELLIPTA) 100-25 MCG/INH AEPB Inhale 1 puff into the lungs daily.   Yes Historical Provider, MD  metoprolol tartrate (LOPRESSOR) 25 MG tablet Take 25 mg by mouth 2 (two) times daily.   Yes Historical Provider, MD  mometasone-formoterol (DULERA) 200-5 MCG/ACT AERO Inhale 2 puffs into the lungs 2 (two) times daily.   Yes Historical Provider, MD  omeprazole (PRILOSEC) 20 MG capsule Take 20 mg by mouth daily.   Yes Historical Provider, MD  ondansetron (ZOFRAN) 4 MG tablet Take 4 mg by mouth daily as needed for nausea.   Yes Historical Provider, MD  promethazine (PHENERGAN) 25 MG tablet Take 25 mg by mouth every 8 (eight) hours as needed for nausea or vomiting.   Yes Historical Provider, MD  sertraline (ZOLOFT) 25 MG tablet Take 25 mg by mouth daily.   Yes Historical Provider, MD  tiotropium (SPIRIVA) 18 MCG inhalation capsule Place 18 mcg into inhaler and inhale daily.   Yes Historical Provider, MD  Tiotropium Bromide Monohydrate (SPIRIVA RESPIMAT) 2.5 MCG/ACT AERS Inhale 2 puffs into the lungs daily.    Yes Historical Provider, MD  traMADol (ULTRAM) 50 MG tablet Take 50 mg by mouth every 12 (twelve) hours as needed for moderate pain.   Yes Historical Provider, MD    I have reviewed the patient's current medications.  Labs:  Results for orders placed or performed during  the hospital encounter of 11/12/15 (from the past 48 hour(s))  MRSA PCR Screening     Status: None   Collection Time: 11/13/15  5:21 PM  Result Value Ref Range   MRSA by PCR NEGATIVE NEGATIVE    Comment:        The GeneXpert MRSA Assay (FDA approved for NASAL specimens only), is one component of a comprehensive MRSA colonization surveillance program. It is not intended to diagnose MRSA infection nor to guide or monitor treatment for MRSA infections.   Heparin level (unfractionated)     Status: None   Collection Time: 11/13/15  6:21 PM  Result Value Ref Range   Heparin  Unfractionated 0.58 0.30 - 0.70 IU/mL    Comment:        IF HEPARIN RESULTS ARE BELOW EXPECTED VALUES, AND PATIENT DOSAGE HAS BEEN CONFIRMED, SUGGEST FOLLOW UP TESTING OF ANTITHROMBIN III LEVELS.   Heparin level (unfractionated)     Status: None   Collection Time: 11/14/15  6:22 AM  Result Value Ref Range   Heparin Unfractionated 0.66 0.30 - 0.70 IU/mL    Comment:        IF HEPARIN RESULTS ARE BELOW EXPECTED VALUES, AND PATIENT DOSAGE HAS BEEN CONFIRMED, SUGGEST FOLLOW UP TESTING OF ANTITHROMBIN III LEVELS.   CBC     Status: Abnormal   Collection Time: 11/14/15  6:22 AM  Result Value Ref Range   WBC 18.5 (H) 4.0 - 10.5 K/uL   RBC 3.99 3.87 - 5.11 MIL/uL   Hemoglobin 12.1 12.0 - 15.0 g/dL   HCT 37.1 36.0 - 46.0 %   MCV 93.0 78.0 - 100.0 fL   MCH 30.3 26.0 - 34.0 pg   MCHC 32.6 30.0 - 36.0 g/dL   RDW 14.6 11.5 - 15.5 %   Platelets 211 150 - 400 K/uL  Basic metabolic panel     Status: Abnormal   Collection Time: 11/14/15  6:22 AM  Result Value Ref Range   Sodium 133 (L) 135 - 145 mmol/L   Potassium 4.4 3.5 - 5.1 mmol/L   Chloride 94 (L) 101 - 111 mmol/L   CO2 21 (L) 22 - 32 mmol/L   Glucose, Bld 109 (H) 65 - 99 mg/dL   BUN 63 (H) 6 - 20 mg/dL   Creatinine, Ser 2.74 (H) 0.44 - 1.00 mg/dL   Calcium 8.1 (L) 8.9 - 10.3 mg/dL   GFR calc non Af Amer 15 (L) >60 mL/min   GFR calc Af Amer 18 (L) >60 mL/min    Comment: (NOTE) The eGFR has been calculated using the CKD EPI equation. This calculation has not been validated in all clinical situations. eGFR's persistently <60 mL/min signify possible Chronic Kidney Disease.    Anion gap 18 (H) 5 - 15  Procalcitonin     Status: None   Collection Time: 11/15/15 12:27 AM  Result Value Ref Range   Procalcitonin 0.71 ng/mL    Comment:        Interpretation: PCT > 0.5 ng/mL and <= 2 ng/mL: Systemic infection (sepsis) is possible, but other conditions are known to elevate PCT as well. (NOTE)         ICU PCT Algorithm                Non ICU PCT Algorithm    ----------------------------     ------------------------------         PCT < 0.25 ng/mL  PCT < 0.1 ng/mL     Stopping of antibiotics            Stopping of antibiotics       strongly encouraged.               strongly encouraged.    ----------------------------     ------------------------------       PCT level decrease by               PCT < 0.25 ng/mL       >= 80% from peak PCT       OR PCT 0.25 - 0.5 ng/mL          Stopping of antibiotics                                             encouraged.     Stopping of antibiotics           encouraged.    ----------------------------     ------------------------------       PCT level decrease by              PCT >= 0.25 ng/mL       < 80% from peak PCT        AND PCT >= 0.5 ng/mL             Continuing antibiotics                                              encouraged.       Continuing antibiotics            encouraged.    ----------------------------     ------------------------------     PCT level increase compared          PCT > 0.5 ng/mL         with peak PCT AND          PCT >= 0.5 ng/mL             Escalation of antibiotics                                          strongly encouraged.      Escalation of antibiotics        strongly encouraged.   Heparin level (unfractionated)     Status: None   Collection Time: 11/15/15  3:46 AM  Result Value Ref Range   Heparin Unfractionated 0.55 0.30 - 0.70 IU/mL    Comment:        IF HEPARIN RESULTS ARE BELOW EXPECTED VALUES, AND PATIENT DOSAGE HAS BEEN CONFIRMED, SUGGEST FOLLOW UP TESTING OF ANTITHROMBIN III LEVELS.   CBC     Status: Abnormal   Collection Time: 11/15/15  3:46 AM  Result Value Ref Range   WBC 18.8 (H) 4.0 - 10.5 K/uL   RBC 3.91 3.87 - 5.11 MIL/uL   Hemoglobin 11.7 (L) 12.0 - 15.0 g/dL   HCT 36.1 36.0 - 46.0 %   MCV 92.3 78.0 - 100.0 fL   MCH 29.9 26.0 - 34.0 pg   MCHC 32.4 30.0 - 36.0 g/dL  RDW 14.7 11.5 - 15.5 %    Platelets 223 150 - 400 K/uL  Basic metabolic panel     Status: Abnormal   Collection Time: 11/15/15  3:46 AM  Result Value Ref Range   Sodium 136 135 - 145 mmol/L   Potassium 3.9 3.5 - 5.1 mmol/L   Chloride 97 (L) 101 - 111 mmol/L   CO2 21 (L) 22 - 32 mmol/L   Glucose, Bld 103 (H) 65 - 99 mg/dL   BUN 65 (H) 6 - 20 mg/dL   Creatinine, Ser 2.73 (H) 0.44 - 1.00 mg/dL   Calcium 8.0 (L) 8.9 - 10.3 mg/dL   GFR calc non Af Amer 15 (L) >60 mL/min   GFR calc Af Amer 18 (L) >60 mL/min    Comment: (NOTE) The eGFR has been calculated using the CKD EPI equation. This calculation has not been validated in all clinical situations. eGFR's persistently <60 mL/min signify possible Chronic Kidney Disease.    Anion gap 18 (H) 5 - 15     ROS:  A comprehensive review of systems was negative except for: Constitutional: positive for fatigue Respiratory: positive for cough and dyspnea on exertion Gastrointestinal: positive for abdominal pain  Physical Exam: Filed Vitals:   11/15/15 0800 11/15/15 1150  BP: 114/68 83/60  Pulse: 66 68  Temp: 97.8 F (36.6 C) 97.3 F (36.3 C)  Resp: 20 19     General -- NAD Chest -- good expansion. Lungs clear to auscultation. Cardiac -- RRR.  Abdomen -- soft, obese, RUQ tenderness, slightly distended. Extremeties - no tenderness or effusions noted. +2 pitting edema to the tibial tuberosity bilaterally. ROM good. Dorsalis pedis pulses present and symmetrical.    Assessment/Plan: 1. AoCKD: suspect cardiorenal syndrome vs. OP ARB use. FENa <0.1%, BUN/Cr >20:1, urine osmolality 501 making prerenal injury more likely than ATN. However, granular cast noted on UA. Cr stable at 2.73 today. UOP 0.75L in 24hr. BP  80s/60s. On Metoprolol -- consider holding if BP continues to drop. May need Midodrine if BP gets too low. Hold Lasix. Fluid restrict. Avoid nephrotixic meds (ACE/ARB/NSAID/Contrast) 2. Hyponatremia: Improving. Will monitor. 3. Dyspnea: CXR w/ midlung opacity.  Was on vanc/zosyn. DCd by primary team. Procalcitonin was >0.5. May consider restarting if patient status worsens. 4. AoCHF: Echo EF45-50%. Suspect major player in renal dysfunction. Manage per cards 5. Ascites: abdominal pain present (RUQ). US abdomen w/ moderate fluid and benign appearing liver cysts x2. Would hold on MRI contrast unless absolutely necessary. May need paracentesis if resp distress or abdominal pain worsens. Consider Lipase but pancreatitis is unlikely.  Manage per primary.    Georges Lynch 11/15/2015, 1:19 PM

## 2015-11-15 NOTE — Progress Notes (Signed)
PULMONARY / CRITICAL CARE MEDICINE   Name: Kendra Martinez MRN: [id number] DOB: 01-Dec-1933    ADMISSION DATE:  11/12/2015 CONSULTATION DATE: 4/18  REFERRING MD:  [facility]  CHIEF COMPLAINT: SOB  HISTORY OF PRESENT ILLNESS:   82 yo wf, never smoker, just discharged from [facility] 4/8 after being treated for CHF, KlebP pna, Klep P uti. She comes to [facility] (moved to [place]) with CC of SOB, no fever, chills or sweats, + white "slimey sputum"and abd pain. Evaluation reveals rt pleural effusion(no x rays to compare) ELEVATED WBC, new Afib. She was started on heparin and PCCM asked to evaluate rt effusion. No urgent need for thoracentesis in setting of CHF.  SUBJECTIVE:  Maybe a little better   VITAL SIGNS: BP 114/68 mmHg  Pulse 66  Temp(Src) 97.8 F (36.6 C) (Oral)  Resp 20  Ht 5\' 3"  (1.6 m)  Wt 268 lb (121.564 kg)  BMI 47.49 kg/m2  SpO2 92%   INTAKE / OUTPUT:  Intake/Output Summary (Last 24 hours) at 11/15/15 0918 Last data filed at 11/15/15 0700  Gross per 24 hour  Intake 1144.57 ml  Output    750 ml  Net 394.57 ml    PHYSICAL EXAMINATION: General: MOWF NAD at rest, still not feeling well Neuro:  Intact, MAE x 4, lip speaks well HEENT:  No JVD, MMM Cardiovascular:  HSIR IR a fib  Lungs:  Decreased bs bases, no increased wob at rest , DOE reported Abdomen: obese,non tender + bs Musculoskeletal:  intact Skin:  Lower ext ++ edema  LABS:  BMET  Recent Labs Lab 11/13/15 0440 11/14/15 0622 11/15/15 0346  NA 132* 133* 136  K 4.1 4.4 3.9  CL 96* 94* 97*  CO2 19* 21* 21*  BUN 52* 63* 65*  CREATININE 2.47* 2.74* 2.73*  GLUCOSE 116* 109* 103*    Electrolytes  Recent Labs Lab 11/13/15 0440 11/14/15 0622 11/15/15 0346  CALCIUM 8.0* 8.1* 8.0*  MG 1.6*  --   --   PHOS 3.9  --   --     CBC  Recent Labs Lab 11/13/15 0440 11/14/15 0622 11/15/15 0346  WBC 18.9* 18.5* 18.8*  HGB 12.5 12.1 11.7*  HCT 37.9 37.1 36.1  PLT 217 211 223    Coag's  Recent Labs Lab  11/13/15 0440  APTT 122*  INR 1.59*    Sepsis Markers  Recent Labs Lab 11/12/15 2340 11/13/15 0440 11/13/15 0734 11/15/15 0027  LATICACIDVEN 1.2 1.5 1.2  --   PROCALCITON  --  0.65  --  0.71    ABG  Recent Labs Lab 11/12/15 1549  PHART 7.412  PCO2ART 36.0  PO2ART 69.0*    Liver Enzymes  Recent Labs Lab 11/12/15 2330 11/13/15 0440  AST 21 25  ALT 15 18  ALKPHOS 55 59  BILITOT 1.1 1.4*  ALBUMIN 2.2* 2.3*    Cardiac Enzymes  Recent Labs Lab 11/12/15 2330 11/13/15 0440 11/13/15 1120  TROPONINI 0.11* 0.08* 0.07*    Glucose No results for input(s): GLUCAP in the last 168 hours.  Imaging US Abdomen Complete  11/14/2015  CLINICAL DATA:  Ascites. Evaluate liver. Acute renal failure. History of hypertension and congestive heart failure. History of a cholecystectomy. EXAM: ABDOMEN ULTRASOUND COMPLETE COMPARISON:  11/13/2015 FINDINGS: Gallbladder: Surgically absent. Common bile duct: Diameter: 6 mm Liver: 2 large cystic areas, 1 projecting along the anterior segment right lobe/medial segmental left lobe measuring 8.9 x 7.8 x 10.3 cm. The other arising from the posterior aspect of the right  lobe measuring 6.6 x 6.1 x 7.3 cm. Liver demonstrates a coarsened echotexture with a nodular surface contour. Liver normal in overall size. IVC: Limited visualization.  No gross abnormality. Pancreas: Limited visualization.  No gross abnormality. Spleen: Size and appearance within normal limits. Right Kidney: Length: 10.6 cm. Mild renal cortical thinning. Echogenicity within normal limits. No mass or hydronephrosis visualized. Left Kidney: Length: 10.9 cm. Mild renal cortical thinning. Echogenicity within normal limits. No mass or hydronephrosis visualized. Abdominal aorta: Not well seen from the midportion to the bifurcation. Gross aneurysm. Other findings: Moderate amount of ascites similar to the previous day's study. Right pleural effusion. IMPRESSION: 1. Appearance of the liver is  consistent with cirrhosis. There are 2 large with her cystic masses, likely simple hepatic cysts. Consider follow-up liver MRI with and without contrast for further assessment and lesion characterization. 2. No acute findings. 3. Moderate ascites. Electronically Signed   By: Lajean Manes M.D.   On: 11/14/2015 15:43     STUDIES:   CULTURES: 4/18 bc>> neg 4/18 uc>> 60k orgs  ANTIBIOTICS: 4/18 diflucan>> 4/18 vanc>> 4/18 Zoysn>>   ASSESSMENT / PLAN:  Recent dx of asthma Recent dx 4/8 of Kleb P Pna Rt pleural effusion known from previous admit at [facility]  ->this is likely a transudate; but given recent PNA would be nice to prove.  Plan:   Wean O2 Will d/w Dr Elsworth Soho. ? Hold lasix and go ahead w/ diagnostic thora. Understanding that it would be of more diagnostic than therapeutic benefit   Acute on chronic diastolic HF w/ hypervolemia  New onset a fib Plan:  Cont diuresis as BP/BUN/creatinine allow Cont heparin; agree would hold on coumadin until procedures completed  Rate control w/ lopressor   AKI vs CRI-->her creatinine was elevated on her admit at the last hospital at >2.5. Wonder if her renal dysfxn really may be the driving issue here, which would certainly explain her anasarca & total body Volume overload Scr remains elevated but has hit a plateau. Renal now following.  Plan:   Follow creatine  Careful with diuresis  Renal dose meds  Consider dc vanc   Anasarca, Ascites & effusion (as above) Plan Considering paracentesis at some point assuming renal fxn stabilizes, again; doubt there will be much in the way of symptomatic improvement w/ this  FTT and severe physical deconditioning.  Has had remarkable decline slowly over the last 4 months. Has gone from ambulatory w/ cane to essentially bed/chair bound since Christmas time.  Plan Will need extensive PT and dietary support.   Dx with recent [place], Treated with levaquin. On V/Z per  IM WBC 18.9,  procal 0.65 Plan:   V/z per IM team Agree w/ Nephro-->think we should dc vanc      DISCUSSION: MOWF admitted w/ dyspnea in setting of decompensated d heart failure; and probably progressive cardio-renal syndrome resulting in diffuse anasarca and volume overload. She has had progressive FTT since Dec 2016. She has had the right effusion for some time. From a pulmonary stand-point it is unlikely to be a source of significant symptom burden in light of all of her other issues, however she did cary a dx of recent PNA. May be reasonable to consider diagnostic right thora. Although suspect it will be transudate; in which case attention to  her cardio-renal fxn would be primary focus.   Erick Colace ACNP-BC [place] Pager # [phone] OR # [phone] if no answer

## 2015-11-15 NOTE — Clinical Social Work Note (Signed)
Clinical Social Work Assessment  Patient Details  Name: Kendra Martinez MRN: [id number] Date of Birth: 16-Jul-1934  Date of referral:  11/15/15               Reason for consult:  Facility Placement                Permission sought to share information with:  Facility Sport and exercise psychologist, Family Supports Permission granted to share information::  Yes, Verbal Permission Granted  Name::     Kendra Martinez::  [facility] SNFs  Relationship::  Spouse  Contact Information:  [phone]  Housing/Transportation Living arrangements for the past 2 months:  [place] of Information:  Patient, Adult Children, Spouse (Patient only able to whisper) Patient Interpreter Needed:  None Criminal Activity/Legal Involvement Pertinent to Current Situation/Hospitalization:  No - Comment as needed Significant Relationships:  Adult Children, Spouse Lives with:  Spouse Do you feel safe going back to the place where you live?  No Need for family participation in patient care:  Yes (Comment)  Care giving concerns:  CSW received referral for possible SNF placement at time of discharge. CSW met with patient, patient's husband, and patient's son at bedside regarding PT recommendation of SNF placement at time of discharge. Per patient's husband, he is currently unable to care for patient at their home given patient's current physical needs and fall risk. Patient was only able to whisper and then fell asleep for majority of conversation. Patient's son and patient's husband expressed understanding of PT recommendation and are agreeable to SNF placement at time of discharge. CSW to continue to follow and assist with discharge planning needs.   Social Worker assessment / plan:  CSW spoke with patient and patient's husband concerning possibility of rehab at [facility] before returning home.  Employment status:  Retired Forensic scientist:  Medicare PT Recommendations:  [place] / Referral to community resources:  [place]  Patient/Family's Response to care:  Patient and patient's husband recognize need for rehab before returning home and are agreeable to a SNF in [place] or [place]. Patient has never been in a facility before and patient's husband only knows of a few facilities in [place], though he is hopeful rehab will help patient get better.  Patient/Family's Understanding of and Emotional Response to Diagnosis, Current Treatment, and Prognosis: Patient/family are realistic regarding therapy needs. No questions/concerns about plan or treatment.    Emotional Assessment Appearance:  Appears stated age Attitude/Demeanor/Rapport:   (Appropriate) Affect (typically observed):  Appropriate, Other, Quiet (Patient only able to whisper, then fell asleep) Orientation:  Oriented to Self, Oriented to Place Alcohol / Substance use:  Not Applicable Psych involvement (Current and /or in the community):  No (Comment)  Discharge Needs  Concerns to be addressed:  Care Coordination Readmission within the last 30 days:  No Current discharge risk:  None Barriers to Discharge:  Continued Medical Work up   Kendra Martinez, [place] 11/15/2015, 4:48 PM

## 2015-11-15 NOTE — Plan of Care (Signed)
Problem: Cardiac: Goal: Ability to achieve and maintain adequate cardiopulmonary perfusion will improve Outcome: Progressing Monitor VSS and Fluid volume status

## 2015-11-15 NOTE — Evaluation (Signed)
Occupational Therapy Evaluation Patient Details Name: Kendra Martinez MRN: [id number] DOB: Dec 07, 1933 Today's Date: 11/15/2015    History of Present Illness This 82 y.o. female admitted wit SOB and generalized weakness.   Dx; Rt pleural effusion of unclear etiology, recently diagnosed Lt heart failure and HTN, as well as sepsis.   PMH includes: Bipolar disorder   Clinical Impression   Pt admitted with above. She demonstrates the below listed deficits and will benefit from continued OT to maximize safety and independence with BADLs.  Pt is severely deconditioned.  She is only able to sit EOB for ~4 mins with min A before fatigue.  C/o dizziness while EOB.  BP 80/60s.  She currently requires total A for ADLs.       Follow Up Recommendations  SNF;Supervision/Assistance - 24 hour    Equipment Recommendations       Recommendations for Other Services       Precautions / Restrictions Precautions Precautions: Fall      Mobility Bed Mobility Overal bed mobility: +2 for physical assistance;Needs Assistance Bed Mobility: Supine to Sit;Sit to Supine     Supine to sit: +2 for physical assistance;Mod assist Sit to supine: +2 for physical assistance;Max assist   General bed mobility comments: Pt assisted with moving LEs off bed and with lifting shoulders   Transfers                 General transfer comment: Requires min A to maintain EOB sitting     Balance Overall balance assessment: Needs assistance Sitting-balance support: Feet supported Sitting balance-Leahy Scale: Poor Sitting balance - Comments: required min A t Postural control: Posterior lean                                  ADL Overall ADL's : Needs assistance/impaired Eating/Feeding: Independent;Bed level   Grooming: Wash/dry hands;Wash/dry face;Oral care;Brushing hair;Set up;Bed level   Upper Body Bathing: Maximal assistance;Bed level   Lower Body Bathing: Total assistance;Bed level   Upper  Body Dressing : Maximal assistance;Bed level   Lower Body Dressing: Total assistance;Bed level   Toilet Transfer: Total assistance Toilet Transfer Details (indicate cue type and reason): Pt unable to attempt  Toileting- Clothing Manipulation and Hygiene: Total assistance;Bed level       Functional mobility during ADLs: Total assistance General ADL Comments: Pt fatigues quickly with activity      Vision     Perception     Praxis      Pertinent Vitals/Pain Pain Assessment: No/denies pain     Hand Dominance Right   Extremity/Trunk Assessment Upper Extremity Assessment Upper Extremity Assessment: Generalized weakness   Lower Extremity Assessment Lower Extremity Assessment: Defer to PT evaluation   Cervical / Trunk Assessment Cervical / Trunk Assessment: Other exceptions Cervical / Trunk Exceptions: weakness   Communication Communication Communication: Expressive difficulties (low volume - difficult to understand )   Cognition Arousal/Alertness: Awake/alert Behavior During Therapy: WFL for tasks assessed/performed Overall Cognitive Status: Within Functional Limits for tasks assessed                     General Comments       Exercises       Shoulder Instructions      Home Living Family/patient expects to be discharged to:: Private residence Living Arrangements: Spouse/significant other Available Help at Discharge: Family;Available 24 hours/day Type of Home: House Home Access: Ramped entrance  Home Layout: One level     Bathroom Shower/Tub: Occupational psychologist: Standard     Home Equipment: Environmental consultant - 2 wheels;Shower seat;Wheelchair - manual   Additional Comments: 2 L O2 [place] 24/7 at home.       Prior Functioning/Environment Level of Independence: Needs assistance;Independent with assistive device(s)  Gait / Transfers Assistance Needed: for the past week she has been unable to walk, before that she used a RW for household  distances.   ADL's / Homemaking Assistance Needed: Pt has required assist from spouse         OT Diagnosis: Generalized weakness   OT Problem List: Decreased strength;Decreased activity tolerance;Impaired balance (sitting and/or standing);Decreased coordination;Decreased cognition;Decreased knowledge of use of DME or AE;Obesity;Impaired UE functional use   OT Treatment/Interventions: Self-care/ADL training;Therapeutic exercise;Energy conservation;DME and/or AE instruction;Therapeutic activities;Patient/family education;Balance training    OT Goals(Current goals can be found in the care plan section) Acute Rehab OT Goals Patient Stated Goal: to get stronger  OT Goal Formulation: With patient/family Time For Goal Achievement: 11/29/15 Potential to Achieve Goals: Fair ADL Goals Pt Will Perform Upper Body Bathing: with min assist;sitting Pt Will Transfer to Toilet: with mod assist;stand pivot transfer;bedside commode Additional ADL Goal #1: Pt will actively participate in ADL activities for 20 mins with 3 rest breaks  Additional ADL Goal #2: Pt will be HEP with min A   OT Frequency: Min 2X/week   Barriers to D/C: Decreased caregiver support  doubt spouse can provide necessary of care that pt requires        Co-evaluation PT/OT/SLP Co-Evaluation/Treatment: Yes Reason for Co-Treatment: For patient/therapist safety   OT goals addressed during session: ADL's and self-care;Strengthening/ROM      End of Session Equipment Utilized During Treatment: Oxygen Nurse Communication: Mobility status  Activity Tolerance: Patient limited by fatigue;Other (comment) (dizziness ) Patient left: in bed;with call bell/phone within reach;with bed alarm set;with family/visitor present   Time: [id number] OT Time Calculation (min): 18 min Charges:  OT General Charges $OT Visit: 1 Procedure OT Evaluation $OT Eval Moderate Complexity: 1 Procedure G-Codes:    Brylan Dec, Ellard Artis M 11/20/2015, 4:11  PM

## 2015-11-15 NOTE — Care Management Important Message (Signed)
Important Message  Patient Details  Name: Kendra Martinez MRN: [id number] Date of Birth: January 31, 1934   Medicare Important Message Given:  Yes    [name] 11/15/2015, 4:16 PM

## 2015-11-15 NOTE — Plan of Care (Signed)
Problem: Education: Goal: Knowledge of [facility] Education information/materials will improve Outcome: Progressing Provide Education information as appropriate  Problem: Phase I Progression Outcomes Goal: Dyspnea controlled at rest Outcome: Progressing Denies SOB at Rest. O2 @ 2 L via [place]. Goal: Hemodynamically stable Outcome: Progressing SBP in 80's today. Parameters added to bp meds.

## 2015-11-15 NOTE — Clinical Social Work Placement (Signed)
   CLINICAL SOCIAL WORK PLACEMENT  NOTE  Date:  11/15/2015  Patient Details  Name: Kendra Martinez MRN: [id number] Date of Birth: [birth year]  Clinical Social Work is seeking post-discharge placement for this patient at the [place] level of care (*CSW will initial, date and re-position this form in  chart as items are completed):      Patient/family provided with [place] Work Department's list of facilities offering this level of care within the geographic area requested by the patient (or if unable, by the patient's family).      Patient/family informed of their freedom to choose among providers that offer the needed level of care, that participate in Medicare, Medicaid or managed care program needed by the patient, have an available bed and are willing to accept the patient.      Patient/family informed of [facility]'s ownership interest in [facility] and [facility], as well as of the fact that they are under no obligation to receive care at these facilities.  PASRR submitted to EDS on 11/15/15     PASRR number received on 11/15/15     Existing PASRR number confirmed on       FL2 transmitted to all facilities in geographic area requested by pt/family on 11/15/15     FL2 transmitted to all facilities within larger geographic area on       Patient informed that his/her managed care company has contracts with or will negotiate with certain facilities, including the following:            Patient/family informed of bed offers received.  Patient chooses bed at       Physician recommends and patient chooses bed at      Patient to be transferred to   on  .  Patient to be transferred to facility by       Patient family notified on   of transfer.  Name of family member notified:        PHYSICIAN Please sign FL2     Additional Comment:    _______________________________________________ Benard Halsted, [place] 11/15/2015, 5:02 PM

## 2015-11-15 NOTE — Progress Notes (Signed)
ANTICOAGULATION & ANTIBIOTIC CONSULT NOTE - Follow Up Consult  Pharmacy Consult:  Heparin + Vancomycin/Zosyn  Indication: atrial fibrillation + Sepsis  Allergies  Allergen Reactions  . Sulfa Antibiotics Hives    Patient Measurements: Height: 5\' 3"  (160 cm) Weight: 268 lb (121.564 kg) IBW/kg (Calculated) : 52.4 Heparin Dosing Weight: 82 kg  Vital Signs: Temp: 98.2 F (36.8 C) (04/20 0400) Temp Source: Axillary (04/20 0400) BP: 101/36 mmHg (04/20 0400) Pulse Rate: 87 (04/20 0400)  Labs:  Recent Labs  11/12/15 2330 11/13/15 0440  11/13/15 1120 11/13/15 1821 11/14/15 0622 11/15/15 0346  HGB  --  12.5  --   --   --  12.1 11.7*  HCT  --  37.9  --   --   --  37.1 36.1  PLT  --  217  --   --   --  211 223  APTT  --  122*  --   --   --   --   --   LABPROT  --  19.0*  --   --   --   --   --   INR  --  1.59*  --   --   --   --   --   HEPARINUNFRC  --   --   < > 0.48 0.58 0.66 0.55  CREATININE 2.37* 2.47*  --   --   --  2.74* 2.73*  TROPONINI 0.11* 0.08*  --  0.07*  --   --   --   < > = values in this interval not displayed.  Estimated Creatinine Clearance: 21.1 mL/min (by C-G formula based on Cr of 2.73).    Assessment: 23 YOF with new-onset AFib to continue on IV heparin.  Heparin level therapeutic; no bleeding reported.   Pharmacy also managing vancomycin and Zosyn for sepsis.  Patient's renal function has been declining.   Vanc 4/17>> Zosyn 4/17>> Fluc 4/18>>  4/17 BCx x2 - NGTD 4/18 UCx - 60K c/mL yeast    Goal of Therapy:  Heparin level 0.3-0.7 units/ml Monitor platelets by anticoagulation protocol: Yes  Vanc trough 15-20 mcg/mL    Plan:  - Continue heparin gtt at 1050 units/hr - Daily HL / CBC - Vanc 1500mg  IV Q48H - Zosyn 3.375gm IV Q8H, 4 hr infusion - Monitor renal fxn, clinical progress, vanc trough at Css - Does patient still need diflucan?    Zuleika Gallus D. Mina Marble, PharmD, BCPS Pager:  [phone] 11/15/2015, 8:32 AM

## 2015-11-16 ENCOUNTER — Ambulatory Visit: Payer: Medicare Other | Admitting: Family Medicine

## 2015-11-16 ENCOUNTER — Inpatient Hospital Stay (HOSPITAL_COMMUNITY): Payer: Medicare Other

## 2015-11-16 DIAGNOSIS — I959 Hypotension, unspecified: Secondary | ICD-10-CM | POA: Insufficient documentation

## 2015-11-16 LAB — LACTATE DEHYDROGENASE, PLEURAL OR PERITONEAL FLUID: LD FL: 139 U/L — AB (ref 3–23)

## 2015-11-16 LAB — BODY FLUID CELL COUNT WITH DIFFERENTIAL
EOS FL: 0 %
Lymphs, Fluid: 10 %
MONOCYTE-MACROPHAGE-SEROUS FLUID: 34 % — AB (ref 50–90)
NEUTROPHIL FLUID: 56 % — AB (ref 0–25)
WBC FLUID: 1592 uL — AB (ref 0–1000)

## 2015-11-16 LAB — LACTATE DEHYDROGENASE: LDH: 198 U/L — ABNORMAL HIGH (ref 98–192)

## 2015-11-16 LAB — COMPREHENSIVE METABOLIC PANEL
ALK PHOS: 56 U/L (ref 38–126)
ALT: 15 U/L (ref 14–54)
ANION GAP: 16 — AB (ref 5–15)
AST: 21 U/L (ref 15–41)
Albumin: 2 g/dL — ABNORMAL LOW (ref 3.5–5.0)
BILIRUBIN TOTAL: 1 mg/dL (ref 0.3–1.2)
BUN: 70 mg/dL — ABNORMAL HIGH (ref 6–20)
CALCIUM: 8.2 mg/dL — AB (ref 8.9–10.3)
CO2: 22 mmol/L (ref 22–32)
Chloride: 99 mmol/L — ABNORMAL LOW (ref 101–111)
Creatinine, Ser: 2.87 mg/dL — ABNORMAL HIGH (ref 0.44–1.00)
GFR, EST AFRICAN AMERICAN: 17 mL/min — AB (ref >60)
GFR, EST NON AFRICAN AMERICAN: 15 mL/min — AB (ref >60)
Glucose, Bld: 103 mg/dL — ABNORMAL HIGH (ref 65–99)
POTASSIUM: 4.1 mmol/L (ref 3.5–5.1)
Sodium: 137 mmol/L (ref 135–145)
TOTAL PROTEIN: 5.2 g/dL — AB (ref 6.5–8.1)

## 2015-11-16 LAB — GRAM STAIN

## 2015-11-16 LAB — HEPATITIS PANEL, ACUTE
HEP A IGM: NEGATIVE
HEP B S AG: NEGATIVE
Hep B C IgM: NEGATIVE

## 2015-11-16 LAB — PROTIME-INR
INR: 1.22 (ref 0.00–1.49)
PROTHROMBIN TIME: 15.5 s — AB (ref 11.6–15.2)

## 2015-11-16 LAB — CBC
HEMATOCRIT: 36.8 % (ref 36.0–46.0)
Hemoglobin: 12 g/dL (ref 12.0–15.0)
MCH: 30.2 pg (ref 26.0–34.0)
MCHC: 32.6 g/dL (ref 30.0–36.0)
MCV: 92.5 fL (ref 78.0–100.0)
Platelets: 229 10*3/uL (ref 150–400)
RBC: 3.98 MIL/uL (ref 3.87–5.11)
RDW: 14.7 % (ref 11.5–15.5)
WBC: 15.6 10*3/uL — ABNORMAL HIGH (ref 4.0–10.5)

## 2015-11-16 LAB — APTT: APTT: 31 s (ref 24–37)

## 2015-11-16 LAB — PROTEIN, TOTAL: Total Protein: 5.3 g/dL — ABNORMAL LOW (ref 6.5–8.1)

## 2015-11-16 LAB — PROTEIN, BODY FLUID: Total protein, fluid: <3 g/dL

## 2015-11-16 MED ORDER — ONDANSETRON HCL 4 MG/2ML IJ SOLN
4.0000 mg | INTRAMUSCULAR | Status: DC | PRN
  Administered 2015-11-16 – 2015-11-17 (×4): 4 mg via INTRAVENOUS
  Filled 2015-11-16 (×4): qty 2

## 2015-11-16 MED ORDER — ONDANSETRON HCL 4 MG PO TABS: 4.0000 mg | ORAL_TABLET | ORAL | Status: DC | PRN

## 2015-11-16 MED ORDER — HEPARIN (PORCINE) IN NACL 100-0.45 UNIT/ML-% IJ SOLN
1050.0000 [IU]/h | INTRAMUSCULAR | Status: DC
  Administered 2015-11-16 – 2015-11-22 (×6): 1050 [IU]/h via INTRAVENOUS
  Filled 2015-11-16 (×5): qty 250

## 2015-11-16 NOTE — Clinical Documentation Improvement (Addendum)
Hospitalists  Please document query responses in the progress notes and discharge summary, not on the CDI BPA form.  Thank you.  To assist with accurate code assignment, please clarify and document if the diagnosis of "Sepsis" has been:   - Confirmed, including the suspected or known source of infection or unable to determine  - Ruled Out  - Still possible, including the suspected or known source of infection or unable to determine  - Unable to clinically determine    Please exercise your independent, professional judgment when responding. A specific answer is not anticipated or expected.   Thank You, Erling Conte  RN BSN [place] [phone]

## 2015-11-16 NOTE — Care Management Note (Addendum)
Case Management Note  Patient Details  Name: Kendra Martinez MRN: [id number] Date of Birth: [birth year]  Subjective/Objective:       Pt admitted for Acute on Chronic CHF. Pt is from home. Pt will benefit from Palliative Care Consult. PT recommendations for SNF.          Action/Plan: CM will continue to monitor for disposition needs.    Expected Discharge Date:                  Expected Discharge Plan:  Skilled Nursing Facility  In-House Referral:  Clinical Social Work  Discharge planning Services  CM Consult  Post Acute Care Choice:  NA Choice offered to:  NA  DME Arranged:   N/A DME Agency:   N/A  HH Arranged:   N/A HH Agency:   N/A  Status of Service:  Completed Medicare Important Message Given:  Yes Date Medicare IM Given:    Medicare IM give by:    Date Additional Medicare IM Given:    Additional Medicare Important Message give by:     If discussed at [place] of Stay Meetings, dates discussed:    Additional Comments:[id number] 11-23-15 Kendra Krauss, RN,BSN [phone] Pt/ Family agreeable to [facility]. CSW assisting with disposition needs. No further needs from CM at this time.   Kendra Roys, RN 11/16/2015, 3:36 PM

## 2015-11-16 NOTE — Progress Notes (Signed)
[facility] PROGRESS NOTE  Kendra Martinez [id number] DOB: 12/02/1933 DOA: 11/12/2015  PCP: She has recently relocated to this area. Does not have any providers here.  Brief HPI: 82 year old Caucasian female with a past medical history of hypertension, diastolic CHF, recently treated pneumonia at an outside facility, presented with was needing shortness of breath. Patient is new to [place]. She has recently relocated to this area. Does not see any providers here.  Past medical history:  Past Medical History  Diagnosis Date  . CHF (congestive heart failure) ([place])   . Hypertension   . Pleural effusion 10/2015  . Bipolar disorder ([place])   . GERD (gastroesophageal reflux disease)   . Ascites   . Arthritis     oa in knees    Consultants: Cardiology. Pulmonology. Nephrology. Gastroenterology  Procedures:  Transthoracic echocardiogram Study Conclusions - Left ventricle: The cavity size was normal. There was mild focal basal hypertrophy of the septum. Systolic function was mildly reduced. The estimated ejection fraction was in the range of 45% to 50%. Wall motion was normal; there were no regional wall motion abnormalities. - Aortic valve: Trileaflet; mildly thickened, mildly calcified leaflets. - Mitral valve: Calcified annulus. There was trivial regurgitation.   Antibiotics: Vancomycin and Zosyn 4/17--4/20  Subjective: Patient continues to feel weak and fatigued. Denies any chest pain. Breathing is about the same. No cough. No abdominal pain. No nausea, vomiting. Husband is at the bedside.   Objective:  Vital Signs  Filed Vitals:   11/15/15 2119 11/16/15 0000 11/16/15 0430 11/16/15 0755  BP:  101/67 96/67 112/78  Pulse:  79 80 84  Temp: 99 F (37.2 C) 98.8 F (37.1 C) 97.6 F (36.4 C) 97.9 F (36.6 C)  TempSrc: Axillary Axillary Axillary Oral  Resp:  18 17 19   Height:      Weight:   122.834 kg (270 lb 12.8 oz)   SpO2:  100% 100% 98%    Intake/Output  Summary (Last 24 hours) at 11/16/15 0846 Last data filed at 11/16/15 0600  Gross per 24 hour  Intake 409.38 ml  Output    500 ml  Net -90.62 ml   Filed Weights   11/14/15 0451 11/15/15 0400 11/16/15 0430  Weight: 120.702 kg (266 lb 1.6 oz) 121.564 kg (268 lb) 122.834 kg (270 lb 12.8 oz)    General appearance: alert, cooperative, appears stated age and no distress. Fatigued. Resp: Diminished air entry in the right. Dullness to percussion. No definite crackles. Cardio: regular rate and rhythm, S1, S2 normal, no murmur, click, rub or gallop GI: soft, non-tender; bowel sounds normal; no masses,  no organomegaly Extremities: Bilateral pedal edema is present Neurologic: Alert and oriented X 3. No focal deficits.  Lab Results:  Data Reviewed: I have personally reviewed following labs and imaging studies  CBC:  Recent Labs Lab 11/12/15 1619 11/13/15 0440 11/14/15 0622 11/15/15 0346 11/16/15 0359  WBC 22.3* 18.9* 18.5* 18.8* 15.6*  NEUTROABS 19.9*  --   --   --   --   HGB 13.0 12.5 12.1 11.7* 12.0  HCT 39.2 37.9 37.1 36.1 36.8  MCV 92.7 92.7 93.0 92.3 92.5  PLT 219 217 211 223 [id number]   Basic Metabolic Panel:  Recent Labs Lab 11/12/15 2330 11/13/15 0440 11/14/15 0622 11/15/15 0346 11/16/15 0359  NA 133* 132* 133* 136 137  K 4.4 4.1 4.4 3.9 4.1  CL 97* 96* 94* 97* 99*  CO2 22 19* 21* 21* 22  GLUCOSE 97 116* 109* 103* 103*  BUN 52* 52* 63* 65* 70*  CREATININE 2.37* 2.47* 2.74* 2.73* 2.87*  CALCIUM 7.9* 8.0* 8.1* 8.0* 8.2*  MG  --  1.6*  --   --   --   PHOS  --  3.9  --   --   --    GFR: Estimated Creatinine Clearance: 20.2 mL/min (by C-G formula based on Cr of 2.87). Liver Function Tests:  Recent Labs Lab 11/12/15 2330 11/13/15 0440 11/16/15 0359  AST 21 25 21   ALT 15 18 15   ALKPHOS 55 59 56  BILITOT 1.1 1.4* 1.0  PROT 5.2* 5.3* 5.2*  ALBUMIN 2.2* 2.3* 2.0*   Coagulation Profile:  Recent Labs Lab 11/13/15 0440 11/16/15 0359  INR 1.59* 1.22   Cardiac  Enzymes:  Recent Labs Lab 11/12/15 2330 11/13/15 0440 11/13/15 1120  TROPONINI 0.11* 0.08* 0.07*   Thyroid Function Tests: No results for input(s): TSH, T4TOTAL, FREET4, T3FREE, THYROIDAB in the last 72 hours. Urine analysis:    Component Value Date/Time   COLORURINE YELLOW 11/13/2015 1132   APPEARANCEUR CLOUDY* 11/13/2015 1132   LABSPEC 1.023 11/13/2015 1132   PHURINE 5.5 11/13/2015 1132   GLUCOSEU NEGATIVE 11/13/2015 1132   HGBUR NEGATIVE 11/13/2015 1132   BILIRUBINUR MODERATE* 11/13/2015 1132   KETONESUR NEGATIVE 11/13/2015 1132   PROTEINUR 30* 11/13/2015 1132   NITRITE NEGATIVE 11/13/2015 1132   LEUKOCYTESUR MODERATE* 11/13/2015 1132    Recent Results (from the past 240 hour(s))  Blood culture (routine x 2)     Status: None (Preliminary result)   Collection Time: 11/12/15  8:20 PM  Result Value Ref Range Status   Specimen Description BLOOD RIGHT ANTECUBITAL  Final   Special Requests IN PEDIATRIC BOTTLE 3CC  Final   Culture NO GROWTH 3 DAYS  Final   Report Status PENDING  Incomplete  Blood culture (routine x 2)     Status: None (Preliminary result)   Collection Time: 11/12/15  8:25 PM  Result Value Ref Range Status   Specimen Description BLOOD RIGHT HAND  Final   Special Requests IN PEDIATRIC BOTTLE 3CC  Final   Culture NO GROWTH 3 DAYS  Final   Report Status PENDING  Incomplete  Urine culture     Status: Abnormal   Collection Time: 11/13/15 11:32 AM  Result Value Ref Range Status   Specimen Description URINE, CLEAN CATCH  Final   Special Requests NONE  Final   Culture 60,000 COLONIES/mL YEAST (A)  Final   Report Status 11/14/2015 FINAL  Final  MRSA PCR Screening     Status: None   Collection Time: 11/13/15  5:21 PM  Result Value Ref Range Status   MRSA by PCR NEGATIVE NEGATIVE Final    Comment:        The GeneXpert MRSA Assay (FDA approved for NASAL specimens only), is one component of a comprehensive MRSA colonization surveillance program. It is  not intended to diagnose MRSA infection nor to guide or monitor treatment for MRSA infections.       Radiology Studies: US Abdomen Complete  11/14/2015  CLINICAL DATA:  Ascites. Evaluate liver. Acute renal failure. History of hypertension and congestive heart failure. History of a cholecystectomy. EXAM: ABDOMEN ULTRASOUND COMPLETE COMPARISON:  11/13/2015 FINDINGS: Gallbladder: Surgically absent. Common bile duct: Diameter: 6 mm Liver: 2 large cystic areas, 1 projecting along the anterior segment right lobe/medial segmental left lobe measuring 8.9 x 7.8 x 10.3 cm. The other arising from the posterior aspect of the right lobe measuring 6.6 x 6.1 x  7.3 cm. Liver demonstrates a coarsened echotexture with a nodular surface contour. Liver normal in overall size. IVC: Limited visualization.  No gross abnormality. Pancreas: Limited visualization.  No gross abnormality. Spleen: Size and appearance within normal limits. Right Kidney: Length: 10.6 cm. Mild renal cortical thinning. Echogenicity within normal limits. No mass or hydronephrosis visualized. Left Kidney: Length: 10.9 cm. Mild renal cortical thinning. Echogenicity within normal limits. No mass or hydronephrosis visualized. Abdominal aorta: Not well seen from the midportion to the bifurcation. Gross aneurysm. Other findings: Moderate amount of ascites similar to the previous day's study. Right pleural effusion. IMPRESSION: 1. Appearance of the liver is consistent with cirrhosis. There are 2 large with her cystic masses, likely simple hepatic cysts. Consider follow-up liver MRI with and without contrast for further assessment and lesion characterization. 2. No acute findings. 3. Moderate ascites. Electronically Signed   By: Lajean Manes M.D.   On: 11/14/2015 15:43     Medications:  Scheduled: . feeding supplement  1 Container Oral BID BM  . fluconazole (DIFLUCAN) IV  100 mg Intravenous Q24H  . magic mouthwash  5 mL Oral TID  . metoprolol tartrate   12.5 mg Oral BID  . midodrine  5 mg Oral TID WC  . mometasone-formoterol  2 puff Inhalation BID  . ondansetron (ZOFRAN) IV  4 mg Intravenous Once  . pantoprazole  40 mg Oral Daily  . sertraline  25 mg Oral Daily  . sodium chloride flush  3 mL Intravenous Q12H  . sodium chloride flush  3 mL Intravenous Q12H   Continuous:   [id number] chloride, acetaminophen **OR** acetaminophen, ALPRAZolam, HYDROcodone-acetaminophen, ondansetron **OR** ondansetron (ZOFRAN) IV, sodium chloride flush, traMADol  Assessment/Plan:  Principal Problem:   Acute on chronic diastolic CHF (congestive heart failure), NYHA class 4 (HCC) Active Problems:   Pleural effusion   Essential hypertension   Hyponatremia   Sepsis ([place])   Anasarca   New onset a-fib (HCC)   Elevated troponin   Acute respiratory failure with hypoxemia (HCC)   Ascites   ARF (acute renal failure) (H[facility])   Cirrhosis of liver with ascites (HCC)    Right-sided pleural effusion Etiology is unclear but probably transudative secondary to liver cirrhosis or heart failure. Pulmonology is following. CT scan of the chest did not show any infiltrates. Echocardiogram only showed minimally depressed ejection fraction. PA pressure is noted to be normal. Might benefit from a diagnostic thoracentesis. Pulmonology to decide.  Anasarca with ascites, pleural effusion,  According to patient reports she could've gained approximately 40-50 pounds or even more over the last many months. Reason for her presentation is not entirely clear. Echocardiogram shows EF of 45-50%. PA pressures are normal. Ultrasound did show evidence for liver cirrhosis. She has hypoalbuminemia. Her PT/INR is slightly elevated. Patient denies any history of liver disease, hepatitis. This could be the reason for her presentation. Lasix was discontinued due to worsening creatinine. No abdominal tenderness on examination. Initially paracentesis was considered. However, due to renal failure, this  was deferred.   Liver cirrhosis of unclear etiology/hepatic cysts Etiology unclear but could be NASH. Gastroenterology is following. Hepatitis panel is pending. Ultrasound report reviewed. Liver cysts also noted. May need MRI at some point. She is hypoalbuminemic. PT/INR was elevated. INR normal this morning.   Hypotension Blood pressure was noted to be very low yesterday. Metoprolol dose was decreased with holding parameters. Patient was started on midodrine yesterday. Blood pressures improved this morning. Continue to monitor closely.  Possible acute on chronic kidney  disease Baseline renal function is not known. It was 2.5 when she was discharged from the outside facility. Creatinine has climbed. Some proteinuria noted on UA. Nephrology is following. Ultrasound of the abdomen was done does not show any hydronephrosis. Discussed with Dr. Justin Mend yesterday. He would like to continue to hold the diuretics for now. He anticipates that if and when her blood pressure improves her urine output will pick up. Continue to monitor for now.  New onset atrial fibrillation Cardiology is following. Echocardiogram is as above. TSH is 3.12. CHA2D vas2c score is 3. Patient started on intravenous heparin.  Recent Pneumonia Diagnosed at an outside facility. She has completed treatment. No clear evidence for infectious process at this time. She does have an elevated WBC, but she is afebrile. Cultures negative so far. Antibiotics were discontinued 4/20. WBC is improving.   Mildly elevated troponin Likely demand ischemia. Cardiology is following.  Essential hypertension See above.  DVT Prophylaxis: On IV heparin    Code Status: Full Code  Family Communication: Discussed with patient and her husband  Disposition Plan: Management as outlined above. Await further input from different specialties. Mobilize as tolerated.    LOS: 4 days   [facility] Pager [phone] 11/16/2015, 8:46  AM  If 7PM-7AM, please contact night-coverage at [email or web address], password [facility]

## 2015-11-16 NOTE — Progress Notes (Signed)
ANTICOAGULATION CONSULT NOTE - Follow Up Consult  Pharmacy Consult:  Heparin Indication: atrial fibrillation  Allergies  Allergen Reactions  . Sulfa Antibiotics Hives    Patient Measurements: Height: 5\' 3"  (160 cm) Weight: 270 lb 12.8 oz (122.834 kg) IBW/kg (Calculated) : 52.4 Heparin Dosing Weight: 82 kg  Vital Signs: Temp: 97.9 F (36.6 C) (04/21 0755) Temp Source: Oral (04/21 0755) BP: 112/78 mmHg (04/21 0755) Pulse Rate: 84 (04/21 0755)  Labs:  Recent Labs  11/13/15 1821  11/14/15 0622 11/15/15 0346 11/16/15 0359  HGB  --   < > 12.1 11.7* 12.0  HCT  --   --  37.1 36.1 36.8  PLT  --   --  211 223 229  APTT  --   --   --   --  31  LABPROT  --   --   --   --  15.5*  INR  --   --   --   --  1.22  HEPARINUNFRC 0.58  --  0.66 0.55  --   CREATININE  --   --  2.74* 2.73* 2.87*  < > = values in this interval not displayed.  Estimated Creatinine Clearance: 20.2 mL/min (by C-G formula based on Cr of 2.87).    Assessment: 63 YOF with new-onset AFib to resume IV heparin 6 hours post thoracentesis (completed around 1230).  Previously therapeutic on 1050 units/hr.  No bleeding reported.    Goal of Therapy:  Heparin level 0.3-0.7 units/ml Monitor platelets by anticoagulation protocol: Yes     Plan:  - At [id number], resume heparin gtt at 1050 units/hr - Check 8 hr HL - Daily HL / CBC - Does patient still need diflucan?    Khloi Rawl D. Mina Marble, PharmD, BCPS Pager:  [phone] 11/16/2015, 1:09 PM

## 2015-11-16 NOTE — Progress Notes (Signed)
PULMONARY / CRITICAL CARE MEDICINE   Name: Kendra Martinez MRN: [id number] DOB: August 14, 1933    ADMISSION DATE:  11/12/2015 CONSULTATION DATE: 4/18  REFERRING MD:  [facility]  CHIEF COMPLAINT: SOB  HISTORY OF PRESENT ILLNESS:   82 yo wf, never smoker, just discharged from [facility] 4/8 after being treated for CHF, KlebP pna, Klep P uti. She comes to [facility] (moved to [place] [facility]) with CC of SOB, no fever, chills or sweats, + white "slimey sputum"and abd pain. Evaluation reveals rt pleural effusion(no x rays to compare) ELEVATED WBC, new Afib. She was started on heparin and PCCM asked to evaluate rt effusion. No urgent need for thoracentesis in setting of CHF.  SUBJECTIVE:  Vomiting this am. Does not feel well   VITAL SIGNS: BP 112/78 mmHg  Pulse 84  Temp(Src) 97.9 F (36.6 C) (Oral)  Resp 19  Ht 5\' 3"  (1.6 m)  Wt 270 lb 12.8 oz (122.834 kg)  BMI 47.98 kg/m2  SpO2 98%   INTAKE / OUTPUT:  Intake/Output Summary (Last 24 hours) at 11/16/15 0938 Last data filed at 11/16/15 0600  Gross per 24 hour  Intake 409.38 ml  Output    500 ml  Net -90.62 ml    PHYSICAL EXAMINATION: General: MOWF NAD at rest Neuro:  Intact, MAE x 4, lip speaks well HEENT:  No JVD, MMM Cardiovascular:  HSIR IR a fib  Lungs:  Decreased bs bases, no increased wob at rest , DOE reported still Abdomen: obese,non tender + bs Musculoskeletal:  intact Skin:  Lower ext ++ edema  LABS:  BMET  Recent Labs Lab 11/14/15 0622 11/15/15 0346 11/16/15 0359  NA 133* 136 137  K 4.4 3.9 4.1  CL 94* 97* 99*  CO2 21* 21* 22  BUN 63* 65* 70*  CREATININE 2.74* 2.73* 2.87*  GLUCOSE 109* 103* 103*    Electrolytes  Recent Labs Lab 11/13/15 0440 11/14/15 0622 11/15/15 0346 11/16/15 0359  CALCIUM 8.0* 8.1* 8.0* 8.2*  MG 1.6*  --   --   --   PHOS 3.9  --   --   --     CBC  Recent Labs Lab 11/14/15 0622 11/15/15 0346 11/16/15 0359  WBC 18.5* 18.8* 15.6*  HGB 12.1 11.7* 12.0  HCT 37.1 36.1 36.8  PLT 211 223 229     Coag's  Recent Labs Lab 11/13/15 0440 11/16/15 0359  APTT 122* 31  INR 1.59* 1.22    Sepsis Markers  Recent Labs Lab 11/12/15 2340 11/13/15 0440 11/13/15 0734 11/15/15 0027  LATICACIDVEN 1.2 1.5 1.2  --   PROCALCITON  --  0.65  --  0.71    ABG  Recent Labs Lab 11/12/15 1549  PHART 7.412  PCO2ART 36.0  PO2ART 69.0*    Liver Enzymes  Recent Labs Lab 11/12/15 2330 11/13/15 0440 11/16/15 0359  AST 21 25 21   ALT 15 18 15   ALKPHOS 55 59 56  BILITOT 1.1 1.4* 1.0  ALBUMIN 2.2* 2.3* 2.0*    Cardiac Enzymes  Recent Labs Lab 11/12/15 2330 11/13/15 0440 11/13/15 1120  TROPONINI 0.11* 0.08* 0.07*    Glucose No results for input(s): GLUCAP in the last 168 hours.  Imaging No results found.   STUDIES:   CULTURES: 4/18 bc>> neg 4/18 uc>> 60k orgs  ANTIBIOTICS: 4/18 diflucan>> 4/20 4/18 vanc>> 4/20 4/18 Zoysn>> 4/20   ASSESSMENT / PLAN:  Recent dx of asthma Recent dx 4/8 of Kleb P Pna Rt pleural effusion known from previous admit at [facility]  ->  this is likely a transudate & could even reflect hydrothorax in the setting of ascites and newly diagnosed Cirrhosis.  But given recent PNA would be nice to prove.  Plan:   Wean O2 For diagnostic/therapeutic thora today  Will send complete analysis   Acute on chronic diastolic HF w/ hypervolemia  New onset a fib Plan:  Cont diuresis as BP/BUN/creatinine allow Cont heparin; agree would hold on coumadin until procedures completed  Rate control w/ lopressor   AKI vs CRI-->her creatinine was elevated on her admit at the last hospital at >2.5.  Anasarca Total body overload Scr remains elevated but has hit a plateau. Renal now following. In setting of newly diagnosed cirrhosis felt to NASH Renal thinks possibly hepato-renal syndrome.  Plan:   Follow creatine  Careful with diuresis  Renal dose meds   Newly diagnosed Cirrhosis. Felt most likely NASH w/ associated Anasarca, Ascites &  effusion (as above) Plan Considering paracentesis at some point assuming renal fxn stabilizes, again; doubt there will be much in the way of symptomatic improvement w/ this  FTT and severe physical deconditioning.  Has had remarkable decline slowly over the last 4 months. Has gone from ambulatory w/ cane to essentially bed/chair bound since Christmas time.  Plan Will need extensive PT and dietary support.     DISCUSSION: MOWF admitted w/ dyspnea in setting of decompensated d heart failure; and probably progressive hepato-renal syndrome resulting in diffuse anasarca and volume overload. She has had progressive FTT since Dec 2016. She has had the right effusion for some time, in setting of anasarca and ascites would think significance chance this could be hydrothorax. From a pulmonary stand-point it is unlikely to be a source of significant symptom burden in light of all of her other issues, however she did cary a dx of recent PNA. May be reasonable to consider diagnostic right thora. Although suspect it will be transudate; I agree w/ Nephrology that she needs a palliative care consult   Erick Colace ACNP-BC [place] Pager # [phone] OR # [phone] if no answer

## 2015-11-16 NOTE — Progress Notes (Signed)
     [facility] Progress Note  Subjective:  Nausea and vomiting this AM; just received IV zofran.  Going to have thoracentesis (diagnostic) today.  Objective:  Vital signs in last 24 hours: Temp:  [97.3 F (36.3 C)-99 F (37.2 C)] 97.9 F (36.6 C) (04/21 0755) Pulse Rate:  [68-84] 84 (04/21 0755) Resp:  [17-20] 19 (04/21 0755) BP: (79-112)/(36-78) 112/78 mmHg (04/21 0755) SpO2:  [97 %-100 %] 98 % (04/21 0755) Weight:  [270 lb 12.8 oz (122.834 kg)] 270 lb 12.8 oz (122.834 kg) (04/21 0430) Last BM Date: 11/14/15 General:  Alert, chronically ill-appearing, in NAD Heart:  Irregularly irregular. Pulm:  Decreased breath sounds on the right. Abdomen:  Soft, obese.  BS present.  Diffuse TTP. Extremities:  2+ pitting edema in B/L LE's Neurologic:  Alert and oriented x 4;  grossly normal neurologically.  Intake/Output from previous day: 04/20 0701 - 04/21 0700 In: 409.4 [P.O.:180; I.V.:229.4] Out: 500 [Urine:500]  Lab Results:  Recent Labs  11/14/15 0622 11/15/15 0346 11/16/15 0359  WBC 18.5* 18.8* 15.6*  HGB 12.1 11.7* 12.0  HCT 37.1 36.1 36.8  PLT 211 223 229   BMET  Recent Labs  11/14/15 0622 11/15/15 0346 11/16/15 0359  NA 133* 136 137  K 4.4 3.9 4.1  CL 94* 97* 99*  CO2 21* 21* 22  GLUCOSE 109* 103* 103*  BUN 63* 65* 70*  CREATININE 2.74* 2.73* 2.87*  CALCIUM 8.1* 8.0* 8.2*   LFT  Recent Labs  11/16/15 0359  PROT 5.2*  ALBUMIN 2.0*  AST 21  ALT 15  ALKPHOS 56  BILITOT 1.0   PT/INR  Recent Labs  11/16/15 0359  LABPROT 15.5*  INR 1.22   Hepatitis Panel  Recent Labs  11/15/15 1018  HEPBSAG Negative  HCVAB <0.1  HEPAIGM Negative  HEPBIGM Negative      Assessment / Plan: *Anasarca, ascites in setting of new dx cirrhosis and large liver cysts.  Platelets ok, + mild coaguopathy.  Suspect etiology is NASH, previously undiagnosed.  Not on diuretics due to AKI (and likely soft BP requiring midodrine as well).  ? Paracentesis  but would like renal recommendations regarding about of fluid to be removed and replacement of albumin (? Need for just diagnostic paracentesis vs therapeutic since she is getting diagnostic thoracentesis later today).    *AoCHF. Right pleural effusion.  Could be hepatic hydrothorax vs CHF (or both).  Appears that she is going to have a thoracentesis today.  *Severe protein cal malnutriton. Prealbumin 5.9  *AoCKD:  Renal suspects cardiorenal syndrome.  *Newly diagnosed atrial fibrillation:  On IV heparin and will likely need coumadin upon discharge.    LOS: 4 days   ZEHR, JESSICA D.  11/16/2015, 10:18 AM  Pager number [phone]     [facility] Attending   I have taken an interval history, reviewed the chart and examined the patient. I agree with the Advanced Practitioner's note, impression and recommendations.    Feels better after thoracentesis - Tx limited by kidney failure - fluid management deferred to renal  Will f/u Monday - call me on w/e if needed sooner   I opened up her [facility] info in [place] today - prior labs, echo, notes, etc  Gatha Mayer, MD, [facility] [phone] (pager) [phone] after 5 PM, weekends and holidays  11/16/2015 5:58 PM

## 2015-11-16 NOTE — Consult Note (Signed)
[facility] Renal Consultation Note  Requesting MD:  [facility] Indication for Consultation: Acute on Chronic Kidney Disease  HPI:  Kendra Martinez is a 82 y.o. female with a PMH significant for dCHF and recent admission for PNA (Klebsiella) at an outside hospital presenting with SOB and worsening bilateral edema over the past few weeks. Patient presented significantly volume overloaded and renal function has not tolerated diuretics well.  CREATININE, SER  Date/Time Value Ref Range Status  11/16/2015 03:59 AM 2.87* 0.44 - 1.00 mg/dL Final  11/15/2015 03:46 AM 2.73* 0.44 - 1.00 mg/dL Final  11/14/2015 06:22 AM 2.74* 0.44 - 1.00 mg/dL Final  11/13/2015 04:40 AM 2.47* 0.44 - 1.00 mg/dL Final  11/12/2015 11:30 PM 2.37* 0.44 - 1.00 mg/dL Final  11/12/2015 04:19 PM 2.56* 0.44 - 1.00 mg/dL Final     PMHx:   Past Medical History  Diagnosis Date  . CHF (congestive heart failure) ([place])   . Hypertension   . Pleural effusion 10/2015  . Bipolar disorder ([place])   . GERD (gastroesophageal reflux disease)   . Ascites   . 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 Cholecystectomy      Family Hx:  Family History  Problem Relation Age of Onset  . Kidney cancer Mother   . COPD Father   . Diabetes type II Other   . Stroke Neg Hx     Social History:  reports that she has never smoked. She has never used smokeless tobacco. She reports that she does not drink alcohol or use illicit drugs.  Allergies:  Allergies  Allergen Reactions  . Sulfa Antibiotics Hives    Medications: Prior to Admission medications   Medication Sig Start Date End Date Taking? Authorizing Provider  albuterol (PROVENTIL HFA;VENTOLIN HFA) 108 (90 Base) MCG/ACT inhaler Inhale 1-2 puffs into the lungs every 4 (four) hours as needed for wheezing or shortness of breath.   Yes Historical Provider, MD  ALPRAZolam (XANAX) 0.25 MG tablet Take 0.25 mg by mouth 3 (three) times  daily as needed for anxiety.   Yes Historical Provider, MD  fluticasone furoate-vilanterol (BREO ELLIPTA) 100-25 MCG/INH AEPB Inhale 1 puff into the lungs daily.   Yes Historical Provider, MD  metoprolol tartrate (LOPRESSOR) 25 MG tablet Take 25 mg by mouth 2 (two) times daily.   Yes Historical Provider, MD  mometasone-formoterol (DULERA) 200-5 MCG/ACT AERO Inhale 2 puffs into the lungs 2 (two) times daily.   Yes Historical Provider, MD  omeprazole (PRILOSEC) 20 MG capsule Take 20 mg by mouth daily.   Yes Historical Provider, MD  ondansetron (ZOFRAN) 4 MG tablet Take 4 mg by mouth daily as needed for nausea.   Yes Historical Provider, MD  promethazine (PHENERGAN) 25 MG tablet Take 25 mg by mouth every 8 (eight) hours as needed for nausea or vomiting.   Yes Historical Provider, MD  sertraline (ZOLOFT) 25 MG tablet Take 25 mg by mouth daily.   Yes Historical Provider, MD  tiotropium (SPIRIVA) 18 MCG inhalation capsule Place 18 mcg into inhaler and inhale daily.   Yes Historical Provider, MD  Tiotropium Bromide Monohydrate (SPIRIVA RESPIMAT) 2.5 MCG/ACT AERS Inhale 2 puffs into the lungs daily.    Yes Historical Provider, MD  traMADol (ULTRAM) 50 MG tablet Take 50 mg by mouth every 12 (twelve) hours as needed for moderate pain.   Yes Historical Provider, MD    I have reviewed the patient's current medications.  Labs:  Results for orders placed or performed during the hospital encounter of 11/12/15 (from the past 48 hour(s))  Procalcitonin     Status: None   Collection Time: 11/15/15 12:27 AM  Result Value Ref Range   Procalcitonin 0.71 ng/mL    Comment:        Interpretation: PCT > 0.5 ng/mL and <= 2 ng/mL: Systemic infection (sepsis) is possible, but other conditions are known to elevate PCT as well. (NOTE)         ICU PCT Algorithm               Non ICU PCT Algorithm    ----------------------------     ------------------------------         PCT < 0.25 ng/mL                 PCT < 0.1  ng/mL     Stopping of antibiotics            Stopping of antibiotics       strongly encouraged.               strongly encouraged.    ----------------------------     ------------------------------       PCT level decrease by               PCT < 0.25 ng/mL       >= 80% from peak PCT       OR PCT 0.25 - 0.5 ng/mL          Stopping of antibiotics                                             encouraged.     Stopping of antibiotics           encouraged.    ----------------------------     ------------------------------       PCT level decrease by              PCT >= 0.25 ng/mL       < 80% from peak PCT        AND PCT >= 0.5 ng/mL             Continuing antibiotics                                              encouraged.       Continuing antibiotics            encouraged.    ----------------------------     ------------------------------     PCT level increase compared          PCT > 0.5 ng/mL         with peak PCT AND          PCT >= 0.5 ng/mL             Escalation of antibiotics                                          strongly encouraged.      Escalation of antibiotics        strongly encouraged.  Heparin level (unfractionated)     Status: None   Collection Time: 11/15/15  3:46 AM  Result Value Ref Range   Heparin Unfractionated 0.55 0.30 - 0.70 IU/mL    Comment:        IF HEPARIN RESULTS ARE BELOW EXPECTED VALUES, AND PATIENT DOSAGE HAS BEEN CONFIRMED, SUGGEST FOLLOW UP TESTING OF ANTITHROMBIN III LEVELS.   CBC     Status: Abnormal   Collection Time: 11/15/15  3:46 AM  Result Value Ref Range   WBC 18.8 (H) 4.0 - 10.5 K/uL   RBC 3.91 3.87 - 5.11 MIL/uL   Hemoglobin 11.7 (L) 12.0 - 15.0 g/dL   HCT 36.1 36.0 - 46.0 %   MCV 92.3 78.0 - 100.0 fL   MCH 29.9 26.0 - 34.0 pg   MCHC 32.4 30.0 - 36.0 g/dL   RDW 14.7 11.5 - 15.5 %   Platelets 223 150 - 400 K/uL  Basic metabolic panel     Status: Abnormal   Collection Time: 11/15/15  3:46 AM  Result Value Ref Range   Sodium 136  135 - 145 mmol/L   Potassium 3.9 3.5 - 5.1 mmol/L   Chloride 97 (L) 101 - 111 mmol/L   CO2 21 (L) 22 - 32 mmol/L   Glucose, Bld 103 (H) 65 - 99 mg/dL   BUN 65 (H) 6 - 20 mg/dL   Creatinine, Ser 2.73 (H) 0.44 - 1.00 mg/dL   Calcium 8.0 (L) 8.9 - 10.3 mg/dL   GFR calc non Af Amer 15 (L) >60 mL/min   GFR calc Af Amer 18 (L) >60 mL/min    Comment: (NOTE) The eGFR has been calculated using the CKD EPI equation. This calculation has not been validated in all clinical situations. eGFR's persistently <60 mL/min signify possible Chronic Kidney Disease.    Anion gap 18 (H) 5 - 15  Hepatitis panel, acute     Status: None   Collection Time: 11/15/15 10:18 AM  Result Value Ref Range   Hepatitis B Surface Ag Negative Negative   HCV Ab <0.1 0.0 - 0.9 s/co ratio    Comment: (NOTE)                                  Negative:     < 0.8                             Indeterminate: 0.8 - 0.9                                  Positive:     > 0.9 The CDC recommends that a positive HCV antibody result be followed up with a HCV Nucleic Acid Amplification test (350093). Performed At: [facility], [place] [id number] Lindon Romp MD IR:[id number]    Hep A IgM Negative Negative   Hep B C IgM Negative Negative  CBC     Status: Abnormal   Collection Time: 11/16/15  3:59 AM  Result Value Ref Range   WBC 15.6 (H) 4.0 - 10.5 K/uL   RBC 3.98 3.87 - 5.11 MIL/uL   Hemoglobin 12.0 12.0 - 15.0 g/dL   HCT 36.8 36.0 - 46.0 %   MCV 92.5 78.0 - 100.0 fL   MCH 30.2 26.0 - 34.0 pg  MCHC 32.6 30.0 - 36.0 g/dL   RDW 14.7 11.5 - 15.5 %   Platelets 229 150 - 400 K/uL  Comprehensive metabolic panel     Status: Abnormal   Collection Time: 11/16/15  3:59 AM  Result Value Ref Range   Sodium 137 135 - 145 mmol/L   Potassium 4.1 3.5 - 5.1 mmol/L   Chloride 99 (L) 101 - 111 mmol/L   CO2 22 22 - 32 mmol/L   Glucose, Bld 103 (H) 65 - 99 mg/dL   BUN 70 (H) 6 - 20 mg/dL   Creatinine,  Ser 2.87 (H) 0.44 - 1.00 mg/dL   Calcium 8.2 (L) 8.9 - 10.3 mg/dL   Total Protein 5.2 (L) 6.5 - 8.1 g/dL   Albumin 2.0 (L) 3.5 - 5.0 g/dL   AST 21 15 - 41 U/L   ALT 15 14 - 54 U/L   Alkaline Phosphatase 56 38 - 126 U/L   Total Bilirubin 1.0 0.3 - 1.2 mg/dL   GFR calc non Af Amer 15 (L) >60 mL/min   GFR calc Af Amer 17 (L) >60 mL/min    Comment: (NOTE) The eGFR has been calculated using the CKD EPI equation. This calculation has not been validated in all clinical situations. eGFR's persistently <60 mL/min signify possible Chronic Kidney Disease.    Anion gap 16 (H) 5 - 15  Protime-INR     Status: Abnormal   Collection Time: 11/16/15  3:59 AM  Result Value Ref Range   Prothrombin Time 15.5 (H) 11.6 - 15.2 seconds   INR 1.22 0.00 - 1.49  APTT     Status: None   Collection Time: 11/16/15  3:59 AM  Result Value Ref Range   aPTT 31 24 - 37 seconds     ROS:  A comprehensive review of systems was negative except for: Constitutional: positive for fatigue Respiratory: positive for cough and dyspnea on exertion Gastrointestinal: positive for abdominal pain  Physical Exam: Filed Vitals:   11/16/15 0430 11/16/15 0755  BP: 96/67 112/78  Pulse: 80 84  Temp: 97.6 F (36.4 C) 97.9 F (36.6 C)  Resp: 17 19     General -- NAD Chest -- good expansion. Lungs clear to auscultation. Cardiac -- Irreg/irreg.  Abdomen -- soft, obese, RUQ tenderness,  distended. Extremeties -   +2 pitting edema bilaterally.  Dorsalis pedis pulses present and symmetrical.    Assessment/Plan: 1. AoCKD: Likely hepatorenal syndrome vs. ARB use. FENa <0.1%, BUN/Cr >20:1, urine osmolality 501 making prerenal injury more likely than ATN. Cr stable but increased to 2.87 today. Decreased UOP 0.5L in 24hr. BP  Up today to 112/78. On Metoprolol -- consider holding if BP continues to drop. May need Midodrine if BP gets too low. Still holding Lasix. DCd protonix due to risk of ATN. Avoid nephrotixic meds  (ACE/ARB/NSAID/Contrast). As Cr continues to worsen patient gets closer to requiring HD, not yet to requiring HD, see "Dispo" below. 2. Hyponatremia: Resolved. Will monitor. 3. Dyspnea: CXR w/ midlung opacity. Was on vanc/zosyn. DCd by primary team. Thoracentesis by CCM today. 4. AoCHF: Echo EF45-50%. Manage per cards 5. Cirrhosis w/ Ascites: US abdomen w/ moderate fluid and benign appearing liver cysts x2. Would hold on MRI contrast unless absolutely necessary. May need paracentesis if resp distress or abdominal pain worsens. Manage per primary.   Dispo: Long discussion w/ patient and family (husband, son, daughter-in-law) about pt's poor prognosis with or without HD. Patient's Cr continues to worsen, Not yet requiring HD. However,  patient likely won't tolerate HD very well, especially w/ new liver cirrhosis. We asked patient and family to discuss goals of care and to think ahead to what they would want if kidneys continue to fail. If not HD then would need palliative. -- Unfortunately, prognosis is likely similar for both options, but QOL may not be.   Georges Lynch 11/16/2015, 11:46 AM

## 2015-11-16 NOTE — Procedures (Signed)
Thoracentesis Procedure Note  Pre-operative Diagnosis: Rt pleural effusion  Post-operative Diagnosis: same  Indications: Rt pleural effusion  Procedure Details  Consent: Informed consent was obtained. Risks of the procedure were discussed including: infection, bleeding, pain, pneumothorax.  Under sterile conditions the patient was positioned. Betadine solution and sterile drapes were utilized.  2% buffered lidocaine was used to anesthetize the Rt 6th rib space. Fluid was obtained without any difficulties and minimal blood loss.  A dressing was applied to the wound and wound care instructions were provided.   Findings 500 ml of clear pleural fluid was obtained. A sample was sent to Pathology for cytology, flow, and cell counts, as well as for infection analysis.  Complications:  None; patient tolerated the procedure well.          Condition: stable  Plan A follow up chest x-ray was ordered. Bed Rest for 4 hours. Tylenol 650 mg. for pain.  Attending Attestation: I was present and scrubbed for the entire procedure - performed by Kary Kos NP  Kara Mead MD. FCCP. [facility] Pager [phone] If no response call [phone]   11/16/2015

## 2015-11-16 NOTE — Progress Notes (Signed)
Subjective: No complaints  Objective: Vital signs in last 24 hours: Temp:  [97.3 F (36.3 C)-99 F (37.2 C)] 97.9 F (36.6 C) (04/21 0755) Pulse Rate:  [68-84] 84 (04/21 0755) Resp:  [17-20] 19 (04/21 0755) BP: (79-112)/(36-78) 112/78 mmHg (04/21 0755) SpO2:  [97 %-100 %] 98 % (04/21 0755) Weight:  [270 lb 12.8 oz (122.834 kg)] 270 lb 12.8 oz (122.834 kg) (04/21 0430) Weight change: 2 lb 12.8 oz (1.27 kg) Last BM Date: 11/14/15 Intake/Output from previous day: 04/20 0701 - 04/21 0700 In: 409.4 [P.O.:180; I.V.:229.4] Out: 500 [Urine:500] Intake/Output this shift:    PE: General:Pleasant affect, NAD Skin:Warm and dry, brisk capillary refill HEENT:normocephalic, sclera clear, mucus membranes moist still with laryngitis.  Heart:irreg irreg without murmur, gallup, rub or click Lungs:clear, ant,  without rales, rhonchi, or wheezes [id number], non tender, + BS, do not palpate liver spleen or masses Ext:2-3+ lower ext edema,  2+ radial pulses Neuro:alert and oriented X 3, MAE, follows commands, + facial symmetry Tele a fib rate controlled.    Lab Results:  Recent Labs  11/15/15 0346 11/16/15 0359  WBC 18.8* 15.6*  HGB 11.7* 12.0  HCT 36.1 36.8  PLT 223 229   BMET  Recent Labs  11/15/15 0346 11/16/15 0359  NA 136 137  K 3.9 4.1  CL 97* 99*  CO2 21* 22  GLUCOSE 103* 103*  BUN 65* 70*  CREATININE 2.73* 2.87*  CALCIUM 8.0* 8.2*    Recent Labs  11/13/15 1120  TROPONINI 0.07*    No results found for: CHOL, HDL, LDLCALC, LDLDIRECT, TRIG, CHOLHDL No results found for: HGBA1C   Lab Results  Component Value Date   TSH 3.124 11/13/2015    Hepatic Function Panel  Recent Labs  11/16/15 0359  PROT 5.2*  ALBUMIN 2.0*  AST 21  ALT 15  ALKPHOS 56  BILITOT 1.0   No results for input(s): CHOL in the last 72 hours. No results for input(s): PROTIME in the last 72 hours.     Studies/Results: US Abdomen Complete  11/14/2015  CLINICAL DATA:   Ascites. Evaluate liver. Acute renal failure. History of hypertension and congestive heart failure. History of a cholecystectomy. EXAM: ABDOMEN ULTRASOUND COMPLETE COMPARISON:  11/13/2015 FINDINGS: Gallbladder: Surgically absent. Common bile duct: Diameter: 6 mm Liver: 2 large cystic areas, 1 projecting along the anterior segment right lobe/medial segmental left lobe measuring 8.9 x 7.8 x 10.3 cm. The other arising from the posterior aspect of the right lobe measuring 6.6 x 6.1 x 7.3 cm. Liver demonstrates a coarsened echotexture with a nodular surface contour. Liver normal in overall size. IVC: Limited visualization.  No gross abnormality. Pancreas: Limited visualization.  No gross abnormality. Spleen: Size and appearance within normal limits. Right Kidney: Length: 10.6 cm. Mild renal cortical thinning. Echogenicity within normal limits. No mass or hydronephrosis visualized. Left Kidney: Length: 10.9 cm. Mild renal cortical thinning. Echogenicity within normal limits. No mass or hydronephrosis visualized. Abdominal aorta: Not well seen from the midportion to the bifurcation. Gross aneurysm. Other findings: Moderate amount of ascites similar to the previous day's study. Right pleural effusion. IMPRESSION: 1. Appearance of the liver is consistent with cirrhosis. There are 2 large with her cystic masses, likely simple hepatic cysts. Consider follow-up liver MRI with and without contrast for further assessment and lesion characterization. 2. No acute findings. 3. Moderate ascites. Electronically Signed   By: Lajean Manes M.D.   On: 11/14/2015 15:43    Medications:  I have reviewed the patient's current medications. Scheduled Meds: . feeding supplement  1 Container Oral BID BM  . fluconazole (DIFLUCAN) IV  100 mg Intravenous Q24H  . magic mouthwash  5 mL Oral TID  . metoprolol tartrate  12.5 mg Oral BID  . midodrine  5 mg Oral TID WC  . mometasone-formoterol  2 puff Inhalation BID  . ondansetron (ZOFRAN) IV   4 mg Intravenous Once  . sertraline  25 mg Oral Daily  . sodium chloride flush  3 mL Intravenous Q12H  . sodium chloride flush  3 mL Intravenous Q12H   Continuous Infusions:  PRN Meds:.sodium chloride, acetaminophen **OR** acetaminophen, ALPRAZolam, HYDROcodone-acetaminophen, ondansetron **OR** ondansetron (ZOFRAN) IV, sodium chloride flush, traMADol  Assessment/Plan:  1. Massive fluid overload likely combination of acute on chronic diastolic heart failure and hypoalbuminemia - Cr went up slightly with diuresis. Cr 2.87 today with BUN 70 Lasix stopped  -- Nephrology has seen see below  -- Likely also need paracentesis as I doubt we can remove significant fluid even with aggressive diuresis - --did not put out much, was -1L on 80mg  BID lasix. Now off lasix   --- was negative 80 yesterday wt continues to climb   2. Newly diagnosed atrial fibrillation - CHA2DS2-Vasc score 3 (HF, female, HTN) - IV heparin, likely will need coumadin prior to discharge, no NOAC given renal issue. Need to coordinate starting coumadin with IM and PCCM to see if she need paracentesis or thoracetesis  rate is mostly in 70-80s.    3. Recurrent R pleural effusion: per PCCM  4. Large ascites: may need paracentesis per CCM ---GI has seen possible new dx cirrhosis suspect etiology is NASH  5. Recent Klebsiella pneumonia UTI treated at [facility]  6. Possible sepsis with elevated WBC 22: per IM  7. AoCKD: Per renal: "suspect cardiorenal syndrome vs. OP ARB use. FENa <0.1%, BUN/Cr >20:1, urine osmolality 501 making prerenal injury more likely than ATN. However, granular cast noted on UA. Cr up to 2.74 today after IV lasix. UOP 1.7L in 24hr. BP down to 80s/60s. Would hold Lasix for now. Fluid restrict. Avoid nephrotixic meds" --Cr continues to climb.  2.74>>2.73>>2.87 and BUN 70   8. LV dysfunction 45-50% by Echo no  regional wall motion abnormalities. Trivial mitral regurgitation  9. Elevated troponin 0.11 at pk, may be due to a fib demand ischemia  10. Hypotension Systolic BP in [id number] today. I added parameters to BB. Difficult need to control HR but BP low, with acute renal failure would not add dig. Dr. Sallyanne Kuster to address. Metoprolol at 12.5 BID.  Midodrine has been added.    palliative care has been recommended.       LOS: 4 days   Time spent with pt. :  15 minutes. [facility] R  Nurse Practitioner Certified Pager [phone] or after 5pm and on weekends call [phone] 11/16/2015, 10:14 AM   I have seen and examined the patient along with [facility] R NP.  I have reviewed the chart, notes and new data.  I agree with NP's note.  Key new complaints: very weak, no dyspnea at rest Key examination changes: remains grossly fluid overloaded, adequate rate control in AFib, BP borderline Key new findings / data: slow worsening of renal function.  PLAN: Difficult situation, with cirrhosis, renal failure and diastolic heart failure all contributing to fluid retention and low BP limiting use of diuretics. Current ventricular rate control is fair. I do not think she is a good  candidate for cardioversion or cardiac cath.  Sanda Klein, MD, [place] [phone] 11/16/2015, 11:44 AM

## 2015-11-17 DIAGNOSIS — K7460 Unspecified cirrhosis of liver: Secondary | ICD-10-CM

## 2015-11-17 DIAGNOSIS — N179 Acute kidney failure, unspecified: Secondary | ICD-10-CM

## 2015-11-17 LAB — CULTURE, BLOOD (ROUTINE X 2)
CULTURE: NO GROWTH
CULTURE: NO GROWTH

## 2015-11-17 LAB — HEPARIN LEVEL (UNFRACTIONATED)
HEPARIN UNFRACTIONATED: 0.4 [IU]/mL (ref 0.30–0.70)
Heparin Unfractionated: 0.33 IU/mL (ref 0.30–0.70)

## 2015-11-17 LAB — BASIC METABOLIC PANEL
Anion gap: 17 — ABNORMAL HIGH (ref 5–15)
BUN: 76 mg/dL — AB (ref 6–20)
CALCIUM: 8.2 mg/dL — AB (ref 8.9–10.3)
CO2: 17 mmol/L — ABNORMAL LOW (ref 22–32)
CREATININE: 2.84 mg/dL — AB (ref 0.44–1.00)
Chloride: 99 mmol/L — ABNORMAL LOW (ref 101–111)
GFR calc Af Amer: 17 mL/min — ABNORMAL LOW (ref >60)
GFR, EST NON AFRICAN AMERICAN: 15 mL/min — AB (ref >60)
GLUCOSE: 108 mg/dL — AB (ref 65–99)
Potassium: 4.3 mmol/L (ref 3.5–5.1)
SODIUM: 133 mmol/L — AB (ref 135–145)

## 2015-11-17 LAB — CBC
HCT: 36.3 % (ref 36.0–46.0)
Hemoglobin: 12.1 g/dL (ref 12.0–15.0)
MCH: 30.9 pg (ref 26.0–34.0)
MCHC: 33.3 g/dL (ref 30.0–36.0)
MCV: 92.8 fL (ref 78.0–100.0)
PLATELETS: 236 10*3/uL (ref 150–400)
RBC: 3.91 MIL/uL (ref 3.87–5.11)
RDW: 15.1 % (ref 11.5–15.5)
WBC: 18 10*3/uL — ABNORMAL HIGH (ref 4.0–10.5)

## 2015-11-17 MED ORDER — SODIUM BICARBONATE 650 MG PO TABS
650.0000 mg | ORAL_TABLET | Freq: Two times a day (BID) | ORAL | Status: DC
  Administered 2015-11-17 – 2015-11-21 (×9): 650 mg via ORAL
  Filled 2015-11-17 (×9): qty 1

## 2015-11-17 MED ORDER — PROCHLORPERAZINE EDISYLATE 5 MG/ML IJ SOLN
10.0000 mg | Freq: Once | INTRAMUSCULAR | Status: AC
  Administered 2015-11-17: 10 mg via INTRAVENOUS
  Filled 2015-11-17 (×2): qty 2

## 2015-11-17 NOTE — Progress Notes (Signed)
ANTICOAGULATION CONSULT NOTE - Follow Up Consult  Pharmacy Consult for heparin Indication: atrial fibrillation   Labs:  Recent Labs  11/14/15 0622 11/15/15 0346 11/16/15 0359 11/17/15 0218  HGB 12.1 11.7* 12.0 12.1  HCT 37.1 36.1 36.8 36.3  PLT 211 223 229 236  APTT  --   --  31  --   LABPROT  --   --  15.5*  --   INR  --   --  1.22  --   HEPARINUNFRC 0.66 0.55  --  0.33  CREATININE 2.74* 2.73* 2.87*  --      Assessment/Plan:  82yo female therapeutic on heparin after resumed. Will continue gtt at current rate and confirm stable with additional level.   Wynona Neat, PharmD, BCPS  11/17/2015,2:58 AM

## 2015-11-17 NOTE — Progress Notes (Signed)
Upon doing nursing bedside report, pt vomiting.  Pt and family state the nausea medication seems to make her nausea and vomiting worse.  MD on call text paged and advised.  Will continue to monitor closely.

## 2015-11-17 NOTE — Progress Notes (Signed)
[facility] Renal Consultation Note  Requesting MD:  [facility] Indication for Consultation: Acute on Chronic Kidney Disease  S: Kendra Martinez is a 82 y.o. female with a PMH significant for dCHF and recent admission for PNA (Klebsiella) at an outside hospital presenting with SOB and worsening bilateral edema over the past few weeks.   Had thoracentesis yesterday.  Reports feeling somewhat better today.  Patient with nausea and vomiting this am.  Notes that she feels a little better now than she did this morning.  Family still unsure as to whether or not HD is an option.  Have not made decisions about comfort care.  They would like to wait a little longer before making decisions about her care.  Medications: Prior to Admission medications   Medication Sig Start Date End Date Taking? Authorizing Provider  albuterol (PROVENTIL HFA;VENTOLIN HFA) 108 (90 Base) MCG/ACT inhaler Inhale 1-2 puffs into the lungs every 4 (four) hours as needed for wheezing or shortness of breath.   Yes Historical Provider, MD  ALPRAZolam (XANAX) 0.25 MG tablet Take 0.25 mg by mouth 3 (three) times daily as needed for anxiety.   Yes Historical Provider, MD  fluticasone furoate-vilanterol (BREO ELLIPTA) 100-25 MCG/INH AEPB Inhale 1 puff into the lungs daily.   Yes Historical Provider, MD  metoprolol tartrate (LOPRESSOR) 25 MG tablet Take 25 mg by mouth 2 (two) times daily.   Yes Historical Provider, MD  mometasone-formoterol (DULERA) 200-5 MCG/ACT AERO Inhale 2 puffs into the lungs 2 (two) times daily.   Yes Historical Provider, MD  omeprazole (PRILOSEC) 20 MG capsule Take 20 mg by mouth daily.   Yes Historical Provider, MD  ondansetron (ZOFRAN) 4 MG tablet Take 4 mg by mouth daily as needed for nausea.   Yes Historical Provider, MD  promethazine (PHENERGAN) 25 MG tablet Take 25 mg by mouth every 8 (eight) hours as needed for nausea or vomiting.   Yes Historical Provider, MD  sertraline (ZOLOFT) 25 MG tablet Take 25  mg by mouth daily.   Yes Historical Provider, MD  tiotropium (SPIRIVA) 18 MCG inhalation capsule Place 18 mcg into inhaler and inhale daily.   Yes Historical Provider, MD  Tiotropium Bromide Monohydrate (SPIRIVA RESPIMAT) 2.5 MCG/ACT AERS Inhale 2 puffs into the lungs daily.    Yes Historical Provider, MD  traMADol (ULTRAM) 50 MG tablet Take 50 mg by mouth every 12 (twelve) hours as needed for moderate pain.   Yes Historical Provider, MD    I have reviewed the patient's current medications.  Labs:  Results for orders placed or performed during the hospital encounter of 11/12/15 (from the past 24 hour(s))  Culture, body fluid-bottle     Status: None (Preliminary result)   Collection Time: 11/16/15 12:40 PM  Result Value Ref Range   Specimen Description FLUID RIGHT PLEURAL    Special Requests BOTTLES DRAWN AEROBIC AND ANAEROBIC 3CC    Culture NO GROWTH < 24 HOURS    Report Status PENDING   Gram stain     Status: None   Collection Time: 11/16/15 12:40 PM  Result Value Ref Range   Specimen Description FLUID RIGHT PLEURAL    Special Requests NONE    Gram Stain      ABUNDANT WBC PRESENT,BOTH PMN AND MONONUCLEAR NO ORGANISMS SEEN    Report Status 11/16/2015 FINAL   Lactate dehydrogenase (CSF, pleural or peritoneal fluid)     Status: Abnormal   Collection Time: 11/16/15 12:47 PM  Result Value Ref Range   LD,  Fluid 139 (H) 3 - 23 U/L   Fluid Type-FLDH Pleural R   Protein, pleural or peritoneal fluid     Status: None   Collection Time: 11/16/15 12:47 PM  Result Value Ref Range   Total protein, fluid <3.0 g/dL   Fluid Type-FTP PLEURAL   Body fluid cell count with differential     Status: Abnormal   Collection Time: 11/16/15 12:47 PM  Result Value Ref Range   Fluid Type-FCT PLEURAL    Color, Fluid YELLOW (A) YELLOW   Appearance, Fluid HAZY (A) CLEAR   WBC, Fluid 1592 (H) 0 - 1000 cu mm   Neutrophil Count, Fluid 56 (H) 0 - 25 %   Lymphs, Fluid 10 %   Monocyte-Macrophage-Serous Fluid  34 (L) 50 - 90 %   Eos, Fluid 0 %  Heparin level (unfractionated)     Status: None   Collection Time: 11/17/15  2:18 AM  Result Value Ref Range   Heparin Unfractionated 0.33 0.30 - 0.70 IU/mL  CBC     Status: Abnormal   Collection Time: 11/17/15  2:18 AM  Result Value Ref Range   WBC 18.0 (H) 4.0 - 10.5 K/uL   RBC 3.91 3.87 - 5.11 MIL/uL   Hemoglobin 12.1 12.0 - 15.0 g/dL   HCT 36.3 36.0 - 46.0 %   MCV 92.8 78.0 - 100.0 fL   MCH 30.9 26.0 - 34.0 pg   MCHC 33.3 30.0 - 36.0 g/dL   RDW 15.1 11.5 - 15.5 %   Platelets 236 150 - 400 K/uL  Basic metabolic panel     Status: Abnormal   Collection Time: 11/17/15  2:18 AM  Result Value Ref Range   Sodium 133 (L) 135 - 145 mmol/L   Potassium 4.3 3.5 - 5.1 mmol/L   Chloride 99 (L) 101 - 111 mmol/L   CO2 17 (L) 22 - 32 mmol/L   Glucose, Bld 108 (H) 65 - 99 mg/dL   BUN 76 (H) 6 - 20 mg/dL   Creatinine, Ser 2.84 (H) 0.44 - 1.00 mg/dL   Calcium 8.2 (L) 8.9 - 10.3 mg/dL   GFR calc non Af Amer 15 (L) >60 mL/min   GFR calc Af Amer 17 (L) >60 mL/min   Anion gap 17 (H) 5 - 15  Heparin level (unfractionated)     Status: None   Collection Time: 11/17/15 10:10 AM  Result Value Ref Range   Heparin Unfractionated 0.40 0.30 - 0.70 IU/mL    Physical Exam: Filed Vitals:   11/17/15 0313 11/17/15 0800  BP: 111/70 116/72  Pulse: 89 88  Temp: 97.5 F (36.4 C) 97.6 F (36.4 C)  Resp: 18 18     General -- NAD, resting in bed, family at bedside Chest -- normal WOB, occ cough Cardiac -- Irreg/irreg.  Abdomen -- soft, obese, RUQ tenderness, distended. Extremeties -   +2 pitting edema bilaterally.  Dorsalis pedis pulses present and symmetrical.  Neuro: somnolent but responds to questions  Assessment/Plan: 1. AoCKD: Likely hepatorenal syndrome vs. ARB use. FENa <0.1%, BUN/Cr >20:1, urine osmolality 501 making prerenal injury more likely than ATN. Cr stable 2.84 today. Decreased UOP 350cc in 24hr. BP stable. On Metoprolol.  Avoid nephrotixic meds (ACE/  ARB/ NSAID/ Contrast).  2. Hyponatremia: Na 133 this morning. Will monitor. 3. Dyspnea: CXR w/ midlung opacity. Was on vanc/zosyn. DCd by primary team. S/p Thoracentesis 4/21, only about 500cc off.  LDH elevated 139, <3.0 protein, Fluid WBC 1592, no organisms on gram stain,  Fluid cx NGTD. Cytology still pending. 4. AoCHF: Echo EF45-50%. Manage per cards 5. Cirrhosis w/ Ascites: US abdomen w/ moderate fluid and benign appearing liver cysts x2. Would hold on MRI contrast unless absolutely necessary.  Manage per primary.   Dispo: Discussion continued with family.  Again, noted that QOL likely poor on HD and longevity not necessarily increased on HD.  Family continues to seek unifying diagnosis as to why patient is having multiorgan failure and asking for further testing.  Will order ANA and anti-GBM.   Hepatitis panel already performed this hospitalization and was negative. Though think that patient's GOC should really be the focus in the next few days.   Ronnie Doss, DO 11/17/2015, 11:33 AM

## 2015-11-17 NOTE — Plan of Care (Signed)
Problem: Phase I Progression Outcomes Goal: Tolerating diet Outcome: Not Progressing Receiving zofran Q4 hours prn for nausea

## 2015-11-17 NOTE — Progress Notes (Signed)
Patient Name: Kendra Martinez Date of Encounter: 11/17/2015  Principal Problem:   Acute on chronic diastolic CHF (congestive heart failure), NYHA class 4 (HCC) Active Problems:   Pleural effusion   Essential hypertension   Hyponatremia   Sepsis ([place])   Anasarca   New onset a-fib (HCC)   Elevated troponin   Acute respiratory failure with hypoxemia (HCC)   Ascites   ARF (acute renal failure) (HCC)   Cirrhosis of liver with ascites ([place])   Arterial hypotension   Length of Stay: 5  SUBJECTIVE  Has back pain and feels weak. Breathing better after thoracentesis (as expected, transsudative fluid). Creatinine unchanged, but she is oliguric.  CURRENT MEDS . feeding supplement  1 Container Oral BID BM  . fluconazole (DIFLUCAN) IV  100 mg Intravenous Q24H  . magic mouthwash  5 mL Oral TID  . metoprolol tartrate  12.5 mg Oral BID  . midodrine  5 mg Oral TID WC  . mometasone-formoterol  2 puff Inhalation BID  . ondansetron (ZOFRAN) IV  4 mg Intravenous Once  . sertraline  25 mg Oral Daily  . sodium bicarbonate  650 mg Oral BID  . sodium chloride flush  3 mL Intravenous Q12H  . sodium chloride flush  3 mL Intravenous Q12H    OBJECTIVE   Intake/Output Summary (Last 24 hours) at 11/17/15 0955 Last data filed at 11/17/15 0500  Gross per 24 hour  Intake 643.53 ml  Output    350 ml  Net 293.53 ml   Filed Weights   11/15/15 0400 11/16/15 0430 11/17/15 0400  Weight: 121.564 kg (268 lb) 122.834 kg (270 lb 12.8 oz) 122.335 kg (269 lb 11.2 oz)    PHYSICAL EXAM Filed Vitals:   11/16/15 2334 11/17/15 0313 11/17/15 0400 11/17/15 0800  BP: 114/74 111/70  116/72  Pulse: 81 89  88  Temp: 98.6 F (37 C) 97.5 F (36.4 C)  97.6 F (36.4 C)  TempSrc: Axillary Oral  Oral  Resp: 18 18  18   Height:      Weight:   122.335 kg (269 lb 11.2 oz)   SpO2: 96% 97%  98%   General: Alert, oriented x3, no distress Head: no evidence of trauma, PERRL, EOMI, no exophtalmos or lid lag, no myxedema,  no xanthelasma; normal ears, nose and oropharynx Neck: normal jugular venous pulsations and no hepatojugular reflux; brisk carotid pulses without delay and no carotid bruits Chest: clear to auscultation, no signs of consolidation by percussion or palpation, normal fremitus, symmetrical and full respiratory excursions Cardiovascular: normal position and quality of the apical impulse, irregular rhythm, normal first and second heart sounds, no rubs or gallops, no murmur Abdomen: ascites, no masses by palpation, no abnormal pulsatility or arterial bruits, normal bowel sounds, unable to palpate liver or spleen. Extremities: no clubbing, cyanosis; bilateral 3-4+ pitting calf and thigh edema; 2+ radial, ulnar and brachial pulses bilaterally; 2+ right femoral, posterior tibial and dorsalis pedis pulses; 2+ left femoral, posterior tibial and dorsalis pedis pulses; no subclavian or femoral bruits Neurological: grossly nonfocal  LABS  CBC  Recent Labs  11/16/15 0359 11/17/15 0218  WBC 15.6* 18.0*  HGB 12.0 12.1  HCT 36.8 36.3  MCV 92.5 92.8  PLT 229 [id number]   Basic Metabolic Panel  Recent Labs  11/16/15 0359 11/17/15 0218  NA 137 133*  K 4.1 4.3  CL 99* 99*  CO2 22 17*  GLUCOSE 103* 108*  BUN 70* 76*  CREATININE 2.87* 2.84*  CALCIUM 8.2* 8.2*  Liver Function Tests  Recent Labs  11/16/15 0359  AST 21  ALT 15  ALKPHOS 56  BILITOT 1.0  PROT 5.3*  5.2*  ALBUMIN 2.0*    Radiology Studies Imaging results have been reviewed and Dg Chest Port 1 View  11/16/2015  CLINICAL DATA:  Status post right thoracentesis. EXAM: PORTABLE CHEST 1 VIEW COMPARISON:  11/14/2015 FINDINGS: Normal heart size. There is scratch set there has been interval decrease in volume of the right pleural effusion. No pneumothorax identified. Asymmetric elevation of left hemidiaphragm is again noted. IMPRESSION: 1. Decrease in volume of right pleural effusion following thoracentesis. No pneumothorax. Electronically  Signed   By: Kerby Moors M.D.   On: 11/16/2015 14:09    TELE AF, controlled rate  ASSESSMENT AND PLAN   1. Massive fluid overload likely combination of acute on chronic diastolic heart failure, liver cirrhosis, acute renal failure and hypoalbuminemia - absence of elevated JVP suggests liver disease may be a big contributor, but her physical exam is challenging due to obesity - treatment limited by renal failure. Creatinine at a stale mate, but urine output is very poor - unlikely to improve without diuresis and/or paracentesis, both of which would lead to worsening renal failure - if not a dialysis candidate, prognosis is grim  2. Newly diagnosed atrial fibrillation - CHA2DS2-Vasc score 3 (HF, female, HTN) - IV heparin  3. R pleural effusion s/p thoracentesis  4. Large ascites: may need paracentesis  5. Recent Klebsiella pneumonia UTI [facility]  6. Persistently elevated WBC   7. Hypotension improved on midodrine   Sanda Klein, MD, [facility] [phone] office [phone] pager 11/17/2015 9:55 AM

## 2015-11-17 NOTE — Progress Notes (Signed)
Pulmonary Service f/u Hosp visit  Studies: Echo 4/19  EF 45%  R Tcentesis 4/21 x 500 cc transudate with elevated PMN's    S a bit more comfortable today breathing no cough or cp  O   Patient Vitals for the past 24 hrs:  BP Temp Temp src Pulse Resp SpO2 Weight  11/17/15 1200 111/76 mmHg 97.8 F (36.6 C) Axillary 80 20 99 % -  11/17/15 0800 116/72 mmHg 97.6 F (36.4 C) Oral 88 18 98 % -  11/17/15 0400 - - - - - - 269 lb 11.2 oz (122.335 kg)  11/17/15 0313 111/70 mmHg 97.5 F (36.4 C) Oral 89 18 97 % -  11/16/15 2334 114/74 mmHg 98.6 F (37 C) Axillary 81 18 96 % -  11/16/15 2005 114/77 mmHg 98 F (36.7 C) Oral 87 18 97 % -     Intake/Output Summary (Last 24 hours) at 11/17/15 1242 Last data filed at 11/17/15 0900  Gross per 24 hour  Intake 703.53 ml  Output    350 ml  Net 353.53 ml  Gen:  HOB 30 degrees/ no increased wob on 2lpm NP with sats 99%   Lungs with decreased bs bases bilaterally RRR no s3  abd obese Ext 1-2+ pitting bilaterally   cxr 4/21 improved   R effusion  transudative but WBC 1592 with P>L  cyt pending, cultures pending    Imp  R effusion in pt with vol overload, diastolic dysfunction and cri / obvious vol overload but may have several different mechanisms as it is very unusual to see transudative features with such high wbc's  Rec:  No change rx thru w/e/ await final results on R effusion and f/uw 4/24   Discussed with fm at bedsime   Christinia Gully, MD Pulmonary and [place] [phone] After 5:30 PM or weekends, call [phone]

## 2015-11-17 NOTE — Progress Notes (Signed)
[facility] PROGRESS NOTE  Kendra Martinez [id number] DOB: Nov 28, 1933 DOA: 11/12/2015  PCP: She has recently relocated to this area. Does not have any providers here.  Brief HPI: 82 year old Caucasian female with a past medical history of hypertension, diastolic CHF, recently treated pneumonia at an outside facility, presented with was needing shortness of breath. Patient is new to [place]. She has recently relocated to this area. Does not see any providers here.  Past medical history:  Past Medical History  Diagnosis Date  . CHF (congestive heart failure) ([place])   . Hypertension   . Pleural effusion 10/2015  . Bipolar disorder ([place])   . GERD (gastroesophageal reflux disease)   . Ascites   . Arthritis     oa in knees    Consultants: Cardiology. Pulmonology. Nephrology. Gastroenterology  Procedures:  Transthoracic echocardiogram Study Conclusions - Left ventricle: The cavity size was normal. There was mild focal basal hypertrophy of the septum. Systolic function was mildly reduced. The estimated ejection fraction was in the range of 45% to 50%. Wall motion was normal; there were no regional wall motion abnormalities. - Aortic valve: Trileaflet; mildly thickened, mildly calcified leaflets. - Mitral valve: Calcified annulus. There was trivial regurgitation.   Antibiotics: Vancomycin and Zosyn 4/17--4/20  Subjective: Patient Feels about the same. Breathing is a little better after thoracentesis. Continues to feel weak and fatigued. Husband and son at bedside.   Objective:  Vital Signs  Filed Vitals:   11/16/15 2005 11/16/15 2334 11/17/15 0313 11/17/15 0400  BP: 114/77 114/74 111/70   Pulse: 87 81 89   Temp: 98 F (36.7 C) 98.6 F (37 C) 97.5 F (36.4 C)   TempSrc: Oral Axillary Oral   Resp: 18 18 18    Height:      Weight:    122.335 kg (269 lb 11.2 oz)  SpO2: 97% 96% 97%     Intake/Output Summary (Last 24 hours) at 11/17/15 0901 Last data filed at  11/17/15 0500  Gross per 24 hour  Intake 643.53 ml  Output    350 ml  Net 293.53 ml   Filed Weights   11/15/15 0400 11/16/15 0430 11/17/15 0400  Weight: 121.564 kg (268 lb) 122.834 kg (270 lb 12.8 oz) 122.335 kg (269 lb 11.2 oz)    General appearance: alert, cooperative, appears stated age and no distress. Fatigued. Resp: Improved air entry on the right. No definite crackles. No wheezing or rhonchi. Cardio: regular rate and rhythm, S1, S2 normal, no murmur, click, rub or gallop GI: soft, non-tender; bowel sounds normal; no masses,  no organomegaly Extremities: Bilateral pedal edema is present. Seems to be better. Neurologic: Alert and oriented X 3. No focal deficits.  Lab Results:  Data Reviewed: I have personally reviewed following labs and imaging studies  CBC:  Recent Labs Lab 11/12/15 1619 11/13/15 0440 11/14/15 0622 11/15/15 0346 11/16/15 0359 11/17/15 0218  WBC 22.3* 18.9* 18.5* 18.8* 15.6* 18.0*  NEUTROABS 19.9*  --   --   --   --   --   HGB 13.0 12.5 12.1 11.7* 12.0 12.1  HCT 39.2 37.9 37.1 36.1 36.8 36.3  MCV 92.7 92.7 93.0 92.3 92.5 92.8  PLT 219 217 211 223 229 [id number]   Basic Metabolic Panel:  Recent Labs Lab 11/13/15 0440 11/14/15 0622 11/15/15 0346 11/16/15 0359 11/17/15 0218  NA 132* 133* 136 137 133*  K 4.1 4.4 3.9 4.1 4.3  CL 96* 94* 97* 99* 99*  CO2 19* 21* 21* 22 17*  GLUCOSE 116* 109* 103* 103* 108*  BUN 52* 63* 65* 70* 76*  CREATININE 2.47* 2.74* 2.73* 2.87* 2.84*  CALCIUM 8.0* 8.1* 8.0* 8.2* 8.2*  MG 1.6*  --   --   --   --   PHOS 3.9  --   --   --   --    GFR: Estimated Creatinine Clearance: 20.4 mL/min (by C-G formula based on Cr of 2.84). Liver Function Tests:  Recent Labs Lab 11/12/15 2330 11/13/15 0440 11/16/15 0359  AST 21 25 21   ALT 15 18 15   ALKPHOS 55 59 56  BILITOT 1.1 1.4* 1.0  PROT 5.2* 5.3* 5.3*  5.2*  ALBUMIN 2.2* 2.3* 2.0*   Coagulation Profile:  Recent Labs Lab 11/13/15 0440 11/16/15 0359  INR 1.59*  1.22   Cardiac Enzymes:  Recent Labs Lab 11/12/15 2330 11/13/15 0440 11/13/15 1120  TROPONINI 0.11* 0.08* 0.07*   Urine analysis:    Component Value Date/Time   COLORURINE YELLOW 11/13/2015 1132   APPEARANCEUR CLOUDY* 11/13/2015 1132   LABSPEC 1.023 11/13/2015 1132   PHURINE 5.5 11/13/2015 1132   GLUCOSEU NEGATIVE 11/13/2015 1132   HGBUR NEGATIVE 11/13/2015 1132   BILIRUBINUR MODERATE* 11/13/2015 1132   [place] 11/13/2015 1132   PROTEINUR 30* 11/13/2015 1132   NITRITE NEGATIVE 11/13/2015 1132   LEUKOCYTESUR MODERATE* 11/13/2015 1132    Recent Results (from the past 240 hour(s))  Blood culture (routine x 2)     Status: None (Preliminary result)   Collection Time: 11/12/15  8:20 PM  Result Value Ref Range Status   Specimen Description BLOOD RIGHT ANTECUBITAL  Final   Special Requests IN PEDIATRIC BOTTLE 3CC  Final   Culture NO GROWTH 4 DAYS  Final   Report Status PENDING  Incomplete  Blood culture (routine x 2)     Status: None (Preliminary result)   Collection Time: 11/12/15  8:25 PM  Result Value Ref Range Status   Specimen Description BLOOD RIGHT HAND  Final   Special Requests IN PEDIATRIC BOTTLE 3CC  Final   Culture NO GROWTH 4 DAYS  Final   Report Status PENDING  Incomplete  Urine culture     Status: Abnormal   Collection Time: 11/13/15 11:32 AM  Result Value Ref Range Status   Specimen Description URINE, CLEAN CATCH  Final   Special Requests NONE  Final   Culture 60,000 COLONIES/mL YEAST (A)  Final   Report Status 11/14/2015 FINAL  Final  MRSA PCR Screening     Status: None   Collection Time: 11/13/15  5:21 PM  Result Value Ref Range Status   MRSA by PCR NEGATIVE NEGATIVE Final    Comment:        The GeneXpert MRSA Assay (FDA approved for NASAL specimens only), is one component of a comprehensive MRSA colonization surveillance program. It is not intended to diagnose MRSA infection nor to guide or monitor treatment for MRSA infections.     Gram stain     Status: None   Collection Time: 11/16/15 12:40 PM  Result Value Ref Range Status   Specimen Description FLUID RIGHT PLEURAL  Final   Special Requests NONE  Final   Gram Stain   Final    ABUNDANT WBC PRESENT,BOTH PMN AND MONONUCLEAR NO ORGANISMS SEEN    Report Status 11/16/2015 FINAL  Final      Radiology Studies: Dg Chest Port 1 View  11/16/2015  CLINICAL DATA:  Status post right thoracentesis. EXAM: PORTABLE CHEST 1 VIEW COMPARISON:  11/14/2015  FINDINGS: Normal heart size. There is scratch set there has been interval decrease in volume of the right pleural effusion. No pneumothorax identified. Asymmetric elevation of left hemidiaphragm is again noted. IMPRESSION: 1. Decrease in volume of right pleural effusion following thoracentesis. No pneumothorax. Electronically Signed   By: Kerby Moors M.D.   On: 11/16/2015 14:09     Medications:  Scheduled: . feeding supplement  1 Container Oral BID BM  . fluconazole (DIFLUCAN) IV  100 mg Intravenous Q24H  . magic mouthwash  5 mL Oral TID  . metoprolol tartrate  12.5 mg Oral BID  . midodrine  5 mg Oral TID WC  . mometasone-formoterol  2 puff Inhalation BID  . ondansetron (ZOFRAN) IV  4 mg Intravenous Once  . sertraline  25 mg Oral Daily  . sodium chloride flush  3 mL Intravenous Q12H  . sodium chloride flush  3 mL Intravenous Q12H   Continuous: . heparin 1,050 Units/hr (11/17/15 0500)   [id number] chloride, acetaminophen **OR** acetaminophen, ALPRAZolam, HYDROcodone-acetaminophen, ondansetron **OR** ondansetron (ZOFRAN) IV, sodium chloride flush, traMADol  Assessment/Plan:  Principal Problem:   Acute on chronic diastolic CHF (congestive heart failure), NYHA class 4 (HCC) Active Problems:   Pleural effusion   Essential hypertension   Hyponatremia   Sepsis ([place])   Anasarca   New onset a-fib (HCC)   Elevated troponin   Acute respiratory failure with hypoxemia (HCC)   Ascites   ARF (acute renal failure)  (HCC)   Cirrhosis of liver with ascites (HCC)   Arterial hypotension    Right-sided pleural effusion Etiology is unclear but probably transudative secondary to liver cirrhosis or heart failure. He was seen by pulmonology. Patient underwent thoracentesis yesterday. LDH in the fluid was 139. Total protein less than 3. This is suggestive of a transudative process. Cultures are ending. Most likely due to her liver process. CT scan of the chest did not show any infiltrates. Echocardiogram only showed minimally depressed ejection fraction. PA pressure is noted to be normal.   Anasarca with ascites, pleural effusion,  According to patient reports she could've gained approximately 40-50 pounds or even more over the last many months. Reason for her presentation is not entirely clear, but studies done so far suggests liver cirrhosis could be the main reason. Echocardiogram shows EF of 45-50%. PA pressures are normal. Ultrasound did show evidence for liver cirrhosis. She has hypoalbuminemia. Her PT/INR was slightly elevated. Patient denies any history of liver disease, hepatitis. Hepatitis panel is negative. Lasix was discontinued due to worsening creatinine. No abdominal tenderness on examination. Initially paracentesis was considered. However, due to renal failure, this was deferred.   Liver cirrhosis of unclear etiology/hepatic cysts Etiology unclear but could be NASH. Gastroenterology is following. Hepatitis panel is negative. Ultrasound report reviewed. Liver cysts also noted. May need MRI at some point. She is hypoalbuminemic. PT/INR was elevated. INR normal 4/21.   Hypotension Blood pressure was low and has improved with midodrine. Metoprolol dose was decreased with holding parameters. Continue to monitor closely.  Acute on chronic kidney disease Based on care everywhere, her BUN and creatinine was normal in July 2016. Worsening in creatinine noted early April. So all of this appears to be acute renal  failure. Now that she is suspected of having liver cirrhosis, renal failure, could be suggestive of hepatorenal syndrome. Nephrology is following. Urine output remains poor. Nephrology would like to continue current management. They are hopeful that her renal function and urine output will improve as her blood pressure  improves. At the same time. They have given a guarded prognosis. They have discussed receding with more palliative approach with the family, however, family would like additional testing.  New onset atrial fibrillation Cardiology is following. Echocardiogram is as above. TSH is 3.12. CHADs2vasc score is 3. Patient started on intravenous heparin.  Recent Pneumonia Diagnosed at an outside facility. She has completed treatment. No clear evidence for infectious process at this time. She does have an elevated WBC, but she is afebrile. Cultures negative so far. Antibiotics were discontinued 4/20. Sepsis appears to have been ruled out.  Mildly elevated troponin Likely demand ischemia. Cardiology is following.  Essential hypertension See above.  DVT Prophylaxis: On IV heparin    Code Status: Full Code  Family Communication: Discussed with patient and her husband and son. They're frustrated, as there is no clear diagnosis for the patient's condition.  Disposition Plan: Management as outlined above. Mobilize as tolerated.     LOS: 5 days   [facility] Pager [phone] 11/17/2015, 9:01 AM  If 7PM-7AM, please contact night-coverage at [email or web address], password [facility]

## 2015-11-17 NOTE — Progress Notes (Signed)
ANTICOAGULATION CONSULT NOTE - Follow Up Consult  Pharmacy Consult:  Heparin Indication: atrial fibrillation  Allergies  Allergen Reactions  . Sulfa Antibiotics Hives    Patient Measurements: Height: 5\' 3"  (160 cm) Weight: 269 lb 11.2 oz (122.335 kg) IBW/kg (Calculated) : 52.4 Heparin Dosing Weight: 82 kg  Vital Signs: Temp: 97.6 F (36.4 C) (04/22 0800) Temp Source: Oral (04/22 0800) BP: 116/72 mmHg (04/22 0800) Pulse Rate: 88 (04/22 0800)  Labs:  Recent Labs  11/15/15 0346 11/16/15 0359 11/17/15 0218 11/17/15 1010  HGB 11.7* 12.0 12.1  --   HCT 36.1 36.8 36.3  --   PLT 223 229 236  --   APTT  --  31  --   --   LABPROT  --  15.5*  --   --   INR  --  1.22  --   --   HEPARINUNFRC 0.55  --  0.33 0.40  CREATININE 2.73* 2.87* 2.84*  --     Estimated Creatinine Clearance: 20.4 mL/min (by C-G formula based on Cr of 2.84).   Assessment: 31 YOF with new-onset AFib.  HL has been therapeutic x 2 (0.33 and 0.40) on 1050 units/hr, CBC stable. No issues per RN.  Goal of Therapy:  Heparin level 0.3-0.7 units/ml Monitor platelets by anticoagulation protocol: Yes    Plan:  - Continue heparin infusion at 1050 units/hr - Daily HL, CBC - Monitor s/s of bleeding  Cassie L. Nicole Kindred, PharmD PGY2 Infectious Diseases Pharmacy Resident Pager: [phone] 11/17/2015 11:31 AM

## 2015-11-18 ENCOUNTER — Inpatient Hospital Stay (HOSPITAL_COMMUNITY): Payer: Medicare Other

## 2015-11-18 DIAGNOSIS — K769 Liver disease, unspecified: Secondary | ICD-10-CM | POA: Insufficient documentation

## 2015-11-18 LAB — CBC
HEMATOCRIT: 37.3 % (ref 36.0–46.0)
Hemoglobin: 12.2 g/dL (ref 12.0–15.0)
MCH: 30.4 pg (ref 26.0–34.0)
MCHC: 32.7 g/dL (ref 30.0–36.0)
MCV: 93 fL (ref 78.0–100.0)
PLATELETS: 244 10*3/uL (ref 150–400)
RBC: 4.01 MIL/uL (ref 3.87–5.11)
RDW: 15.1 % (ref 11.5–15.5)
WBC: 17.6 10*3/uL — ABNORMAL HIGH (ref 4.0–10.5)

## 2015-11-18 LAB — BASIC METABOLIC PANEL
ANION GAP: 18 — AB (ref 5–15)
BUN: 84 mg/dL — ABNORMAL HIGH (ref 6–20)
CALCIUM: 8.5 mg/dL — AB (ref 8.9–10.3)
CHLORIDE: 97 mmol/L — AB (ref 101–111)
CO2: 21 mmol/L — ABNORMAL LOW (ref 22–32)
CREATININE: 3.09 mg/dL — AB (ref 0.44–1.00)
GFR calc non Af Amer: 13 mL/min — ABNORMAL LOW (ref >60)
GFR, EST AFRICAN AMERICAN: 15 mL/min — AB (ref >60)
Glucose, Bld: 109 mg/dL — ABNORMAL HIGH (ref 65–99)
Potassium: 4.1 mmol/L (ref 3.5–5.1)
SODIUM: 136 mmol/L (ref 135–145)

## 2015-11-18 LAB — PROTEIN / CREATININE RATIO, URINE
Creatinine, Urine: 170.47 mg/dL
Protein Creatinine Ratio: 0.18 mg/mg{Cre} — ABNORMAL HIGH (ref 0.00–0.15)
Total Protein, Urine: 30 mg/dL

## 2015-11-18 LAB — HEPARIN LEVEL (UNFRACTIONATED): Heparin Unfractionated: 0.56 IU/mL (ref 0.30–0.70)

## 2015-11-18 MED ORDER — PROCHLORPERAZINE EDISYLATE 5 MG/ML IJ SOLN
5.0000 mg | INTRAMUSCULAR | Status: DC | PRN
  Administered 2015-11-18 – 2015-11-22 (×3): 5 mg via INTRAVENOUS
  Filled 2015-11-18 (×6): qty 1

## 2015-11-18 NOTE — Progress Notes (Signed)
Patient Name: Kendra Martinez Date of Encounter: 11/18/2015  Principal Problem:   Acute on chronic diastolic CHF (congestive heart failure), NYHA class 4 (HCC) Active Problems:   Pleural effusion   Essential hypertension   Hyponatremia   Sepsis ([place])   Anasarca   New onset a-fib (HCC)   Elevated troponin   Acute respiratory failure with hypoxemia (HCC)   Ascites   ARF (acute renal failure) (HCC)   Cirrhosis of liver with ascites (HCC)   Arterial hypotension   Length of Stay: 6  SUBJECTIVE  Lots of nausea and vomiting (she thinks it is due to drinking OJ and eating cantaloupe), btter with compazine.  CURRENT MEDS . feeding supplement  1 Container Oral BID BM  . fluconazole (DIFLUCAN) IV  100 mg Intravenous Q24H  . magic mouthwash  5 mL Oral TID  . metoprolol tartrate  12.5 mg Oral BID  . midodrine  5 mg Oral TID WC  . mometasone-formoterol  2 puff Inhalation BID  . ondansetron (ZOFRAN) IV  4 mg Intravenous Once  . sertraline  25 mg Oral Daily  . sodium bicarbonate  650 mg Oral BID  . sodium chloride flush  3 mL Intravenous Q12H  . sodium chloride flush  3 mL Intravenous Q12H    OBJECTIVE   Intake/Output Summary (Last 24 hours) at 11/18/15 1007 Last data filed at 11/18/15 0925  Gross per 24 hour  Intake 698.33 ml  Output    352 ml  Net 346.33 ml   Filed Weights   11/16/15 0430 11/17/15 0400 11/18/15 0410  Weight: 122.834 kg (270 lb 12.8 oz) 122.335 kg (269 lb 11.2 oz) 122.199 kg (269 lb 6.4 oz)    PHYSICAL EXAM Filed Vitals:   11/18/15 0030 11/18/15 0410 11/18/15 0738 11/18/15 0855  BP: 122/84 103/73 106/77   Pulse: 85 90 84   Temp: 98.2 F (36.8 C) 98.2 F (36.8 C) 97.2 F (36.2 C)   TempSrc:   Axillary   Resp: 19 20 19    Height:      Weight:  122.199 kg (269 lb 6.4 oz)    SpO2: 97% 100% 98% 97%   General: Alert, oriented x3, no distress, appears weak and ill Head: no evidence of trauma, PERRL, EOMI, no exophtalmos or lid lag, no myxedema, no  xanthelasma; normal ears, nose and oropharynx Neck: normal jugular venous pulsations and no hepatojugular reflux; brisk carotid pulses without delay and no carotid bruits Chest: clear to auscultation, no signs of consolidation by percussion or palpation, normal fremitus, symmetrical and full respiratory excursions Cardiovascular: normal position and quality of the apical impulse, irregular rhythm, normal first and second heart sounds, no rubs or gallops, no murmur Abdomen: ascites, no masses by palpation, no abnormal pulsatility or arterial bruits, normal bowel sounds, unable to palpate liver or spleen. Extremities: no clubbing, cyanosis; bilateral 3-4+ pitting calf and thigh edema; 2+ radial, ulnar and brachial pulses bilaterally; 2+ right femoral, posterior tibial and dorsalis pedis pulses; 2+ left femoral, posterior tibial and dorsalis pedis pulses; no subclavian or femoral bruits Neurological: grossly nonfocal  LABS  CBC  Recent Labs  11/17/15 0218 11/18/15 0606  WBC 18.0* 17.6*  HGB 12.1 12.2  HCT 36.3 37.3  MCV 92.8 93.0  PLT 236 [id number]   Basic Metabolic Panel  Recent Labs  11/17/15 0218 11/18/15 0606  NA 133* 136  K 4.3 4.1  CL 99* 97*  CO2 17* 21*  GLUCOSE 108* 109*  BUN 76* 84*  CREATININE 2.84* 3.09*  CALCIUM 8.2* 8.5*   Liver Function Tests  Recent Labs  11/16/15 0359  AST 21  ALT 15  ALKPHOS 56  BILITOT 1.0  PROT 5.3*  5.2*  ALBUMIN 2.0*    Radiology Studies Imaging results have been reviewed and Dg Chest Port 1 View  11/16/2015  CLINICAL DATA:  Status post right thoracentesis. EXAM: PORTABLE CHEST 1 VIEW COMPARISON:  11/14/2015 FINDINGS: Normal heart size. There is scratch set there has been interval decrease in volume of the right pleural effusion. No pneumothorax identified. Asymmetric elevation of left hemidiaphragm is again noted. IMPRESSION: 1. Decrease in volume of right pleural effusion following thoracentesis. No pneumothorax. Electronically  Signed   By: Kerby Moors M.D.   On: 11/16/2015 14:09    TELE AF, rate controlled   ASSESSMENT AND PLAN   1. Massive fluid overload likely combination of acute on chronic diastolic heart failure, liver cirrhosis, acute renal failure and hypoalbuminemia - absence of elevated JVP suggests liver disease may be a big contributor, but her physical exam is challenging due to obesity - treatment limited by renal failure. Oliguric renal failure, slowly worsening today - unlikely to improve without diuresis and/or paracentesis, both of which would lead to worsening renal failure - consider albumin + lasix? - unable to undergo cardiac cath - if not a dialysis candidate, prognosis is grim  2. Newly diagnosed atrial fibrillation - CHA2DS2-Vasc score 3 (HF, female, HTN) - IV heparin - unlikely to maintain NSR after CV and anticoagulation may still need to be interrupted in near future, so would no go down that route  3. R pleural effusion s/p thoracentesis  4. Large ascites: may need paracentesis  5. Recent Klebsiella pneumonia UTI treated at [facility]  6. Persistently elevated WBC   7. Hypotension improved on midodrine   Sanda Klein, MD, [facility] [phone] office [phone] pager 11/18/2015 10:07 AM

## 2015-11-18 NOTE — Progress Notes (Addendum)
   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 On [place] here - CT w/ contrast in [place] 08/2015 ? Peripheral liver cystic lesions vs perihepatic fluid 3) Renal failure/AKI - cause not clear last creat [facility] 4/8 was 2.04 and was 0.82 01/2015  In order to understand things better will try unenhanced CT - I do not think she could hold her breath well for am MR  Will also order dx paracentesis  My team will f/u tomorrow Family and patient aware and understand plan  Gatha Mayer, MD, [facility] [phone] (pager) [phone] after 5 PM, weekends and holidays  11/18/2015 1:47 PM    Subjective  [place] renal fx Family would like more definitive dx than we have been able to give so far but they understand limits facing dx w/u She is mildly dyspneic, c/o nausea   Objective  BP 122/80 mmHg  Pulse 76  Temp(Src) 97.9 F (36.6 C) (Axillary)  Resp 19  Ht 5\' 3"  (1.6 m)  Wt 269 lb 6.4 oz (122.199 kg)  BMI 47.73 kg/m2  SpO2 100% Chronically ill  I/O last 3 completed shifts: In: 1241.4 [P.O.:300; I.V.:791.4; IV Piggyback:150] Out: 652 [Urine:650; Stool:2] Total I/O In: 160.5 [P.O.:60; I.V.:100.5] Out: 150 [Urine:150]  Lab Results  Component Value Date   CREATININE 3.09* 11/18/2015   BUN 84* 11/18/2015   NA 136 11/18/2015   K 4.1 11/18/2015   CL 97* 11/18/2015   CO2 21* 11/18/2015

## 2015-11-18 NOTE — Progress Notes (Signed)
[facility] ROUNDING NOTE   Subjective:   Interval History: continues with nausea that was refractory to zofran, compazine given last night that helped  Objective:  Vital signs in last 24 hours:  Temp:  [97.2 F (36.2 C)-98.2 F (36.8 C)] 97.2 F (36.2 C) (04/23 0738) Pulse Rate:  [80-90] 84 (04/23 0738) Resp:  [19-20] 19 (04/23 0738) BP: (103-141)/(73-89) 106/77 mmHg (04/23 0738) SpO2:  [97 %-100 %] 97 % (04/23 0855) Weight:  [122.199 kg (269 lb 6.4 oz)] 122.199 kg (269 lb 6.4 oz) (04/23 0410)  Weight change: -0.136 kg (-4.8 oz) Filed Weights   11/16/15 0430 11/17/15 0400 11/18/15 0410  Weight: 122.834 kg (270 lb 12.8 oz) 122.335 kg (269 lb 11.2 oz) 122.199 kg (269 lb 6.4 oz)    Intake/Output: I/O last 3 completed shifts: In: 1241.4 [P.O.:300; I.V.:791.4; IV Piggyback:150] Out: 502 [Urine:500; Stool:2]   Intake/Output this shift:  Total I/O In: 160.5 [P.O.:60; I.V.:100.5] Out: -   CVS- RRR RS- CTA diminished at base  ABD- distended with distant bowel sounds  EXT-  2 + edema    Basic Metabolic Panel:  Recent Labs Lab 11/13/15 0440 11/14/15 0622 11/15/15 0346 11/16/15 0359 11/17/15 0218 11/18/15 0606  NA 132* 133* 136 137 133* 136  K 4.1 4.4 3.9 4.1 4.3 4.1  CL 96* 94* 97* 99* 99* 97*  CO2 19* 21* 21* 22 17* 21*  GLUCOSE 116* 109* 103* 103* 108* 109*  BUN 52* 63* 65* 70* 76* 84*  CREATININE 2.47* 2.74* 2.73* 2.87* 2.84* 3.09*  CALCIUM 8.0* 8.1* 8.0* 8.2* 8.2* 8.5*  MG 1.6*  --   --   --   --   --   PHOS 3.9  --   --   --   --   --     Liver Function Tests:  Recent Labs Lab 11/12/15 2330 11/13/15 0440 11/16/15 0359  AST 21 25 21   ALT 15 18 15   ALKPHOS 55 59 56  BILITOT 1.1 1.4* 1.0  PROT 5.2* 5.3* 5.3*  5.2*  ALBUMIN 2.2* 2.3* 2.0*   No results for input(s): LIPASE, AMYLASE in the last 168 hours. No results for input(s): AMMONIA in the last 168 hours.  CBC:  Recent Labs Lab 11/12/15 1619  11/14/15 0622 11/15/15 0346  11/16/15 0359 11/17/15 0218 11/18/15 0606  WBC 22.3*  < > 18.5* 18.8* 15.6* 18.0* 17.6*  NEUTROABS 19.9*  --   --   --   --   --   --   HGB 13.0  < > 12.1 11.7* 12.0 12.1 12.2  HCT 39.2  < > 37.1 36.1 36.8 36.3 37.3  MCV 92.7  < > 93.0 92.3 92.5 92.8 93.0  PLT 219  < > 211 223 229 236 244  < > = values in this interval not displayed.  Cardiac Enzymes:  Recent Labs Lab 11/12/15 2330 11/13/15 0440 11/13/15 1120  TROPONINI 0.11* 0.08* 0.07*    BNP: Invalid input(s): POCBNP  CBG: No results for input(s): GLUCAP in the last 168 hours.  Microbiology: Results for orders placed or performed during the hospital encounter of 11/12/15  Blood culture (routine x 2)     Status: None   Collection Time: 11/12/15  8:20 PM  Result Value Ref Range Status   Specimen Description BLOOD RIGHT ANTECUBITAL  Final   Special Requests IN PEDIATRIC BOTTLE 3CC  Final   Culture NO GROWTH 5 DAYS  Final   Report Status 11/17/2015 FINAL  Final  Blood culture (routine x 2)     Status: None   Collection Time: 11/12/15  8:25 PM  Result Value Ref Range Status   Specimen Description BLOOD RIGHT HAND  Final   Special Requests IN PEDIATRIC BOTTLE 3CC  Final   Culture NO GROWTH 5 DAYS  Final   Report Status 11/17/2015 FINAL  Final  Urine culture     Status: Abnormal   Collection Time: 11/13/15 11:32 AM  Result Value Ref Range Status   Specimen Description URINE, CLEAN CATCH  Final   Special Requests NONE  Final   Culture 60,000 COLONIES/mL YEAST (A)  Final   Report Status 11/14/2015 FINAL  Final  MRSA PCR Screening     Status: None   Collection Time: 11/13/15  5:21 PM  Result Value Ref Range Status   MRSA by PCR NEGATIVE NEGATIVE Final    Comment:        The GeneXpert MRSA Assay (FDA approved for NASAL specimens only), is one component of a comprehensive MRSA colonization surveillance program. It is not intended to diagnose MRSA infection nor to guide or monitor treatment for MRSA infections.    Culture, body fluid-bottle     Status: None (Preliminary result)   Collection Time: 11/16/15 12:40 PM  Result Value Ref Range Status   Specimen Description FLUID RIGHT PLEURAL  Final   Special Requests BOTTLES DRAWN AEROBIC AND ANAEROBIC 3CC  Final   Culture NO GROWTH < 24 HOURS  Final   Report Status PENDING  Incomplete  Gram stain     Status: None   Collection Time: 11/16/15 12:40 PM  Result Value Ref Range Status   Specimen Description FLUID RIGHT PLEURAL  Final   Special Requests NONE  Final   Gram Stain   Final    ABUNDANT WBC PRESENT,BOTH PMN AND MONONUCLEAR NO ORGANISMS SEEN    Report Status 11/16/2015 FINAL  Final    Coagulation Studies:  Recent Labs  11/16/15 0359  LABPROT 15.5*  INR 1.22    Urinalysis: No results for input(s): COLORURINE, LABSPEC, PHURINE, GLUCOSEU, HGBUR, BILIRUBINUR, KETONESUR, PROTEINUR, UROBILINOGEN, NITRITE, LEUKOCYTESUR in the last 72 hours.  Invalid input(s): APPERANCEUR    Imaging: Dg Chest Port 1 View  11/16/2015  CLINICAL DATA:  Status post right thoracentesis. EXAM: PORTABLE CHEST 1 VIEW COMPARISON:  11/14/2015 FINDINGS: Normal heart size. There is scratch set there has been interval decrease in volume of the right pleural effusion. No pneumothorax identified. Asymmetric elevation of left hemidiaphragm is again noted. IMPRESSION: 1. Decrease in volume of right pleural effusion following thoracentesis. No pneumothorax. Electronically Signed   By: Kerby Moors M.D.   On: 11/16/2015 14:09     Medications:   . heparin 1,050 Units/hr (11/17/15 1741)   . feeding supplement  1 Container Oral BID BM  . fluconazole (DIFLUCAN) IV  100 mg Intravenous Q24H  . magic mouthwash  5 mL Oral TID  . metoprolol tartrate  12.5 mg Oral BID  . midodrine  5 mg Oral TID WC  . mometasone-formoterol  2 puff Inhalation BID  . ondansetron (ZOFRAN) IV  4 mg Intravenous Once  . sertraline  25 mg Oral Daily  . sodium bicarbonate  650 mg Oral BID  .  sodium chloride flush  3 mL Intravenous Q12H  . sodium chloride flush  3 mL Intravenous Q12H   sodium chloride, acetaminophen **OR** acetaminophen, ALPRAZolam, HYDROcodone-acetaminophen, ondansetron **OR** ondansetron (ZOFRAN) IV, prochlorperazine, sodium chloride flush, traMADol  Assessment/ Plan:  1. AoCKD: Likely  hepatorenal syndrome vs. ARB use. FENa <0.1%, BUN/Cr >20:1, urine osmolality 501 making prerenal injury more likely than ATN.Hepatorenal and decreased renal perfusion are in differential  Cr stable 3.08  today. Decreased UOP noted BP stable. On Metoprolol. Avoid nephrotixic meds (ACE/ ARB/ NSAID/ Contrast).  2. Hyponatremia: Na 136 this morning. Will monitor. 3. Dyspnea: CXR w/ midlung opacity. Was on vanc/zosyn. DCd by primary team. S/p Thoracentesis 4/21, only about 500cc off. LDH elevated 139, <3.0 protein, Fluid WBC 1592, no organisms on gram stain, Fluid cx NGTD. Cytology still pending. Inflammatory disorders being evaluated  4. AoCHF: Echo EF45-50%. Manage per cards 5. Cirrhosis w/ Ascites: US abdomen w/ moderate fluid and benign appearing liver cysts x2. Would hold on MRI contrast unless absolutely necessary. Manage per primary.   Dispo: Discussion continued with family. Gerald Stabs daughter in room  Again, noted that QOL likely poor on HD and longevity not necessarily increased on HD.Some of her symptoms could conceivably be due to uremia. Family continues to seek unifying diagnosis as to why patient is having multiorgan failure and asking for further testing. Will order ANA and anti-GBM ANCA and SPEP UPEP although this is likely to be unrevealing Hepatitis panel already performed this hospitalization and was negative. Though think that patient's GOC should really be the focus in the next few days. Renal biopsy could be considered although Urine sodium less than 10 and inactive urine sediment. Discussed with Dr Maryland Pink this morning-- we shall await the results of testing this week  and will consider trial of dialysis and renal biopsy if all else is unrevealing    LOS: 6 Aviyah Swetz W @TODAY @11 :43 AM

## 2015-11-18 NOTE — Progress Notes (Signed)
[facility] PROGRESS NOTE  Kendra Martinez [id number] DOB: Oct 13, 1933 DOA: 11/12/2015  PCP: She has recently relocated to this area. Does not have any providers here.  Brief HPI: 82 year old Caucasian female with a past medical history of hypertension, diastolic CHF, recently treated pneumonia at an outside facility, presented with was needing shortness of breath. Patient is new to [place]. She has recently relocated to this area. Does not see any providers here.  Past medical history:  Past Medical History  Diagnosis Date  . CHF (congestive heart failure) ([place])   . Hypertension   . Pleural effusion 10/2015  . Bipolar disorder ([place])   . GERD (gastroesophageal reflux disease)   . Ascites   . Arthritis     oa in knees    Consultants: Cardiology. Pulmonology. Nephrology. Gastroenterology  Procedures:  Transthoracic echocardiogram Study Conclusions - Left ventricle: The cavity size was normal. There was mild focal basal hypertrophy of the septum. Systolic function was mildly reduced. The estimated ejection fraction was in the range of 45% to 50%. Wall motion was normal; there were no regional wall motion abnormalities. - Aortic valve: Trileaflet; mildly thickened, mildly calcified leaflets. - Mitral valve: Calcified annulus. There was trivial regurgitation.  Thoracentesis 4/21  Antibiotics: Vancomycin and Zosyn 4/17-->4/20  Subjective: Patient was very nauseated overnight. Had an episode of vomiting this morning. Feels better after she was given antiemetics. Complains of some discomfort in the left lower quadrant. Otherwise, no other changes. Her husband and daughter are at bedside.   Objective:   Vital Signs  Filed Vitals:   11/18/15 0030 11/18/15 0410 11/18/15 0738 11/18/15 0855  BP: 122/84 103/73 106/77   Pulse: 85 90 84   Temp: 98.2 F (36.8 C) 98.2 F (36.8 C) 97.2 F (36.2 C)   TempSrc:   Axillary   Resp: 19 20 19    Height:      Weight:  122.199 kg  (269 lb 6.4 oz)    SpO2: 97% 100% 98% 97%    Intake/Output Summary (Last 24 hours) at 11/18/15 0904 Last data filed at 11/18/15 0759  Gross per 24 hour  Intake 638.33 ml  Output    352 ml  Net 286.33 ml   Filed Weights   11/16/15 0430 11/17/15 0400 11/18/15 0410  Weight: 122.834 kg (270 lb 12.8 oz) 122.335 kg (269 lb 11.2 oz) 122.199 kg (269 lb 6.4 oz)    General appearance: alert, cooperative, Fatigued. Resp: Improved air entry on the right. No definite crackles. No wheezing or rhonchi. Cardio: regular rate and rhythm, S1, S2 normal, no murmur, click, rub or gallop GI: Abdomen is soft. Slightly tender in the left lower quadrant without any rebound, rigidity or guarding. No masses or organomegaly. Some ascites. Extremities: Bilateral pedal edema is present. Seems to be better. Neurologic: Alert and oriented X 3. No focal deficits.  Lab Results:  Data Reviewed: I have personally reviewed following labs and imaging studies  CBC:  Recent Labs Lab 11/12/15 1619  11/14/15 0622 11/15/15 0346 11/16/15 0359 11/17/15 0218 11/18/15 0606  WBC 22.3*  < > 18.5* 18.8* 15.6* 18.0* 17.6*  NEUTROABS 19.9*  --   --   --   --   --   --   HGB 13.0  < > 12.1 11.7* 12.0 12.1 12.2  HCT 39.2  < > 37.1 36.1 36.8 36.3 37.3  MCV 92.7  < > 93.0 92.3 92.5 92.8 93.0  PLT 219  < > 211 223 229 236 244  < > =  values in this interval not displayed. Basic Metabolic Panel:  Recent Labs Lab 11/13/15 0440 11/14/15 0622 11/15/15 0346 11/16/15 0359 11/17/15 0218 11/18/15 0606  NA 132* 133* 136 137 133* 136  K 4.1 4.4 3.9 4.1 4.3 4.1  CL 96* 94* 97* 99* 99* 97*  CO2 19* 21* 21* 22 17* 21*  GLUCOSE 116* 109* 103* 103* 108* 109*  BUN 52* 63* 65* 70* 76* 84*  CREATININE 2.47* 2.74* 2.73* 2.87* 2.84* 3.09*  CALCIUM 8.0* 8.1* 8.0* 8.2* 8.2* 8.5*  MG 1.6*  --   --   --   --   --   PHOS 3.9  --   --   --   --   --    GFR: Estimated Creatinine Clearance: 18.7 mL/min (by C-G formula based on Cr of  3.09). Liver Function Tests:  Recent Labs Lab 11/12/15 2330 11/13/15 0440 11/16/15 0359  AST 21 25 21   ALT 15 18 15   ALKPHOS 55 59 56  BILITOT 1.1 1.4* 1.0  PROT 5.2* 5.3* 5.3*  5.2*  ALBUMIN 2.2* 2.3* 2.0*   Coagulation Profile:  Recent Labs Lab 11/13/15 0440 11/16/15 0359  INR 1.59* 1.22   Cardiac Enzymes:  Recent Labs Lab 11/12/15 2330 11/13/15 0440 11/13/15 1120  TROPONINI 0.11* 0.08* 0.07*   Urine analysis:    Component Value Date/Time   COLORURINE YELLOW 11/13/2015 1132   APPEARANCEUR CLOUDY* 11/13/2015 1132   LABSPEC 1.023 11/13/2015 1132   PHURINE 5.5 11/13/2015 1132   GLUCOSEU NEGATIVE 11/13/2015 1132   HGBUR NEGATIVE 11/13/2015 1132   BILIRUBINUR MODERATE* 11/13/2015 1132   [place] 11/13/2015 1132   PROTEINUR 30* 11/13/2015 1132   NITRITE NEGATIVE 11/13/2015 1132   LEUKOCYTESUR MODERATE* 11/13/2015 1132    Recent Results (from the past 240 hour(s))  Blood culture (routine x 2)     Status: None   Collection Time: 11/12/15  8:20 PM  Result Value Ref Range Status   Specimen Description BLOOD RIGHT ANTECUBITAL  Final   Special Requests IN PEDIATRIC BOTTLE 3CC  Final   Culture NO GROWTH 5 DAYS  Final   Report Status 11/17/2015 FINAL  Final  Blood culture (routine x 2)     Status: None   Collection Time: 11/12/15  8:25 PM  Result Value Ref Range Status   Specimen Description BLOOD RIGHT HAND  Final   Special Requests IN PEDIATRIC BOTTLE 3CC  Final   Culture NO GROWTH 5 DAYS  Final   Report Status 11/17/2015 FINAL  Final  Urine culture     Status: Abnormal   Collection Time: 11/13/15 11:32 AM  Result Value Ref Range Status   Specimen Description URINE, CLEAN CATCH  Final   Special Requests NONE  Final   Culture 60,000 COLONIES/mL YEAST (A)  Final   Report Status 11/14/2015 FINAL  Final  MRSA PCR Screening     Status: None   Collection Time: 11/13/15  5:21 PM  Result Value Ref Range Status   MRSA by PCR NEGATIVE NEGATIVE Final     Comment:        The GeneXpert MRSA Assay (FDA approved for NASAL specimens only), is one component of a comprehensive MRSA colonization surveillance program. It is not intended to diagnose MRSA infection nor to guide or monitor treatment for MRSA infections.   Culture, body fluid-bottle     Status: None (Preliminary result)   Collection Time: 11/16/15 12:40 PM  Result Value Ref Range Status   Specimen Description FLUID RIGHT  PLEURAL  Final   Special Requests BOTTLES DRAWN AEROBIC AND ANAEROBIC 3CC  Final   Culture NO GROWTH < 24 HOURS  Final   Report Status PENDING  Incomplete  Gram stain     Status: None   Collection Time: 11/16/15 12:40 PM  Result Value Ref Range Status   Specimen Description FLUID RIGHT PLEURAL  Final   Special Requests NONE  Final   Gram Stain   Final    ABUNDANT WBC PRESENT,BOTH PMN AND MONONUCLEAR NO ORGANISMS SEEN    Report Status 11/16/2015 FINAL  Final      Radiology Studies: Dg Chest Port 1 View  11/16/2015  CLINICAL DATA:  Status post right thoracentesis. EXAM: PORTABLE CHEST 1 VIEW COMPARISON:  11/14/2015 FINDINGS: Normal heart size. There is scratch set there has been interval decrease in volume of the right pleural effusion. No pneumothorax identified. Asymmetric elevation of left hemidiaphragm is again noted. IMPRESSION: 1. Decrease in volume of right pleural effusion following thoracentesis. No pneumothorax. Electronically Signed   By: Kerby Moors M.D.   On: 11/16/2015 14:09     Medications:  Scheduled: . feeding supplement  1 Container Oral BID BM  . fluconazole (DIFLUCAN) IV  100 mg Intravenous Q24H  . magic mouthwash  5 mL Oral TID  . metoprolol tartrate  12.5 mg Oral BID  . midodrine  5 mg Oral TID WC  . mometasone-formoterol  2 puff Inhalation BID  . ondansetron (ZOFRAN) IV  4 mg Intravenous Once  . sertraline  25 mg Oral Daily  . sodium bicarbonate  650 mg Oral BID  . sodium chloride flush  3 mL Intravenous Q12H  . sodium  chloride flush  3 mL Intravenous Q12H   Continuous: . heparin 1,050 Units/hr (11/17/15 1741)   [id number] chloride, acetaminophen **OR** acetaminophen, ALPRAZolam, HYDROcodone-acetaminophen, ondansetron **OR** ondansetron (ZOFRAN) IV, prochlorperazine, sodium chloride flush, traMADol  Assessment/Plan:  Principal Problem:   Acute on chronic diastolic CHF (congestive heart failure), NYHA class 4 (HCC) Active Problems:   Pleural effusion   Essential hypertension   Hyponatremia   Sepsis ([place])   Anasarca   New onset a-fib (HCC)   Elevated troponin   Acute respiratory failure with hypoxemia (HCC)   Ascites   ARF (acute renal failure) (HCC)   Cirrhosis of liver with ascites (HCC)   Arterial hypotension    Acute on chronic kidney disease Based on care everywhere, her BUN and creatinine was normal in July 2016. Worsening in creatinine noted early April. So this appears to be acute renal failure. Now that she is suspected of having liver cirrhosis, renal failure could be suggestive of hepatorenal syndrome. Nephrology is following. Urine output remains poor. Discussed with Dr. Justin Mend this morning. Further testing to be ordered. May have to consider renal biopsy if family is keen on aggressive diagnostic modalities. Nephrology has also discussed with family regarding possibility of dialysis.  Liver cirrhosis of unclear etiology/hepatic cysts Etiology unclear but could be NASH. Gastroenterology is following. Hepatitis panel is negative. Ultrasound report reviewed. Liver cysts also noted. May need MRI at some point. She is hypoalbuminemic. PT/INR was elevated. INR normal 4/21. Await further gastroenterology input regarding any further testing to be considered.  Right-sided pleural effusion Etiology is unclear but probably transudative secondary to liver cirrhosis or heart failure. Patient was seen by pulmonology. Patient underwent thoracentesis 4/21. LDH in the fluid was 139. Total protein less  than 3. This is suggestive of a transudative process. Cultures are pending. Most likely due  to her liver process. CT scan of the chest did not show any infiltrates. Echocardiogram only showed minimally depressed ejection fraction. PA pressure is noted to be normal.   Anasarca with ascites, pleural effusion,  According to patient reports she could've gained approximately 40-50 pounds or even more over the last many months. Reason for her presentation is not entirely clear, but studies done so far suggests liver cirrhosis could be the main reason. Echocardiogram shows EF of 45-50%. PA pressures are normal. Ultrasound did show evidence for liver cirrhosis. She has hypoalbuminemia. Her PT/INR was slightly elevated. Patient denies any history of liver disease, hepatitis. Hepatitis panel is negative. Lasix was discontinued due to worsening creatinine. Initially paracentesis was considered. However, due to renal failure, this was deferred.   Nausea and vomiting Most probably due to renal failure and uremia. Abdomen is benign. For the most part. She patient did have a CT scan of her abdomen and pelvis in February when she was hospitalized in another facility. Could consider doing plain x-rays to make sure there is no acute finding.  Hypotension Blood pressure was low and has improved with midodrine. Metoprolol dose was decreased with holding parameters. Continue to monitor closely.  New onset atrial fibrillation Cardiology is following. Echocardiogram is as above. TSH is 3.12. CHADs2vasc score is 3. Patient started on intravenous heparin. Heart rate is reasonably well controlled. Continue metoprolol.  Recent Pneumonia in early April Diagnosed at an outside facility. She has completed treatment. No clear evidence for infectious process at this time. She does have an elevated WBC, but she is afebrile. Cultures negative so far. Antibiotics were discontinued 4/20. Sepsis appears to have been ruled out.  Mildly  elevated troponin Likely demand ischemia. Cardiology is following.  Essential hypertension See above.  DVT Prophylaxis: On IV heparin    Code Status: Full Code  Family Communication: Discussed with patient and her husband and daughter. They're frustrated, as there is no clear diagnosis for the patient's condition. They want to hold off on any discussions regarding goals of care until we are more certain as to the diagnosis. Disposition Plan: Management as outlined above. Mobilize as tolerated.     LOS: 6 days   [facility] Pager [phone] 11/18/2015, 9:04 AM  If 7PM-7AM, please contact night-coverage at [email or web address], password [facility]

## 2015-11-18 NOTE — Progress Notes (Signed)
ANTICOAGULATION CONSULT NOTE - Follow Up Consult  Pharmacy Consult:  Heparin Indication: atrial fibrillation  Allergies  Allergen Reactions  . Sulfa Antibiotics Hives    Patient Measurements: Height: 5\' 3"  (160 cm) Weight: 269 lb 6.4 oz (122.199 kg) IBW/kg (Calculated) : 52.4 Heparin Dosing Weight: 82 kg  Vital Signs: Temp: 97.2 F (36.2 C) (04/23 0738) Temp Source: Axillary (04/23 0738) BP: 106/77 mmHg (04/23 0738) Pulse Rate: 84 (04/23 0738)  Labs:  Recent Labs  11/16/15 0359 11/17/15 0218 11/17/15 1010 11/18/15 0606  HGB 12.0 12.1  --  12.2  HCT 36.8 36.3  --  37.3  PLT 229 236  --  244  APTT 31  --   --   --   LABPROT 15.5*  --   --   --   INR 1.22  --   --   --   HEPARINUNFRC  --  0.33 0.40 0.56  CREATININE 2.87* 2.84*  --  3.09*    Estimated Creatinine Clearance: 18.7 mL/min (by C-G formula based on Cr of 3.09).   Assessment: 65 YOF with new-onset AFib.  HL has been therapeutic x 3 (0.33, 0.40, and 0.56) on 1050 units/hr, CBC stable. No issues per RN.  Goal of Therapy:  Heparin level 0.3-0.7 units/ml Monitor platelets by anticoagulation protocol: Yes    Plan:  - Continue heparin infusion at 1050 units/hr - Daily HL, CBC - Monitor s/s of bleeding  Benedetto Ryder L. Nicole Kindred, PharmD PGY2 Infectious Diseases Pharmacy Resident Pager: [phone] 11/18/2015 10:53 AM

## 2015-11-19 ENCOUNTER — Inpatient Hospital Stay (HOSPITAL_COMMUNITY): Payer: Medicare Other

## 2015-11-19 DIAGNOSIS — J918 Pleural effusion in other conditions classified elsewhere: Secondary | ICD-10-CM

## 2015-11-19 DIAGNOSIS — K7581 Nonalcoholic steatohepatitis (NASH): Secondary | ICD-10-CM

## 2015-11-19 DIAGNOSIS — J189 Pneumonia, unspecified organism: Secondary | ICD-10-CM | POA: Insufficient documentation

## 2015-11-19 DIAGNOSIS — G9340 Encephalopathy, unspecified: Secondary | ICD-10-CM | POA: Insufficient documentation

## 2015-11-19 DIAGNOSIS — J948 Other specified pleural conditions: Secondary | ICD-10-CM

## 2015-11-19 DIAGNOSIS — I951 Orthostatic hypotension: Secondary | ICD-10-CM

## 2015-11-19 DIAGNOSIS — K7689 Other specified diseases of liver: Secondary | ICD-10-CM

## 2015-11-19 DIAGNOSIS — K767 Hepatorenal syndrome: Principal | ICD-10-CM

## 2015-11-19 DIAGNOSIS — D72829 Elevated white blood cell count, unspecified: Secondary | ICD-10-CM

## 2015-11-19 LAB — BODY FLUID CELL COUNT WITH DIFFERENTIAL
LYMPHS FL: 58 %
MONOCYTE-MACROPHAGE-SEROUS FLUID: 24 % — AB (ref 50–90)
Neutrophil Count, Fluid: 18 % (ref 0–25)
WBC FLUID: 386 uL (ref 0–1000)

## 2015-11-19 LAB — BASIC METABOLIC PANEL
Anion gap: 15 (ref 5–15)
BUN: 92 mg/dL — ABNORMAL HIGH (ref 6–20)
CHLORIDE: 98 mmol/L — AB (ref 101–111)
CO2: 23 mmol/L (ref 22–32)
CREATININE: 3.44 mg/dL — AB (ref 0.44–1.00)
Calcium: 8.4 mg/dL — ABNORMAL LOW (ref 8.9–10.3)
GFR, EST AFRICAN AMERICAN: 14 mL/min — AB (ref >60)
GFR, EST NON AFRICAN AMERICAN: 12 mL/min — AB (ref >60)
Glucose, Bld: 103 mg/dL — ABNORMAL HIGH (ref 65–99)
POTASSIUM: 4.3 mmol/L (ref 3.5–5.1)
SODIUM: 136 mmol/L (ref 135–145)

## 2015-11-19 LAB — CBC
HCT: 37.3 % (ref 36.0–46.0)
Hemoglobin: 12.1 g/dL (ref 12.0–15.0)
MCH: 30.3 pg (ref 26.0–34.0)
MCHC: 32.4 g/dL (ref 30.0–36.0)
MCV: 93.3 fL (ref 78.0–100.0)
Platelets: 229 10*3/uL (ref 150–400)
RBC: 4 MIL/uL (ref 3.87–5.11)
RDW: 15.2 % (ref 11.5–15.5)
WBC: 16.4 10*3/uL — AB (ref 4.0–10.5)

## 2015-11-19 LAB — AMMONIA: AMMONIA: 33 umol/L (ref 9–35)

## 2015-11-19 LAB — PROTEIN, BODY FLUID

## 2015-11-19 LAB — ALBUMIN, FLUID (OTHER): ALBUMIN FL: 1.6 g/dL

## 2015-11-19 LAB — GRAM STAIN

## 2015-11-19 LAB — ANTINUCLEAR ANTIBODIES, IFA: ANA Ab, IFA: NEGATIVE

## 2015-11-19 LAB — HEPARIN LEVEL (UNFRACTIONATED): HEPARIN UNFRACTIONATED: 0.6 [IU]/mL (ref 0.30–0.70)

## 2015-11-19 LAB — GLOMERULAR BASEMENT MEMBRANE ANTIBODIES: GBM Ab: 2 units (ref 0–20)

## 2015-11-19 MED ORDER — OCTREOTIDE ACETATE 100 MCG/ML IJ SOLN
100.0000 ug | Freq: Two times a day (BID) | INTRAMUSCULAR | Status: DC
Start: 2015-11-19 — End: 2015-11-23
  Administered 2015-11-19 – 2015-11-22 (×5): 100 ug via SUBCUTANEOUS
  Filled 2015-11-19 (×11): qty 1

## 2015-11-19 MED ORDER — MIDODRINE HCL 5 MG PO TABS
10.0000 mg | ORAL_TABLET | Freq: Three times a day (TID) | ORAL | Status: DC
  Administered 2015-11-19 – 2015-11-21 (×6): 10 mg via ORAL
  Filled 2015-11-19 (×6): qty 2

## 2015-11-19 MED ORDER — LIDOCAINE HCL (PF) 1 % IJ SOLN
INTRAMUSCULAR | Status: AC
  Administered 2015-11-19: 13:00:00
  Filled 2015-11-19: qty 10

## 2015-11-19 NOTE — Care Management Important Message (Signed)
Important Message  Patient Details  Name: Kendra Martinez MRN: [id number] Date of Birth: Oct 20, 1933   Medicare Important Message Given:  Yes    Nathen May 11/19/2015, 12:24 PM

## 2015-11-19 NOTE — Procedures (Signed)
Successful US guided paracentesis from left anterior abdomen.  Yielded 250 ml of clear yllow fluid.  No immediate complications.  Pt tolerated well.   Specimen was sent for labs.  Judie Grieve Kacy Hegna PA-C 11/19/2015 2:43 PM

## 2015-11-19 NOTE — Progress Notes (Signed)
PULMONARY / CRITICAL CARE MEDICINE   Name: Kendra Martinez MRN: [id number] DOB: 09-03-1933    ADMISSION DATE:  11/12/2015 CONSULTATION DATE: 4/18  REFERRING MD:  [facility]  CHIEF COMPLAINT: SOB  HISTORY OF PRESENT ILLNESS:   82 yo wf, never smoker, just discharged from [facility] 4/8 after being treated for CHF, Kleb P pna, Klep P uti. She comes to [facility] (moved to [place]) with CC of SOB, no fever, chills or sweats, + white "slimey sputum"and abd pain. Evaluation reveals rt pleural effusion(no x rays to compare) ELEVATED WBC, new Afib. She was started on heparin and PCCM asked to evaluate rt effusion. No urgent need for thoracentesis in setting of CHF.  SUBJECTIVE:  Minimally responsive female, no events overnight.   VITAL SIGNS: BP 105/74 mmHg  Pulse 85  Temp(Src) 97.8 F (36.6 C) (Axillary)  Resp 21  Ht 5\' 3"  (1.6 m)  Wt 121.11 kg (267 lb)  BMI 47.31 kg/m2  SpO2 97%   INTAKE / OUTPUT:  Intake/Output Summary (Last 24 hours) at 11/19/15 1018 Last data filed at 11/19/15 0700  Gross per 24 hour  Intake 541.68 ml  Output    300 ml  Net 241.68 ml    PHYSICAL EXAMINATION: General: MOWF minimally responsive, airway protection is questionable. Neuro:  Intact, MAE x 4, lip speaks well HEENT:  No JVD, MMM Cardiovascular:  HSIR IR a fib  Lungs:  Decreased bs bases, no increased wob at rest , DOE reported Abdomen: obese,non tender + bs Musculoskeletal:  intact Skin:  Lower ext ++ edema  LABS:  BMET  Recent Labs Lab 11/17/15 0218 11/18/15 0606 11/19/15 0418  NA 133* 136 136  K 4.3 4.1 4.3  CL 99* 97* 98*  CO2 17* 21* 23  BUN 76* 84* 92*  CREATININE 2.84* 3.09* 3.44*  GLUCOSE 108* 109* 103*    Electrolytes  Recent Labs Lab 11/13/15 0440  11/17/15 0218 11/18/15 0606 11/19/15 0418  CALCIUM 8.0*  < > 8.2* 8.5* 8.4*  MG 1.6*  --   --   --   --   PHOS 3.9  --   --   --   --   < > = values in this interval not displayed.  CBC  Recent Labs Lab 11/17/15 0218  11/18/15 0606 11/19/15 0418  WBC 18.0* 17.6* 16.4*  HGB 12.1 12.2 12.1  HCT 36.3 37.3 37.3  PLT 236 244 229    Coag's  Recent Labs Lab 11/13/15 0440 11/16/15 0359  APTT 122* 31  INR 1.59* 1.22    Sepsis Markers  Recent Labs Lab 11/12/15 2340 11/13/15 0440 11/13/15 0734 11/15/15 0027  LATICACIDVEN 1.2 1.5 1.2  --   PROCALCITON  --  0.65  --  0.71    ABG  Recent Labs Lab 11/12/15 1549  PHART 7.412  PCO2ART 36.0  PO2ART 69.0*    Liver Enzymes  Recent Labs Lab 11/12/15 2330 11/13/15 0440 11/16/15 0359  AST 21 25 21   ALT 15 18 15   ALKPHOS 55 59 56  BILITOT 1.1 1.4* 1.0  ALBUMIN 2.2* 2.3* 2.0*    Cardiac Enzymes  Recent Labs Lab 11/12/15 2330 11/13/15 0440 11/13/15 1120  TROPONINI 0.11* 0.08* 0.07*    Glucose No results for input(s): GLUCAP in the last 168 hours.  Imaging Dg Abd Portable 2v  11/18/2015  CLINICAL DATA:  Abdominal pain EXAM: PORTABLE ABDOMEN - 2 VIEW COMPARISON:  11/12/2015 abdominal radiograph FINDINGS: Moderate gaseous distention of the stomach. No dilated small bowel loops or  air-fluid levels. Mild stool throughout the colon. No evidence of pneumatosis or pneumoperitoneum. Marked degenerative changes in the visualized thoracolumbar spine. Cholecystectomy clips are seen in the right upper quadrant of the abdomen. IMPRESSION: 1. Nonspecific moderate gaseous distention of the stomach . 2. No evidence of small or large bowel obstruction. Electronically Signed   By: Ilona Sorrel M.D.   On: 11/18/2015 14:12   STUDIES:   CULTURES: 4/18 bc>> neg 4/18 uc>> 60k orgs  ANTIBIOTICS: 4/18 diflucan>> 4/18 vanc>> 4/18 Zoysn>>  ASSESSMENT / PLAN:  Recent dx of asthma Recent dx 4/8 of Kleb P Pna Rt pleural effusion known from previous admit at [facility]  ->this is likely a transudate; but given recent PNA would be nice to prove.  Plan:   Wean O2 Pleural fluid noted, transudative by protein but LDH 0.7 so borderline  exudative, what is more impressive however is WBC count in the fluid more concerning for a parapneumonic effusion.  Acute on chronic diastolic HF w/ hypervolemia  New onset a fib Plan:  Cont diuresis as BP/BUN/creatinine allow, ?dialysis. Cont heparin; agree would hold on coumadin until procedures completed ?if liver and kidney biopsies are necessary. Rate control w/ lopressor   AKI vs CRI-->her creatinine was elevated on her admit at the last hospital at >2.5. Wonder if her renal dysfxn really may be the driving issue here, which would certainly explain her anasarca & total body Volume overload Scr remains elevated but has hit a plateau. Renal now following.  Plan:   Follow creatine  Careful with diuresis  Renal dose meds  Consider dc vanc   Anasarca, Ascites & effusion (as above) Plan Considering paracentesis at some point assuming renal fxn stabilizes, again; doubt there will be much in the way of symptomatic improvement w/ this  FTT and severe physical deconditioning.  Has had remarkable decline slowly over the last 4 months. Has gone from ambulatory w/ cane to essentially bed/chair bound since Christmas time.  Plan Will need extensive PT and dietary support.   Dx with recent Bellamy, Treated with levaquin. On V/Z per  IM WBC 18.9, procal 0.65 Plan:   V/z per IM team Agree w/ Nephro-->think we should dc vanc   DISCUSSION: MOWF admitted w/ dyspnea in setting of decompensated d heart failure; and probably progressive cardio-renal syndrome resulting in diffuse anasarca and volume overload. She has had progressive FTT since Dec 2016. She has had the right effusion for some time. From a pulmonary stand-point it is unlikely to be a source of significant symptom burden in light of all of her other issues.  All labs reviewed, I reviewed CT myself with a large either liver abscesses or cysts.  Fluid is exudative by light's criteria.  Likely parapneumonic effusion.  Will consult ID and  order a repeat CXR in AM.  Discussed with Dr. Johnnye Sima from [place].    I had a long discussion with the patient's husband, after discussion decided to make patient a full DNR.  Biopsies ok to do if not terribly invasive.  Dialysis they are considering right now.  The patient is critically ill with multiple organ systems failure and requires high complexity decision making for assessment and support, frequent evaluation and titration of therapies, application of advanced monitoring technologies and extensive interpretation of multiple databases.   Critical Care Time devoted to patient care services described in this note is  45  Minutes. This time reflects time of care of this signee Dr Jennet Maduro. This critical  care time does not reflect procedure time, or teaching time or supervisory time of PA/NP/Med student/Med Resident etc but could involve care discussion time.  Rush Farmer, M.D. [facility] Pulmonary/Critical Care Medicine. Pager: [phone]. After hours pager: [phone].

## 2015-11-19 NOTE — Progress Notes (Signed)
PT Cancellation Note  Patient Details Name: Wilhelmine Delisa MRN: [id number] DOB: Mar 09, 1934   Cancelled Treatment:    Reason Eval/Treat Not Completed: Fatigue/lethargy limiting ability to participate;Medical issues which prohibited therapy;Other (comment) (family requesting no therapy today, spoke with nsg who concurs)   Duncan Dull 11/19/2015, 2:09 PM Alben Deeds, [place] DPT  [phone]

## 2015-11-19 NOTE — Progress Notes (Signed)
Patient Name: Kendra Martinez Date of Encounter: 11/19/2015  Primary Cardiologist: new   Principal Problem:   Acute on chronic diastolic CHF (congestive heart failure), NYHA class 4 ([place]) Active Problems:   Pleural effusion   Essential hypertension   Hyponatremia   Sepsis ([place])   Anasarca   New onset a-fib (HCC)   Elevated troponin   Acute respiratory failure with hypoxemia (HCC)   Ascites   ARF (acute renal failure) (HCC)   Cirrhosis of liver with ascites (HCC)   Arterial hypotension   Liver lesion    SUBJECTIVE  Denies any discomfort, no SOB.   CURRENT MEDS . feeding supplement  1 Container Oral BID BM  . magic mouthwash  5 mL Oral TID  . metoprolol tartrate  12.5 mg Oral BID  . midodrine  5 mg Oral TID WC  . mometasone-formoterol  2 puff Inhalation BID  . ondansetron (ZOFRAN) IV  4 mg Intravenous Once  . sertraline  25 mg Oral Daily  . sodium bicarbonate  650 mg Oral BID  . sodium chloride flush  3 mL Intravenous Q12H  . sodium chloride flush  3 mL Intravenous Q12H    OBJECTIVE  Filed Vitals:   11/18/15 1959 11/19/15 0031 11/19/15 0459 11/19/15 0811  BP: 105/76 107/70 105/74   Pulse: 83 70 85   Temp: 97.8 F (36.6 C) 98.2 F (36.8 C) 98.7 F (37.1 C) 97.8 F (36.6 C)  TempSrc: Axillary   Axillary  Resp: 18 15 21    Height:      Weight:   267 lb (121.11 kg)   SpO2: 97% 97% 97%     Intake/Output Summary (Last 24 hours) at 11/19/15 0830 Last data filed at 11/19/15 0700  Gross per 24 hour  Intake 601.68 ml  Output    300 ml  Net 301.68 ml   Filed Weights   11/17/15 0400 11/18/15 0410 11/19/15 0459  Weight: 269 lb 11.2 oz (122.335 kg) 269 lb 6.4 oz (122.199 kg) 267 lb (121.11 kg)    PHYSICAL EXAM  General: Pleasant, NAD. Neuro: Alert and oriented X 3. Moves all extremities spontaneously. Psych: Normal affect. HEENT:  Normal  Neck: Supple without bruits or JVD. Lungs:  Resp regular and unlabored. Decreased breath sound in bilateral base Heart:  Irregular no s3, s4, or murmurs. Abdomen: Soft, non-tender, non-distended, BS + x 4.  Extremities: No clubbing, cyanosis. DP/PT/Radials 2+ and equal bilaterally. 3+ pitting edema  Accessory Clinical Findings  CBC  Recent Labs  11/18/15 0606 11/19/15 0418  WBC 17.6* 16.4*  HGB 12.2 12.1  HCT 37.3 37.3  MCV 93.0 93.3  PLT 244 [id number]   Basic Metabolic Panel  Recent Labs  11/18/15 0606 11/19/15 0418  NA 136 136  K 4.1 4.3  CL 97* 98*  CO2 21* 23  GLUCOSE 109* 103*  BUN 84* 92*  CREATININE 3.09* 3.44*  CALCIUM 8.5* 8.4*   TELE afib with HR 80s    ECG  No new EKG  Echocardiogram 11/14/2015  LV EF: 45% - 50%  ------------------------------------------------------------------- Indications: Atrial fibrillation - 427.31.  ------------------------------------------------------------------- History: PMH: Pleural effusion. Bipolar disorder. Reflux. Ascites. Dyspnea. Congestive heart failure. Risk factors: Hypertension.  ------------------------------------------------------------------- Study Conclusions  - Left ventricle: The cavity size was normal. There was mild focal  basal hypertrophy of the septum. Systolic function was mildly  reduced. The estimated ejection fraction was in the range of 45%  to 50%. Wall motion was normal; there were no regional wall  motion  abnormalities. - Aortic valve: Trileaflet; mildly thickened, mildly calcified  leaflets. - Mitral valve: Calcified annulus. There was trivial regurgitation.    Radiology/Studies  Dg Chest 2 View  11/14/2015  CLINICAL DATA:  Acute respiratory failure with hypoxemia. EXAM: CHEST  2 VIEW COMPARISON:  November 12, 2015. FINDINGS: Stable cardiomediastinal silhouette. No pneumothorax is noted. Left lung is clear. Increased right midlung opacity is noted concerning for subsegmental atelectasis mild right pleural effusion remains. Stable presence of calcified loose body in right shoulder area.  IMPRESSION: Increased right midlung opacity is noted most consistent with subsegmental atelectasis. Mild right pleural effusion is noted as well. Electronically Signed   By: Marijo Conception, M.D.   On: 11/14/2015 07:40   Dg Chest 2 View  11/12/2015  CLINICAL DATA:  Shortness of breath for 2 weeks. Recent diagnosis of pneumonia. EXAM: CHEST  2 VIEW COMPARISON:  None. FINDINGS: Heart size and pulmonary vascularity are normal. There is calcification in the arch of the aorta. There is a moderate right pleural effusion. Left lung is clear. No acute bone abnormality. Degenerative changes of both shoulders with a large calcified loose body in the right shoulder joint in the subcoracoid recess. IMPRESSION: Moderate right pleural effusion.  Aortic atherosclerosis. Electronically Signed   By: Lorriane Shire M.D.   On: 11/12/2015 16:09   Dg Abd 1 View  11/13/2015  CLINICAL DATA:  Abdominal distention EXAM: ABDOMEN - 1 VIEW COMPARISON:  None. FINDINGS: Mild gastric distention. Nonobstructive bowel gas pattern. Cholecystectomy clips. Degenerative changes of the visualized thoracolumbar spine. IMPRESSION: Unremarkable abdominal radiograph. Electronically Signed   By: Julian Hy M.D.   On: 11/13/2015 00:04   Ct Chest Wo Contrast  11/12/2015  CLINICAL DATA:  Shortness of breath, 2 weeks duration. Treated for pneumonia. Pleural effusion. EXAM: CT CHEST WITHOUT CONTRAST TECHNIQUE: Multidetector CT imaging of the chest was performed following the standard protocol without IV contrast. COMPARISON:  Radiography same day FINDINGS: There is a moderate to large pleural effusion on the right layering dependently. There is dependent atelectasis in the right lung. The aerated portion of the right lung is clear except for minimal atelectasis or scarring in the middle lobe. No pleural effusion on the left. There is minimal atelectasis at the left base. The left lung is otherwise clear. No underlying emphysema. No mediastinal or  hilar mass or lymphadenopathy. There is atherosclerosis of the aorta and extensive coronary artery calcification. Scans in the upper abdomen show a large amount of ascites. There are at least 2 large liver cysts, not completely evaluated. The larger cyst at the junction of the right and left lobes measures up to 11 cm in diameter. Right lobe cyst partially imaged measures up to 7 cm in diameter. IMPRESSION: Right pleural effusion layering dependently with dependent pulmonary atelectasis. The aerated lung does not show evidence of pre-existing lung disease or active pneumonia. Atherosclerosis of the aorta and the coronary arteries. Large amount of ascites. Two large liver cysts. The abdomen was not completely evaluated. Electronically Signed   By: Nelson Chimes M.D.   On: 11/12/2015 18:30   US Abdomen Complete  11/14/2015  CLINICAL DATA:  Ascites. Evaluate liver. Acute renal failure. History of hypertension and congestive heart failure. History of a cholecystectomy. EXAM: ABDOMEN ULTRASOUND COMPLETE COMPARISON:  11/13/2015 FINDINGS: Gallbladder: Surgically absent. Common bile duct: Diameter: 6 mm Liver: 2 large cystic areas, 1 projecting along the anterior segment right lobe/medial segmental left lobe measuring 8.9 x 7.8 x 10.3 cm. The other arising  from the posterior aspect of the right lobe measuring 6.6 x 6.1 x 7.3 cm. Liver demonstrates a coarsened echotexture with a nodular surface contour. Liver normal in overall size. IVC: Limited visualization.  No gross abnormality. Pancreas: Limited visualization.  No gross abnormality. Spleen: Size and appearance within normal limits. Right Kidney: Length: 10.6 cm. Mild renal cortical thinning. Echogenicity within normal limits. No mass or hydronephrosis visualized. Left Kidney: Length: 10.9 cm. Mild renal cortical thinning. Echogenicity within normal limits. No mass or hydronephrosis visualized. Abdominal aorta: Not well seen from the midportion to the bifurcation.  Gross aneurysm. Other findings: Moderate amount of ascites similar to the previous day's study. Right pleural effusion. IMPRESSION: 1. Appearance of the liver is consistent with cirrhosis. There are 2 large with her cystic masses, likely simple hepatic cysts. Consider follow-up liver MRI with and without contrast for further assessment and lesion characterization. 2. No acute findings. 3. Moderate ascites. Electronically Signed   By: Lajean Manes M.D.   On: 11/14/2015 15:43   Dg Chest Port 1 View  11/16/2015  CLINICAL DATA:  Status post right thoracentesis. EXAM: PORTABLE CHEST 1 VIEW COMPARISON:  11/14/2015 FINDINGS: Normal heart size. There is scratch set there has been interval decrease in volume of the right pleural effusion. No pneumothorax identified. Asymmetric elevation of left hemidiaphragm is again noted. IMPRESSION: 1. Decrease in volume of right pleural effusion following thoracentesis. No pneumothorax. Electronically Signed   By: Kerby Moors M.D.   On: 11/16/2015 14:09   Dg Abd Portable 2v  11/18/2015  CLINICAL DATA:  Abdominal pain EXAM: PORTABLE ABDOMEN - 2 VIEW COMPARISON:  11/12/2015 abdominal radiograph FINDINGS: Moderate gaseous distention of the stomach. No dilated small bowel loops or air-fluid levels. Mild stool throughout the colon. No evidence of pneumatosis or pneumoperitoneum. Marked degenerative changes in the visualized thoracolumbar spine. Cholecystectomy clips are seen in the right upper quadrant of the abdomen. IMPRESSION: 1. Nonspecific moderate gaseous distention of the stomach . 2. No evidence of small or large bowel obstruction. Electronically Signed   By: Ilona Sorrel M.D.   On: 11/18/2015 14:12   US Abdomen Limited Ruq  11/13/2015  CLINICAL DATA:  Abdominal distention for 1 month EXAM: LIMITED ABDOMEN ULTRASOUND FOR ASCITES TECHNIQUE: Limited ultrasound survey for ascites was performed in all four abdominal quadrants. COMPARISON:  None. FINDINGS: There is moderate  generalized ascites. There are loops of surrounding peristalsing bowel. IMPRESSION: Moderate generalized ascites. Electronically Signed   By: Lowella Grip III M.D.   On: 11/13/2015 09:22    ASSESSMENT AND PLAN  1. Massive fluid overload likely combination of acute on chronic diastolic heart failure, liver cirrhosis and hypoalbuminemia - Urinary output low, lasix stopped due to worsening Cr. Cr continue to be worsening. Arrived with Cr 2.5, now 3.5, urine outpatient oliguric  - She had thoracentesis, GI suspect cirrhosis with possible hepatic hydrothorax and family wish to pursue paracentesis to find out why she had drastic decline, unfortunately paracentesis may cause further fluid shift and worsening renal function. However, she is very unlikely to recover as it is. Need to have discussion regarding goal of care and potentially consider comfort care. Her prognosis is very poor.  2. Newly diagnosed atrial fibrillation - CHA2DS2-Vasc score 3 (HF, female, HTN) - on IV heparin. No current plan for systemic anticoagulation as given her very poor prognosis  - continue on metoprolol.  3. Recurrent R pleural effusion: s/p bedside thoracentesis 4/21, removed 500cc, procedure stopped as she felt faint  4. Moderate  ascites: U/S of abdomen shows cirrhosis with moderate ascites, seen by GI, planning to pursue paracentesis  5. Recent Klebsiella pneumonia UTI treated at [place], Almyra Deforest PA-C Pager: [phone]

## 2015-11-19 NOTE — Progress Notes (Signed)
Pt rested well after the procedure today. Pt currently visiting with family, appears to be in good spirits. Emotional support given. U/O for shift 166ml. Will continue to monitor

## 2015-11-19 NOTE — Progress Notes (Signed)
ANTICOAGULATION CONSULT NOTE - Follow Up Consult  Pharmacy Consult:  Heparin Indication: atrial fibrillation  Allergies  Allergen Reactions  . Sulfa Antibiotics Hives    Patient Measurements: Height: 5\' 3"  (160 cm) Weight: 267 lb (121.11 kg) IBW/kg (Calculated) : 52.4 Heparin Dosing Weight: 82 kg  Vital Signs: Temp: 97.8 F (36.6 C) (04/24 0811) Temp Source: Axillary (04/24 0811) BP: 118/80 mmHg (04/24 0810) Pulse Rate: 90 (04/24 0810)  Labs:  Recent Labs  11/17/15 0218 11/17/15 1010 11/18/15 0606 11/19/15 0418  HGB 12.1  --  12.2 12.1  HCT 36.3  --  37.3 37.3  PLT 236  --  244 229  HEPARINUNFRC 0.33 0.40 0.56 0.60  CREATININE 2.84*  --  3.09* 3.44*    Estimated Creatinine Clearance: 16.7 mL/min (by C-G formula based on Cr of 3.44).   Assessment: 65 YOF with new-onset AFib.  HL has been therapeutic.  Goal of Therapy:  Heparin level 0.3-0.7 units/ml Monitor platelets by anticoagulation protocol: Yes    Plan:  - Continue heparin infusion at 1050 units/hr - Daily HL, CBC - Monitor s/s of bleeding  Thank you Anette Guarneri, PharmD [phone] 11/19/2015 11:09 AM

## 2015-11-19 NOTE — Progress Notes (Signed)
[facility] PROGRESS NOTE  Kendra Martinez [id number] DOB: Nov 22, 1933 DOA: 11/12/2015  PCP: She has recently relocated to this area. Does not have any providers here.  Brief HPI: 82 year old Caucasian female with a past medical history of hypertension, diastolic CHF, recently treated pneumonia at an outside facility, presented with was needing shortness of breath. Patient is new to [facility]. She has recently relocated to this area. Does not see any providers here. Patient was seen by multiple specialists including cardiology, pulmonology, nephrology and gastroenterology. She was found to have a new diagnosis of liver cirrhosis with ascites. She underwent thoracentesis which was thought to be a transudative fluid. Patient remains significantly fluid overloaded. She does not have good urine output. Her renal function is declining. This is thought to be hepatorenal.  Past medical history:  Past Medical History  Diagnosis Date  . CHF (congestive heart failure) ([place])   . Hypertension   . Pleural effusion 10/2015  . Bipolar disorder ([place])   . GERD (gastroesophageal reflux disease)   . Ascites   . Arthritis     oa in knees    Consultants: Cardiology. Pulmonology. Nephrology. Gastroenterology. Palliative medicine  Procedures:  Transthoracic echocardiogram Study Conclusions - Left ventricle: The cavity size was normal. There was mild focal basal hypertrophy of the septum. Systolic function was mildly reduced. The estimated ejection fraction was in the range of 45% to 50%. Wall motion was normal; there were no regional wall motion abnormalities. - Aortic valve: Trileaflet; mildly thickened, mildly calcified leaflets. - Mitral valve: Calcified annulus. There was trivial regurgitation.  Thoracentesis 4/21  Paracentesis planned for today  Antibiotics: Vancomycin and Zosyn 4/17-->4/20  Subjective: Patient feels slightly better this morning. Not as nauseated as she was yesterday.  Denies any chest pain or abdominal pain. Continues to feel short of breath at times. Patient's husband is at the bedside.  Objective:   Vital Signs  Filed Vitals:   11/18/15 1605 11/18/15 1959 11/19/15 0031 11/19/15 0459  BP: 117/79 105/76 107/70 105/74  Pulse: 72 83 70 85  Temp: 98.6 F (37 C) 97.8 F (36.6 C) 98.2 F (36.8 C) 98.7 F (37.1 C)  TempSrc: Axillary Axillary    Resp: 19 18 15 21   Height:      Weight:    121.11 kg (267 lb)  SpO2: 99% 97% 97% 97%    Intake/Output Summary (Last 24 hours) at 11/19/15 0807 Last data filed at 11/19/15 0700  Gross per 24 hour  Intake 601.68 ml  Output    300 ml  Net 301.68 ml   Filed Weights   11/17/15 0400 11/18/15 0410 11/19/15 0459  Weight: 122.335 kg (269 lb 11.2 oz) 122.199 kg (269 lb 6.4 oz) 121.11 kg (267 lb)    General appearance: alert, cooperative, Fatigued. Resp: Improved air entry on the right. Remains diminished at the bases. No definite crackles. No wheezing or rhonchi. Cardio: regular rate and rhythm, S1, S2 normal, no murmur, click, rub or gallop GI: Abdomen is soft. Slightly tender in the left lower quadrant without any rebound, rigidity or guarding. No masses or organomegaly. ascites. Extremities: Bilateral pedal edema is present. Seems to be better. Neurologic: Alert and oriented X 3. No focal deficits.  Lab Results:  Data Reviewed: I have personally reviewed following labs and imaging studies  CBC:  Recent Labs Lab 11/12/15 1619  11/15/15 0346 11/16/15 0359 11/17/15 0218 11/18/15 0606 11/19/15 0418  WBC 22.3*  < > 18.8* 15.6* 18.0* 17.6* 16.4*  NEUTROABS 19.9*  --   --   --   --   --   --  HGB 13.0  < > 11.7* 12.0 12.1 12.2 12.1  HCT 39.2  < > 36.1 36.8 36.3 37.3 37.3  MCV 92.7  < > 92.3 92.5 92.8 93.0 93.3  PLT 219  < > 223 229 236 244 229  < > = values in this interval not displayed. Basic Metabolic Panel:  Recent Labs Lab 11/13/15 0440  11/15/15 0346 11/16/15 0359 11/17/15 0218  11/18/15 0606 11/19/15 0418  NA 132*  < > 136 137 133* 136 136  K 4.1  < > 3.9 4.1 4.3 4.1 4.3  CL 96*  < > 97* 99* 99* 97* 98*  CO2 19*  < > 21* 22 17* 21* 23  GLUCOSE 116*  < > 103* 103* 108* 109* 103*  BUN 52*  < > 65* 70* 76* 84* 92*  CREATININE 2.47*  < > 2.73* 2.87* 2.84* 3.09* 3.44*  CALCIUM 8.0*  < > 8.0* 8.2* 8.2* 8.5* 8.4*  MG 1.6*  --   --   --   --   --   --   PHOS 3.9  --   --   --   --   --   --   < > = values in this interval not displayed.  GFR: Estimated Creatinine Clearance: 16.7 mL/min (by C-G formula based on Cr of 3.44).  Liver Function Tests:  Recent Labs Lab 11/12/15 2330 11/13/15 0440 11/16/15 0359  AST 21 25 21   ALT 15 18 15   ALKPHOS 55 59 56  BILITOT 1.1 1.4* 1.0  PROT 5.2* 5.3* 5.3*  5.2*  ALBUMIN 2.2* 2.3* 2.0*   Coagulation Profile:  Recent Labs Lab 11/13/15 0440 11/16/15 0359  INR 1.59* 1.22   Cardiac Enzymes:  Recent Labs Lab 11/12/15 2330 11/13/15 0440 11/13/15 1120  TROPONINI 0.11* 0.08* 0.07*   Urine analysis:    Component Value Date/Time   COLORURINE YELLOW 11/13/2015 1132   APPEARANCEUR CLOUDY* 11/13/2015 1132   LABSPEC 1.023 11/13/2015 1132   PHURINE 5.5 11/13/2015 1132   GLUCOSEU NEGATIVE 11/13/2015 1132   HGBUR NEGATIVE 11/13/2015 1132   BILIRUBINUR MODERATE* 11/13/2015 1132   KETONESUR NEGATIVE 11/13/2015 1132   PROTEINUR 30* 11/13/2015 1132   NITRITE NEGATIVE 11/13/2015 1132   LEUKOCYTESUR MODERATE* 11/13/2015 1132    Recent Results (from the past 240 hour(s))  Blood culture (routine x 2)     Status: None   Collection Time: 11/12/15  8:20 PM  Result Value Ref Range Status   Specimen Description BLOOD RIGHT ANTECUBITAL  Final   Special Requests IN PEDIATRIC BOTTLE 3CC  Final   Culture NO GROWTH 5 DAYS  Final   Report Status 11/17/2015 FINAL  Final  Blood culture (routine x 2)     Status: None   Collection Time: 11/12/15  8:25 PM  Result Value Ref Range Status   Specimen Description BLOOD RIGHT HAND   Final   Special Requests IN PEDIATRIC BOTTLE 3CC  Final   Culture NO GROWTH 5 DAYS  Final   Report Status 11/17/2015 FINAL  Final  Urine culture     Status: Abnormal   Collection Time: 11/13/15 11:32 AM  Result Value Ref Range Status   Specimen Description URINE, CLEAN CATCH  Final   Special Requests NONE  Final   Culture 60,000 COLONIES/mL YEAST (A)  Final   Report Status 11/14/2015 FINAL  Final  MRSA PCR Screening     Status: None   Collection Time: 11/13/15  5:21 PM  Result Value Ref Range Status  MRSA by PCR NEGATIVE NEGATIVE Final    Comment:        The GeneXpert MRSA Assay (FDA approved for NASAL specimens only), is one component of a comprehensive MRSA colonization surveillance program. It is not intended to diagnose MRSA infection nor to guide or monitor treatment for MRSA infections.   Culture, body fluid-bottle     Status: None (Preliminary result)   Collection Time: 11/16/15 12:40 PM  Result Value Ref Range Status   Specimen Description FLUID RIGHT PLEURAL  Final   Special Requests BOTTLES DRAWN AEROBIC AND ANAEROBIC 3CC  Final   Culture NO GROWTH 2 DAYS  Final   Report Status PENDING  Incomplete  Gram stain     Status: None   Collection Time: 11/16/15 12:40 PM  Result Value Ref Range Status   Specimen Description FLUID RIGHT PLEURAL  Final   Special Requests NONE  Final   Gram Stain   Final    ABUNDANT WBC PRESENT,BOTH PMN AND MONONUCLEAR NO ORGANISMS SEEN    Report Status 11/16/2015 FINAL  Final      Radiology Studies: Dg Abd Portable 2v  11/18/2015  CLINICAL DATA:  Abdominal pain EXAM: PORTABLE ABDOMEN - 2 VIEW COMPARISON:  11/12/2015 abdominal radiograph FINDINGS: Moderate gaseous distention of the stomach. No dilated small bowel loops or air-fluid levels. Mild stool throughout the colon. No evidence of pneumatosis or pneumoperitoneum. Marked degenerative changes in the visualized thoracolumbar spine. Cholecystectomy clips are seen in the right upper  quadrant of the abdomen. IMPRESSION: 1. Nonspecific moderate gaseous distention of the stomach . 2. No evidence of small or large bowel obstruction. Electronically Signed   By: Ilona Sorrel M.D.   On: 11/18/2015 14:12     Medications:  Scheduled: . feeding supplement  1 Container Oral BID BM  . magic mouthwash  5 mL Oral TID  . metoprolol tartrate  12.5 mg Oral BID  . midodrine  5 mg Oral TID WC  . mometasone-formoterol  2 puff Inhalation BID  . ondansetron (ZOFRAN) IV  4 mg Intravenous Once  . sertraline  25 mg Oral Daily  . sodium bicarbonate  650 mg Oral BID  . sodium chloride flush  3 mL Intravenous Q12H  . sodium chloride flush  3 mL Intravenous Q12H   Continuous: . heparin 1,050 Units/hr (11/18/15 1957)   [id number] chloride, acetaminophen **OR** acetaminophen, ALPRAZolam, HYDROcodone-acetaminophen, ondansetron **OR** ondansetron (ZOFRAN) IV, prochlorperazine, sodium chloride flush, traMADol  Assessment/Plan:  Principal Problem:   Acute on chronic diastolic CHF (congestive heart failure), NYHA class 4 (HCC) Active Problems:   Pleural effusion   Essential hypertension   Hyponatremia   Sepsis ([place])   Anasarca   New onset a-fib (HCC)   Elevated troponin   Acute respiratory failure with hypoxemia (H[facility])   Ascites   ARF (acute renal failure) (H[facility])   Cirrhosis of liver with ascites (H[facility])   Arterial hypotension   Liver lesion    Acute on chronic kidney disease Based on care-everywhere, her BUN and creatinine was normal in July 2016. Worsening in creatinine noted early April. So this appears to be acute renal failure. She did receive IV contrast for CT imaging in February. Now that she is suspected of having liver cirrhosis, renal failure could be suggestive of hepatorenal syndrome. Nephrology is following. Urine output remains poor. Weight gain continues to rise. Discussed with Dr. Justin Mend yesterday. Further testing was ordered. Nephrology did discuss dialysis and further  diagnostic modalities with patient. Patient and Family uncertain of  dialysis at this time. I think at this point in time, it seems reasonable to involve palliative medicine to discuss goals of care with the patient and family. Patient and her husband agreed to this. We'll consult them.  Liver cirrhosis of unclear etiology/hepatic cysts Etiology unclear but could be NASH. Gastroenterology is following. Hepatitis panel is negative. Ultrasound report reviewed. Liver cysts also noted. May need MRI at some point. She is hypoalbuminemic. PT/INR was elevated. INR normal 4/21. Discussed with Dr. Carlean Purl yesterday. CT scan of the abdomen, pelvis, and a diagnostic paracentesis has been ordered.   Right-sided pleural effusion Etiology is unclear but probably transudative secondary to liver cirrhosis or heart failure. Patient was seen by pulmonology. Patient underwent thoracentesis 4/21. LDH in the fluid was 139. Total protein less than 3. This is suggestive of a transudative process. Cultures are pending. Most likely due to her liver process. CT scan of the chest did not show any infiltrates. Echocardiogram only showed minimally depressed ejection fraction. PA pressure is noted to be normal.   Anasarca with ascites, pleural effusion,  According to patient reports she could've gained approximately 40-50 pounds or even more over the last many months. Reason for her presentation is not entirely clear, but studies done so far suggests liver cirrhosis could be the main reason. Echocardiogram shows EF of 45-50%. PA pressures are normal. Ultrasound did show evidence for liver cirrhosis. She has hypoalbuminemia. Her PT/INR was slightly elevated. Patient denies any history of liver disease, hepatitis. Hepatitis panel is negative. Lasix was discontinued due to worsening creatinine.    Nausea and vomiting Most probably due to renal failure and uremia. Abdomen is benign. She patient did have a CT scan of her abdomen and pelvis  in February when she was hospitalized in another facility. Plain imaging films obtained yesterday did not show any acute findings.   Hypotension Blood pressure was low and has improved with midodrine. Metoprolol dose was decreased with holding parameters. Continue to monitor closely.  New onset atrial fibrillation Cardiology is following. Echocardiogram is as above. TSH is 3.12. CHADs2vasc score is 3. Patient started on intravenous heparin. Heart rate is reasonably well controlled. Continue metoprolol.  Recent Pneumonia in early April Diagnosed at an outside facility. She has completed treatment. No clear evidence for infectious process at this time. She does have an elevated WBC, but she is afebrile. Cultures negative so far. Antibiotics were discontinued 4/20. Sepsis appears to have been ruled out.  Mildly elevated troponin Likely demand ischemia. Cardiology is following.  Essential hypertension See above.  DVT Prophylaxis: On IV heparin    Code Status: Full Code  Family Communication: Discussed in detail with patient and her husband today. Patient and husband still confused about dialysis. They do understand that the patient is declining. They're very frustrated at the lack of clear reason for her medical conditions. I discussed palliative medicine input and goals of care again with them today. They're agreeable to talk to palliative medicine at this time.  Disposition Plan: Management as outlined above. Mobilize as tolerated.     LOS: 7 days   [facility] Pager [phone] 11/19/2015, 8:07 AM  If 7PM-7AM, please contact night-coverage at [email or web address], password [facility]

## 2015-11-19 NOTE — Consult Note (Signed)
[name]  Date of Admission:  11/12/2015  Date of Consult:  11/19/2015  Reason for Consult: liver cysts Referring Physician: Yacoub  Impression/Recommendation Pleural Effusion Liver cysts vs abcess NASH Hepatorenal syndrome  Await CT abd Consider aspirate of liver cysts vs abscess Watch off atbx for now Not a HD candidate Goals of care  Thank you so much for this interesting consult,   Kendra Martinez (pager) [phone] [email or web address]  Kendra Martinez is an 82 y.o. female.  HPI: 82 yo F with hx diastolic heart failure, pneumonia, klebsiella (R- amp) UTI adm 4-3 ([place]). She was also found to have multiple liver cysts. She was d/c home on 4-8 on levaquin/keflex. Her Cr was 2.04 at d/c.  At home she was unable to take her lasix and returned with increasing SOB, fatigue. She returned to hospital on 4-17. She was afebrile, but hypotensive. She had WBC 22.3, Cr 2.56, pO2 69, lactate 2.26. She was also found to have new afib. She was started on vanco/zosyn/diflucan, heparin. She had CT chest showing: Right pleural effusion layering dependently with dependent pulmonary atelectasis. The aerated lung does not show evidence of pre-existing lung disease or active pneumonia. Atherosclerosis of the aorta and the coronary arteries. Large amount of ascites. Two large liver cysts. The abdomen was not completely evaluated.  She underwent thoracentesis on 4-21 showing 500 cc of clear fluid. Protein and LDH suggest transudate (though WBC 1592, 56%N). She had u/s guided paracentesis today. Alb 1.6, WBC 386 (18%N).  Has been afebrile, WBC remains elevated.   Past Medical History  Diagnosis Date  . CHF (congestive heart failure) ([facility])   . Hypertension   . Pleural effusion 10/2015  . Bipolar disorder ([place])   . GERD (gastroesophageal reflux disease)   . Ascites   . 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  Cholecystectomy       Allergies  Allergen Reactions  . Sulfa Antibiotics Hives    Medications:  Scheduled: . feeding supplement  1 Container Oral BID BM  . magic mouthwash  5 mL Oral TID  . metoprolol tartrate  12.5 mg Oral BID  . midodrine  10 mg Oral TID WC  . mometasone-formoterol  2 puff Inhalation BID  . octreotide  100 mcg Subcutaneous Q12H  . ondansetron (ZOFRAN) IV  4 mg Intravenous Once  . sertraline  25 mg Oral Daily  . sodium bicarbonate  650 mg Oral BID  . sodium chloride flush  3 mL Intravenous Q12H  . sodium chloride flush  3 mL Intravenous Q12H    Abtx:  Anti-infectives    Start     Dose/Rate Route Frequency Ordered Stop   11/14/15 2030  vancomycin (VANCOCIN) 1,500 mg in sodium chloride 0.9 % 500 mL IVPB  Status:  Discontinued     1,500 mg 250 mL/hr over 120 Minutes Intravenous Every 48 hours 11/13/15 0335 11/15/15 1004   11/13/15 2200  fluconazole (DIFLUCAN) IVPB 100 mg     100 mg 50 mL/hr over 60 Minutes Intravenous Every 24 hours 11/12/15 2314 11/18/15 2223   11/13/15 0500  piperacillin-tazobactam (ZOSYN) IVPB 3.375 g  Status:  Discontinued     3.375 g 12.5 mL/hr over 240 Minutes Intravenous Every 8 hours 11/13/15 0335 11/15/15 1004   11/12/15 2315  fluconazole (DIFLUCAN) IVPB 200 mg     200 mg 100 mL/hr over 60 Minutes Intravenous  Once 11/12/15 2313  11/13/15 0239   11/12/15 2030  vancomycin (VANCOCIN) 2,000 mg in sodium chloride 0.9 % 500 mL IVPB     2,000 mg 250 mL/hr over 120 Minutes Intravenous  Once 11/12/15 2022 11/12/15 2347   11/12/15 1915  piperacillin-tazobactam (ZOSYN) IVPB 3.375 g     3.375 g 100 mL/hr over 30 Minutes Intravenous  Once 11/12/15 1913 11/12/15 2344   11/12/15 1915  vancomycin (VANCOCIN) IVPB 1000 mg/200 mL premix  Status:  Discontinued     1,000 mg 200 mL/hr over 60 Minutes Intravenous  Once 11/12/15 1913 11/12/15 2022      Total days of antibiotics: 4  (4-7 --> 4-20  Vanco/zosyn/diflucan)          Social History:   reports that she has never smoked. She has never used smokeless tobacco. She reports that she does not drink alcohol or use illicit drugs.  Family History  Problem Relation Age of Onset  . Kidney cancer Mother   . COPD Father   . Diabetes type II Other   . Stroke Neg Hx     General ROS: pt has laryngitis per family, not able to communicate clearly  Blood pressure 104/68, pulse 82, temperature 98.6 F (37 C), temperature source Oral, resp. rate 19, height _0  (1.6 m), weight 121.11 kg (267 lb), SpO2 97 %. General appearance: alert, cooperative, fatigued and mild distress Eyes: negative findings: conjunctivae and sclerae normal and pupils equal, round, reactive to light and accomodation, positive findings: eyelids/periorbital: edema Throat: normal findings: no thrush and abnormal findings: dry Neck: no adenopathy and supple, symmetrical, trachea midline Lungs: diminished breath sounds bilaterally Heart: regular rate and rhythm Abdomen: normal findings: bowel sounds normal and soft, non-tender and abnormal findings:  distended and ? fluid wave Extremities: edema anasarca   Results for orders placed or performed during the hospital encounter of 11/12/15 (from the past 48 hour(s))  CBC     Status: Abnormal   Collection Time: 11/18/15  6:06 AM  Result Value Ref Range   WBC 17.6 (H) 4.0 - 10.5 K/uL   RBC 4.01 3.87 - 5.11 MIL/uL   Hemoglobin 12.2 12.0 - 15.0 g/dL   HCT 37.3 36.0 - 46.0 %   MCV 93.0 78.0 - 100.0 fL   MCH 30.4 26.0 - 34.0 pg   MCHC 32.7 30.0 - 36.0 g/dL   RDW 15.1 11.5 - 15.5 %   Platelets 244 150 - 400 K/uL  Heparin level (unfractionated)     Status: None   Collection Time: 11/18/15  6:06 AM  Result Value Ref Range   Heparin Unfractionated 0.56 0.30 - 0.70 IU/mL    Comment:        IF HEPARIN RESULTS ARE BELOW EXPECTED VALUES, AND PATIENT DOSAGE HAS BEEN CONFIRMED, SUGGEST FOLLOW UP TESTING OF ANTITHROMBIN III LEVELS.   Basic metabolic panel     Status:  Abnormal   Collection Time: 11/18/15  6:06 AM  Result Value Ref Range   Sodium 136 135 - 145 mmol/L   Potassium 4.1 3.5 - 5.1 mmol/L   Chloride 97 (L) 101 - 111 mmol/L   CO2 21 (L) 22 - 32 mmol/L   Glucose, Bld 109 (H) 65 - 99 mg/dL   BUN 84 (H) 6 - 20 mg/dL   Creatinine, Ser 3.09 (H) 0.44 - 1.00 mg/dL   Calcium 8.5 (L) 8.9 - 10.3 mg/dL   GFR calc non Af Amer 13 (L) >60 mL/min   GFR calc Af Amer 15 (L) >60  mL/min    Comment: (NOTE) The eGFR has been calculated using the CKD EPI equation. This calculation has not been validated in all clinical situations. eGFR's persistently <60 mL/min signify possible Chronic Kidney Disease.    Anion gap 18 (H) 5 - 15  Protein / creatinine ratio, urine     Status: Abnormal   Collection Time: 11/18/15 12:08 PM  Result Value Ref Range   Creatinine, Urine 170.47 mg/dL   Total Protein, Urine 30 mg/dL    Comment: NO NORMAL RANGE ESTABLISHED FOR THIS TEST   Protein Creatinine Ratio 0.18 (H) 0.00 - 0.15 mg/mg[Cre]  CBC     Status: Abnormal   Collection Time: 11/19/15  4:18 AM  Result Value Ref Range   WBC 16.4 (H) 4.0 - 10.5 K/uL   RBC 4.00 3.87 - 5.11 MIL/uL   Hemoglobin 12.1 12.0 - 15.0 g/dL   HCT 37.3 36.0 - 46.0 %   MCV 93.3 78.0 - 100.0 fL   MCH 30.3 26.0 - 34.0 pg   MCHC 32.4 30.0 - 36.0 g/dL   RDW 15.2 11.5 - 15.5 %   Platelets 229 150 - 400 K/uL  Heparin level (unfractionated)     Status: None   Collection Time: 11/19/15  4:18 AM  Result Value Ref Range   Heparin Unfractionated 0.60 0.30 - 0.70 IU/mL    Comment:        IF HEPARIN RESULTS ARE BELOW EXPECTED VALUES, AND PATIENT DOSAGE HAS BEEN CONFIRMED, SUGGEST FOLLOW UP TESTING OF ANTITHROMBIN III LEVELS.   Basic metabolic panel     Status: Abnormal   Collection Time: 11/19/15  4:18 AM  Result Value Ref Range   Sodium 136 135 - 145 mmol/L   Potassium 4.3 3.5 - 5.1 mmol/L   Chloride 98 (L) 101 - 111 mmol/L   CO2 23 22 - 32 mmol/L   Glucose, Bld 103 (H) 65 - 99 mg/dL   BUN 92  (H) 6 - 20 mg/dL   Creatinine, Ser 3.44 (H) 0.44 - 1.00 mg/dL   Calcium 8.4 (L) 8.9 - 10.3 mg/dL   GFR calc non Af Amer 12 (L) >60 mL/min   GFR calc Af Amer 14 (L) >60 mL/min    Comment: (NOTE) The eGFR has been calculated using the CKD EPI equation. This calculation has not been validated in all clinical situations. eGFR's persistently <60 mL/min signify possible Chronic Kidney Disease.    Anion gap 15 5 - 15  Ammonia     Status: None   Collection Time: 11/19/15  4:18 AM  Result Value Ref Range   Ammonia 33 9 - 35 umol/L  Albumin, pleural or peritoneal fluid     Status: None   Collection Time: 11/19/15 12:42 PM  Result Value Ref Range   Albumin, Fluid 1.6 g/dL    Comment: (NOTE) No normal range established for this test Results should be evaluated in conjunction with serum values    Fluid Type-FALB Peritoneal   Protein, pleural or peritoneal fluid     Status: None   Collection Time: 11/19/15 12:42 PM  Result Value Ref Range   Total protein, fluid <3.0 g/dL    Comment: (NOTE) No normal range established for this test Results should be evaluated in conjunction with serum values    Fluid Type-FTP Peritoneal   Body fluid cell count with differential     Status: Abnormal   Collection Time: 11/19/15 12:42 PM  Result Value Ref Range   Fluid Type-FCT Peritoneal  Color, Fluid YELLOW YELLOW   Appearance, Fluid HAZY (A) CLEAR   WBC, Fluid 386 0 - 1000 cu mm   Neutrophil Count, Fluid 18 0 - 25 %   Lymphs, Fluid 58 %   Monocyte-Macrophage-Serous Fluid 24 (L) 50 - 90 %  Culture, body fluid-bottle     Status: None (Preliminary result)   Collection Time: 11/19/15 12:44 PM  Result Value Ref Range   Specimen Description FLUID PERITONEAL    Special Requests BOTTLES DRAWN AEROBIC AND ANAEROBIC 8CC    Culture PENDING    Report Status PENDING   Gram stain     Status: None   Collection Time: 11/19/15 12:44 PM  Result Value Ref Range   Specimen Description FLUID PERITONEAL     Special Requests NONE    Gram Stain      WBC PRESENT,BOTH PMN AND MONONUCLEAR NO ORGANISMS SEEN CYTOSPIN SMEAR    Report Status 11/19/2015 FINAL       Component Value Date/Time   SDES FLUID PERITONEAL 11/19/2015 1244   SDES FLUID PERITONEAL 11/19/2015 1244   SPECREQUEST BOTTLES DRAWN AEROBIC AND ANAEROBIC 8CC 11/19/2015 1244   SPECREQUEST NONE 11/19/2015 1244   CULT PENDING 11/19/2015 1244   REPTSTATUS PENDING 11/19/2015 1244   REPTSTATUS 11/19/2015 FINAL 11/19/2015 1244   US Paracentesis  11/19/2015  INDICATION: Suspected cirrhosis. Chronic diastolic heart failure. Fluid overload. Ascites. Request for diagnostic paracentesis with 250 ml maximum. EXAM: ULTRASOUND GUIDED LEFT ANTERIOR ABDOMEN PARACENTESIS MEDICATIONS: 1% Lidocaine. COMPLICATIONS: None immediate. PROCEDURE: Informed written consent was obtained from the patient after a discussion of the risks, benefits and alternatives to treatment. A timeout was performed prior to the initiation of the procedure. Initial ultrasound scanning demonstrates a large amount of ascites within the right lower abdominal quadrant. The right lower abdomen was prepped and draped in the usual sterile fashion. 1% lidocaine with epinephrine was used for local anesthesia. Following this, a 19 gauge, 7-cm, Yueh catheter was introduced. An ultrasound image was saved for documentation purposes. The paracentesis was performed. The catheter was removed and a dressing was applied. The patient tolerated the procedure well without immediate post procedural complication. FINDINGS: A total of approximately 250 ml of clear yellow fluid was removed. Samples were sent to the laboratory as requested by the clinical team. IMPRESSION: Successful ultrasound-guided paracentesis yielding 250 ml of peritoneal fluid. Read by:  Gareth Eagle, PA-C Electronically Signed   By: Markus Daft M.D.   On: 11/19/2015 14:42   Dg Abd Portable 2v  11/18/2015  CLINICAL DATA:  Abdominal pain EXAM:  PORTABLE ABDOMEN - 2 VIEW COMPARISON:  11/12/2015 abdominal radiograph FINDINGS: Moderate gaseous distention of the stomach. No dilated small bowel loops or air-fluid levels. Mild stool throughout the colon. No evidence of pneumatosis or pneumoperitoneum. Marked degenerative changes in the visualized thoracolumbar spine. Cholecystectomy clips are seen in the right upper quadrant of the abdomen. IMPRESSION: 1. Nonspecific moderate gaseous distention of the stomach . 2. No evidence of small or large bowel obstruction. Electronically Signed   By: Ilona Sorrel M.D.   On: 11/18/2015 14:12   Recent Results (from the past 240 hour(s))  Blood culture (routine x 2)     Status: None   Collection Time: 11/12/15  8:20 PM  Result Value Ref Range Status   Specimen Description BLOOD RIGHT ANTECUBITAL  Final   Special Requests IN PEDIATRIC BOTTLE 3CC  Final   Culture NO GROWTH 5 DAYS  Final   Report Status 11/17/2015 FINAL  Final  Blood culture (routine x 2)     Status: None   Collection Time: 11/12/15  8:25 PM  Result Value Ref Range Status   Specimen Description BLOOD RIGHT HAND  Final   Special Requests IN PEDIATRIC BOTTLE 3CC  Final   Culture NO GROWTH 5 DAYS  Final   Report Status 11/17/2015 FINAL  Final  Urine culture     Status: Abnormal   Collection Time: 11/13/15 11:32 AM  Result Value Ref Range Status   Specimen Description URINE, CLEAN CATCH  Final   Special Requests NONE  Final   Culture 60,000 COLONIES/mL YEAST (A)  Final   Report Status 11/14/2015 FINAL  Final  MRSA PCR Screening     Status: None   Collection Time: 11/13/15  5:21 PM  Result Value Ref Range Status   MRSA by PCR NEGATIVE NEGATIVE Final    Comment:        The GeneXpert MRSA Assay (FDA approved for NASAL specimens only), is one component of a comprehensive MRSA colonization surveillance program. It is not intended to diagnose MRSA infection nor to guide or monitor treatment for MRSA infections.   Culture, body  fluid-bottle     Status: None (Preliminary result)   Collection Time: 11/16/15 12:40 PM  Result Value Ref Range Status   Specimen Description FLUID RIGHT PLEURAL  Final   Special Requests BOTTLES DRAWN AEROBIC AND ANAEROBIC 3CC  Final   Culture NO GROWTH 3 DAYS  Final   Report Status PENDING  Incomplete  Gram stain     Status: None   Collection Time: 11/16/15 12:40 PM  Result Value Ref Range Status   Specimen Description FLUID RIGHT PLEURAL  Final   Special Requests NONE  Final   Gram Stain   Final    ABUNDANT WBC PRESENT,BOTH PMN AND MONONUCLEAR NO ORGANISMS SEEN    Report Status 11/16/2015 FINAL  Final  Culture, body fluid-bottle     Status: None (Preliminary result)   Collection Time: 11/19/15 12:44 PM  Result Value Ref Range Status   Specimen Description FLUID PERITONEAL  Final   Special Requests BOTTLES DRAWN AEROBIC AND ANAEROBIC 8CC  Final   Culture PENDING  Incomplete   Report Status PENDING  Incomplete  Gram stain     Status: None   Collection Time: 11/19/15 12:44 PM  Result Value Ref Range Status   Specimen Description FLUID PERITONEAL  Final   Special Requests NONE  Final   Gram Stain   Final    WBC PRESENT,BOTH PMN AND MONONUCLEAR NO ORGANISMS SEEN CYTOSPIN SMEAR    Report Status 11/19/2015 FINAL  Final      11/19/2015, 5:50 PM     LOS: 7 days    Records and images were personally reviewed where available.

## 2015-11-19 NOTE — Progress Notes (Signed)
Patient ID: Kendra Martinez, female   DOB: [birth year], 82 y.o.   MRN: [id number] S: Feels a little better after paracentesis O:BP 118/80 mmHg  Pulse 90  Temp(Src) 97.8 F (36.6 C) (Axillary)  Resp 20  Ht 5' 3"  (1.6 m)  Wt 121.11 kg (267 lb)  BMI 47.31 kg/m2  SpO2 97%  Intake/Output Summary (Last 24 hours) at 11/19/15 1209 Last data filed at 11/19/15 1034  Gross per 24 hour  Intake 661.68 ml  Output    150 ml  Net 511.68 ml   Intake/Output: I/O last 3 completed shifts: In: 1096.9 [P.O.:420; I.V.:626.9; IV Piggyback:50] Out: 651 [Urine:650; Stool:1]  Intake/Output this shift:  Total I/O In: 120 [P.O.:120] Out: -  Weight change: -1.089 kg (-2 lb 6.4 oz) WTU:UEKCM elderly, pale WF in NAD CVS:no rub Resp:decreased BS at bases KLK:JZPHXTAVW, obese, nontender Ext: 1+ edema   Recent Labs Lab 11/12/15 2330 11/13/15 0440 11/14/15 0622 11/15/15 0346 11/16/15 0359 11/17/15 0218 11/18/15 0606 11/19/15 0418  NA 133* 132* 133* 136 137 133* 136 136  K 4.4 4.1 4.4 3.9 4.1 4.3 4.1 4.3  CL 97* 96* 94* 97* 99* 99* 97* 98*  CO2 22 19* 21* 21* 22 17* 21* 23  GLUCOSE 97 116* 109* 103* 103* 108* 109* 103*  BUN 52* 52* 63* 65* 70* 76* 84* 92*  CREATININE 2.37* 2.47* 2.74* 2.73* 2.87* 2.84* 3.09* 3.44*  ALBUMIN 2.2* 2.3*  --   --  2.0*  --   --   --   CALCIUM 7.9* 8.0* 8.1* 8.0* 8.2* 8.2* 8.5* 8.4*  PHOS  --  3.9  --   --   --   --   --   --   AST 21 25  --   --  21  --   --   --   ALT 15 18  --   --  15  --   --   --    Liver Function Tests:  Recent Labs Lab 11/12/15 2330 11/13/15 0440 11/16/15 0359  AST 21 25 21   ALT 15 18 15   ALKPHOS 55 59 56  BILITOT 1.1 1.4* 1.0  PROT 5.2* 5.3* 5.3*  5.2*  ALBUMIN 2.2* 2.3* 2.0*   No results for input(s): LIPASE, AMYLASE in the last 168 hours.  Recent Labs Lab 11/19/15 0418  AMMONIA 33   CBC:  Recent Labs Lab 11/12/15 1619  11/15/15 0346 11/16/15 0359 11/17/15 0218 11/18/15 0606 11/19/15 0418  WBC 22.3*  < > 18.8* 15.6*  18.0* 17.6* 16.4*  NEUTROABS 19.9*  --   --   --   --   --   --   HGB 13.0  < > 11.7* 12.0 12.1 12.2 12.1  HCT 39.2  < > 36.1 36.8 36.3 37.3 37.3  MCV 92.7  < > 92.3 92.5 92.8 93.0 93.3  PLT 219  < > 223 229 236 244 229  < > = values in this interval not displayed. Cardiac Enzymes:  Recent Labs Lab 11/12/15 2330 11/13/15 0440 11/13/15 1120  TROPONINI 0.11* 0.08* 0.07*   CBG: No results for input(s): GLUCAP in the last 168 hours.  Iron Studies: No results for input(s): IRON, TIBC, TRANSFERRIN, FERRITIN in the last 72 hours. Studies/Results: Dg Abd Portable 2v  11/18/2015  CLINICAL DATA:  Abdominal pain EXAM: PORTABLE ABDOMEN - 2 VIEW COMPARISON:  11/12/2015 abdominal radiograph FINDINGS: Moderate gaseous distention of the stomach. No dilated small bowel loops or air-fluid levels. Mild stool throughout the colon.  No evidence of pneumatosis or pneumoperitoneum. Marked degenerative changes in the visualized thoracolumbar spine. Cholecystectomy clips are seen in the right upper quadrant of the abdomen. IMPRESSION: 1. Nonspecific moderate gaseous distention of the stomach . 2. No evidence of small or large bowel obstruction. Electronically Signed   By: Ilona Sorrel M.D.   On: 11/18/2015 14:12   . feeding supplement  1 Container Oral BID BM  . magic mouthwash  5 mL Oral TID  . metoprolol tartrate  12.5 mg Oral BID  . midodrine  5 mg Oral TID WC  . mometasone-formoterol  2 puff Inhalation BID  . ondansetron (ZOFRAN) IV  4 mg Intravenous Once  . sertraline  25 mg Oral Daily  . sodium bicarbonate  650 mg Oral BID  . sodium chloride flush  3 mL Intravenous Q12H  . sodium chloride flush  3 mL Intravenous Q12H    BMET    Component Value Date/Time   NA 136 11/19/2015 0418   K 4.3 11/19/2015 0418   CL 98* 11/19/2015 0418   CO2 23 11/19/2015 0418   GLUCOSE 103* 11/19/2015 0418   BUN 92* 11/19/2015 0418   CREATININE 3.44* 11/19/2015 0418   CALCIUM 8.4* 11/19/2015 0418   GFRNONAA 12*  11/19/2015 0418   GFRAA 14* 11/19/2015 0418   CBC    Component Value Date/Time   WBC 16.4* 11/19/2015 0418   RBC 4.00 11/19/2015 0418   HGB 12.1 11/19/2015 0418   HCT 37.3 11/19/2015 0418   PLT 229 11/19/2015 0418   MCV 93.3 11/19/2015 0418   MCH 30.3 11/19/2015 0418   MCHC 32.4 11/19/2015 0418   RDW 15.2 11/19/2015 0418   LYMPHSABS 1.0 11/12/2015 1619   MONOABS 1.3* 11/12/2015 1619   EOSABS 0.0 11/12/2015 1619   BASOSABS 0.0 11/12/2015 1619   Assessment/Plan:  1. ARF, oliguric in setting of anasarca and likely cirrhosis (as seen on Abdominal US).  FeNa is low consistent with hepatorenal syndrome, no proteinuria, bland urine sediment, normal ESR.  Serologies pending however this is most consistent with hepatorenal syndrome, and therefore dialysis is not indicated as she is not a liver transplant candidate and therefore will not offer HD.  Continue with current level of care and agree with palliative care input. 1. Discussed with family and all are in agreement with no dialysis 2. Will try octreotide and midodrine for probable hepatorenal syndrome and improvement of renal perfusion 2. Hyponatremia- related to Anasarca and ARF.  Improved  3. Anasarca- likely related to cirrhosis.   GI following. 4. CHF- ECHO with EF 45-50%.  5. Disposition- poor overall prognosis.  Agree with Palliative Care to help set goals/limits of care.  She is not a dialysis candidate and would not offer a trial of dialysis given likely hepatorenal syndrome.  [place]

## 2015-11-19 NOTE — Progress Notes (Signed)
Late entry:  Called CT department at 2130 and spoke with Mickel Baas regarding pt CT timing.  Mickel Baas stated she did not have an estimated time.  Pt and husband informed.

## 2015-11-19 NOTE — Progress Notes (Signed)
Daily Rounding Note  11/19/2015, 9:48 AM  LOS: 7 days   SUBJECTIVE:       300 cc urine 4/23, 500  Cc on 4/22, none recorded yet today.  See about 50cc yellow urine in foley.   Weight down 2 # in previous 24 hours but up 9# since  Admit 4/17.  BM yesterday.  No nausea.    OBJECTIVE:         Vital signs in last 24 hours:    Temp:  [97.8 F (36.6 C)-98.7 F (37.1 C)] 97.8 F (36.6 C) (04/24 0811) Pulse Rate:  [70-85] 85 (04/24 0459) Resp:  [15-21] 21 (04/24 0459) BP: (105-122)/(70-80) 105/74 mmHg (04/24 0459) SpO2:  [97 %-100 %] 97 % (04/24 0459) Weight:  [121.11 kg (267 lb)] 121.11 kg (267 lb) (04/24 0459) Last BM Date: 11/18/15 Filed Weights   11/17/15 0400 11/18/15 0410 11/19/15 0459  Weight: 122.335 kg (269 lb 11.2 oz) 122.199 kg (269 lb 6.4 oz) 121.11 kg (267 lb)   General: weak, looks ill and exhausted   Heart: RRR Chest: clear bil.  Diminished sounds in bases.  Breathing not labored  Abdomen: obese, soft, not distended.  No obvious ascites or fluid wave.  BS hypoactive but normal timbre.   Extremities: 3+ pitting edema in legs, feet Neuro/Psych:  Alert, oriented x 3.  Moves all 4s.  No gross defects.  Flat affect  Intake/Output from previous day: 04/23 0701 - 04/24 0700 In: 702.1 [P.O.:240; I.V.:462.1] Out: 300 [Urine:300]  Intake/Output this shift:    Lab Results:  Recent Labs  11/17/15 0218 11/18/15 0606 11/19/15 0418  WBC 18.0* 17.6* 16.4*  HGB 12.1 12.2 12.1  HCT 36.3 37.3 37.3  PLT 236 244 229   BMET  Recent Labs  11/17/15 0218 11/18/15 0606 11/19/15 0418  NA 133* 136 136  K 4.3 4.1 4.3  CL 99* 97* 98*  CO2 17* 21* 23  GLUCOSE 108* 109* 103*  BUN 76* 84* 92*  CREATININE 2.84* 3.09* 3.44*  CALCIUM 8.2* 8.5* 8.4*   LFT No results for input(s): PROT, ALBUMIN, AST, ALT, ALKPHOS, BILITOT, BILIDIR, IBILI in the last 72 hours. PT/INR No results for input(s): LABPROT, INR in the  last 72 hours. Hepatitis Panel No results for input(s): HEPBSAG, HCVAB, HEPAIGM, HEPBIGM in the last 72 hours.  Studies/Results: Dg Abd Portable 2v  11/18/2015  CLINICAL DATA:  Abdominal pain EXAM: PORTABLE ABDOMEN - 2 VIEW COMPARISON:  11/12/2015 abdominal radiograph FINDINGS: Moderate gaseous distention of the stomach. No dilated small bowel loops or air-fluid levels. Mild stool throughout the colon. No evidence of pneumatosis or pneumoperitoneum. Marked degenerative changes in the visualized thoracolumbar spine. Cholecystectomy clips are seen in the right upper quadrant of the abdomen. IMPRESSION: 1. Nonspecific moderate gaseous distention of the stomach . 2. No evidence of small or large bowel obstruction. Electronically Signed   By: Ilona Sorrel M.D.   On: 11/18/2015 14:12   Scheduled Meds: . feeding supplement  1 Container Oral BID BM  . magic mouthwash  5 mL Oral TID  . metoprolol tartrate  12.5 mg Oral BID  . midodrine  5 mg Oral TID WC  . mometasone-formoterol  2 puff Inhalation BID  . ondansetron (ZOFRAN) IV  4 mg Intravenous Once  . sertraline  25 mg Oral Daily  . sodium bicarbonate  650 mg Oral BID  . sodium chloride flush  3 mL Intravenous Q12H  . sodium chloride flush  3 mL Intravenous Q12H   Continuous Infusions: . 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   *  "massive" fluid overload, acute on chronic diastolic HF.  Thoracentesis aborted due to acute intraprocedure dizziness.  Paracentesis planned. Cardiologist Dr Radford Pax veering to palliative care approach, family would like more definitive dx.   Pt is full code.    *  Cystic liver masses.  CT of 08/2015:  Peripheral liver cystic lesions vs perihepatic fluid  *   AKI.   Oliguria. ? Hepatorenal and ? Cardiorenal syndrome.  Renal MD also pushing for palliative care approach.        PLAN   *  Paracentesis ordered, next up for procedure.    Kendra Martinez  11/19/2015, 9:48 AM Pager: [phone]  GI ATTENDING  Interval history data reviewed. Case discussed with Dr. Carlean Purl.Agree with interval progress note as outlined. Patient having paracentesis performed. Her overall picture appears to be a constellation of problems. She does have a history of heart failure. Now with renal insufficiency.The question of cirrhosis raised secondary to imaging findings and ascites. If she has hepatic cirrhosis may be secondary to NASH and/or cardiac congestion. In any event management of her volume is complicated by renal disease. Await results of paracentesis. Not clear how important it is to workup hepatic cysts given her age and comorbidities. Will follow  Docia Chuck. Geri Seminole., M.D. [facility] Division of Gastroenterology

## 2015-11-20 ENCOUNTER — Inpatient Hospital Stay (HOSPITAL_COMMUNITY): Payer: Medicare Other

## 2015-11-20 DIAGNOSIS — R140 Abdominal distension (gaseous): Secondary | ICD-10-CM

## 2015-11-20 DIAGNOSIS — Z66 Do not resuscitate: Secondary | ICD-10-CM

## 2015-11-20 DIAGNOSIS — Z515 Encounter for palliative care: Secondary | ICD-10-CM

## 2015-11-20 LAB — CBC
HCT: 38 % (ref 36.0–46.0)
HEMOGLOBIN: 12.3 g/dL (ref 12.0–15.0)
MCH: 30.1 pg (ref 26.0–34.0)
MCHC: 32.4 g/dL (ref 30.0–36.0)
MCV: 93.1 fL (ref 78.0–100.0)
Platelets: 283 10*3/uL (ref 150–400)
RBC: 4.08 MIL/uL (ref 3.87–5.11)
RDW: 15.5 % (ref 11.5–15.5)
WBC: 16.7 10*3/uL — ABNORMAL HIGH (ref 4.0–10.5)

## 2015-11-20 LAB — RENAL FUNCTION PANEL
Albumin: 2 g/dL — ABNORMAL LOW (ref 3.5–5.0)
Anion gap: 16 — ABNORMAL HIGH (ref 5–15)
BUN: 95 mg/dL — ABNORMAL HIGH (ref 6–20)
CHLORIDE: 97 mmol/L — AB (ref 101–111)
CO2: 20 mmol/L — ABNORMAL LOW (ref 22–32)
CREATININE: 3.37 mg/dL — AB (ref 0.44–1.00)
Calcium: 8.4 mg/dL — ABNORMAL LOW (ref 8.9–10.3)
GFR, EST AFRICAN AMERICAN: 14 mL/min — AB (ref >60)
GFR, EST NON AFRICAN AMERICAN: 12 mL/min — AB (ref >60)
Glucose, Bld: 111 mg/dL — ABNORMAL HIGH (ref 65–99)
PHOSPHORUS: 5.9 mg/dL — AB (ref 2.5–4.6)
Potassium: 5.1 mmol/L (ref 3.5–5.1)
Sodium: 133 mmol/L — ABNORMAL LOW (ref 135–145)

## 2015-11-20 LAB — PROTEIN ELECTROPHORESIS, SERUM
A/G RATIO SPE: 1 (ref 0.7–1.7)
ALBUMIN ELP: 2.9 g/dL (ref 2.9–4.4)
ALPHA-1-GLOBULIN: 0.4 g/dL (ref 0.0–0.4)
Alpha-2-Globulin: 0.9 g/dL (ref 0.4–1.0)
Beta Globulin: 0.8 g/dL (ref 0.7–1.3)
GAMMA GLOBULIN: 0.8 g/dL (ref 0.4–1.8)
GLOBULIN, TOTAL: 2.8 g/dL (ref 2.2–3.9)
Total Protein ELP: 5.7 g/dL — ABNORMAL LOW (ref 6.0–8.5)

## 2015-11-20 LAB — HEPARIN LEVEL (UNFRACTIONATED): HEPARIN UNFRACTIONATED: 0.47 [IU]/mL (ref 0.30–0.70)

## 2015-11-20 MED ORDER — DIATRIZOATE MEGLUMINE & SODIUM 66-10 % PO SOLN
ORAL | Status: AC
  Administered 2015-11-20: 30 mL
  Filled 2015-11-20: qty 30

## 2015-11-20 NOTE — Progress Notes (Signed)
Daily Rounding Note  11/20/2015, 10:34 AM  LOS: 8 days   SUBJECTIVE:       Feels like her breathing is better.  Appetite still poor but improved.  Stools yesterday.  275 ml urine yesterday.   OBJECTIVE:         Vital signs in last 24 hours:    Temp:  [97.8 F (36.6 C)-99.3 F (37.4 C)] 98.5 F (36.9 C) (04/25 0747) Pulse Rate:  [69-83] 69 (04/25 0747) Resp:  [16-20] 17 (04/25 0636) BP: (94-110)/(54-82) 110/54 mmHg (04/25 0747) SpO2:  [91 %-98 %] 98 % (04/25 0938) Weight:  [122.6 kg (270 lb 4.5 oz)] 122.6 kg (270 lb 4.5 oz) (04/25 0500) Last BM Date: 11/19/15 Filed Weights   11/18/15 0410 11/19/15 0459 11/20/15 0500  Weight: 122.199 kg (269 lb 6.4 oz) 121.11 kg (267 lb) 122.6 kg (270 lb 4.5 oz)   General: looks a bit better, less weak.  By no means looks well  .  Weak voice Heart: RRR Chest: clear but diminished bil.  No labored breathing Abdomen: obese, NT.  Active BS.  ND  Extremities: no pedal edema.  Obese in thighs and calves Neuro/Psych:  Oriented x 3.  Appropriate but sleepy.    Intake/Output from previous day: 04/24 0701 - 04/25 0700 In: 1325.1 [P.O.:1080; I.V.:245.1] Out: 275 [Urine:275]  Intake/Output this shift: Total I/O In: 240 [P.O.:240] Out: -   Lab Results:  Recent Labs  11/18/15 0606 11/19/15 0418 11/20/15 0807  WBC 17.6* 16.4* 16.7*  HGB 12.2 12.1 12.3  HCT 37.3 37.3 38.0  PLT 244 229 283   BMET  Recent Labs  11/18/15 0606 11/19/15 0418 11/20/15 0807  NA 136 136 133*  K 4.1 4.3 5.1  CL 97* 98* 97*  CO2 21* 23 20*  GLUCOSE 109* 103* 111*  BUN 84* 92* 95*  CREATININE 3.09* 3.44* 3.37*  CALCIUM 8.5* 8.4* 8.4*   LFT  Recent Labs  11/20/15 0807  ALBUMIN 2.0*   PT/INR No results for input(s): LABPROT, INR in the last 72 hours. Hepatitis Panel No results for input(s): HEPBSAG, HCVAB, HEPAIGM, HEPBIGM in the last 72 hours.  Studies/Results: Ct Abdomen Pelvis Wo  Contrast  11/20/2015  CLINICAL DATA:  Suspected cirrhosis. Ascites. Diffuse abdominal pain. EXAM: CT ABDOMEN AND PELVIS WITHOUT CONTRAST TECHNIQUE: Multidetector CT imaging of the abdomen and pelvis was performed following the standard protocol without IV contrast. COMPARISON:  11/14/2015 abdominal ultrasound. Chest CT of 11/12/2015. FINDINGS: Lower chest: Bibasilar airspace disease, worse on the left. Mild cardiomegaly. Trace left and moderate right pleural effusions. Hepatobiliary: Multifactorial degradation, including lack of IV contrast and patient body habitus. Old granulomatous disease in the liver. Suspicion of cirrhosis on prior ultrasound. Less apparent today onCT. Capsular based low-density foci are likely subcapsular fluid collections. Example straddling the right and left lobe of the liver at 8.1 x 9.5 cm on image 25/series 2. Cholecystectomy, without biliary duct dilatation. Pancreas: Marked pancreatic atrophy without dominant mass or acute inflammation Spleen: Normal in size, without focal abnormality. Adrenals/Urinary Tract: Normal adrenal glands. Mild renal cortical thinning bilaterally. A too small to characterize upper pole left renal lesion. No hydronephrosis. Foley catheter within a decompressed urinary bladder. Stomach/Bowel: Normal stomach, without wall thickening. Scattered colonic diverticula. Normal small bowel caliber. Vascular/Lymphatic: Aortic and branch vessel atherosclerosis. No abdominopelvic adenopathy. Reproductive: Normal uterus. Right adnexal/ovarian low-density lesion is likely a residual follicle at 2.0 cm. Other: Moderate volume abdominal ascites. The abdominal  ascites is loculated, primarily positioned anteriorly (without significant fluid in the paracolic gutters). There is also adjacent omental thickening, including on image 56/series 2. Fluid identified within the lesser sac on image 35/series 2. Anasarca. Musculoskeletal: Advanced lumbosacral spondylosis. IMPRESSION: 1.  Multifactorial degradation, including patient body habitus and lack of IV contrast. 2. Moderate volume ascites, primarily loculated anteriorly. Concurrent omental edema/thickening. Probable cirrhosis. Although ascites could be secondary to portal venous hypertension, given the omental edema, omental/peritoneal metastasis cannot be excluded. Correlate with primary malignancy history and the result of recent paracentesis. 3. Capsular based low-density foci within the liver are likely due to subcapsular fluid collections. Capsular based cysts felt less likely. 4. No definite primary malignancy identified within the abdomen or pelvis. 5. Right larger than left pleural effusions with right base atelectasis and left base infection versus atelectasis. 6. Pancreatic atrophy. Electronically Signed   By: Abigail Miyamoto M.D.   On: 11/20/2015 09:02   US Paracentesis  11/19/2015  INDICATION: Suspected cirrhosis. Chronic diastolic heart failure. Fluid overload. Ascites. Request for diagnostic paracentesis with 250 ml maximum. EXAM: ULTRASOUND GUIDED LEFT ANTERIOR ABDOMEN PARACENTESIS MEDICATIONS: 1% Lidocaine. COMPLICATIONS: None immediate. PROCEDURE: Informed written consent was obtained from the patient after a discussion of the risks, benefits and alternatives to treatment. A timeout was performed prior to the initiation of the procedure. Initial ultrasound scanning demonstrates a large amount of ascites within the right lower abdominal quadrant. The right lower abdomen was prepped and draped in the usual sterile fashion. 1% lidocaine with epinephrine was used for local anesthesia. Following this, a 19 gauge, 7-cm, Yueh catheter was introduced. An ultrasound image was saved for documentation purposes. The paracentesis was performed. The catheter was removed and a dressing was applied. The patient tolerated the procedure well without immediate post procedural complication. FINDINGS: A total of approximately 250 ml of clear  yellow fluid was removed. Samples were sent to the laboratory as requested by the clinical team. IMPRESSION: Successful ultrasound-guided paracentesis yielding 250 ml of peritoneal fluid. Read by:  Gareth Eagle, PA-C Electronically Signed   By: Markus Daft M.D.   On: 11/19/2015 14:42   Dg Abd Portable 2v  11/18/2015  CLINICAL DATA:  Abdominal pain EXAM: PORTABLE ABDOMEN - 2 VIEW COMPARISON:  11/12/2015 abdominal radiograph FINDINGS: Moderate gaseous distention of the stomach. No dilated small bowel loops or air-fluid levels. Mild stool throughout the colon. No evidence of pneumatosis or pneumoperitoneum. Marked degenerative changes in the visualized thoracolumbar spine. Cholecystectomy clips are seen in the right upper quadrant of the abdomen. IMPRESSION: 1. Nonspecific moderate gaseous distention of the stomach . 2. No evidence of small or large bowel obstruction. Electronically Signed   By: Ilona Sorrel M.D.   On: 11/18/2015 14:12   Scheduled Meds: . feeding supplement  1 Container Oral BID BM  . magic mouthwash  5 mL Oral TID  . metoprolol tartrate  12.5 mg Oral BID  . midodrine  10 mg Oral TID WC  . mometasone-formoterol  2 puff Inhalation BID  . octreotide  100 mcg Subcutaneous Q12H  . ondansetron (ZOFRAN) IV  4 mg Intravenous Once  . sertraline  25 mg Oral Daily  . sodium bicarbonate  650 mg Oral BID  . sodium chloride flush  3 mL Intravenous Q12H  . sodium chloride flush  3 mL Intravenous Q12H   Continuous Infusions: . 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  Ct completed this AM: Degraded study.  Moderate ascites, probable cirrhosis.  Liver lesions: subcapsular fluid collections.    * "massive" fluid overload, acute on chronic  diastolic HF.  Thoracentesis 4/21: transudative and borderline exudative pleural effusion.  High WBC count: ? parapneumonic effusion.  Cardiologist Dr Radford Pax veering to palliative care approach, family would like more definitive dx. Pt now DNR.  Finished abx (4/7 - 4/20)  CT today with bil pleural effusions.    *  Ascites, anasarca.  250 ml tap 4/24: WBCs 356, SAG: 0.4, not c/w portal htn.   * Cystic liver masses. CT of 08/2015: Peripheral liver cystic lesions vs perihepatic fluid.  Per CT, likely this is sub-capsular fluid from fluid overload.   *  New onset a fib.  On heparin gtt.   * AKI. Oliguria. ? Hepatorenal and ? Cardiorenal syndrome.     PLAN   *  No changes to plan.  Dr Henrene Pastor to follow(see note below).    Azucena Freed  11/20/2015, 10:34 AM Pager: [phone]  GI ATTENDING  Interval history data reviewed. Patient seen and examined. Progress notes from cardiology and nephrology reviewed. Of interest, analysis of the patient's ascitic fluid does not support cirrhosis (SAG << 1.1) as the primary cause for her ascites. She has multiple issues that can lead to fluid overload including obvious problems with heart failure and active renal insufficiency (primary or secondary). The evidence for hepatic cirrhosis is weak and is limited to the suggestion of cirrhosis on one ultrasound (but not others) in the presence of ascites. Subsequent Noncontrast CT not supportive of cirrhosis or portal hypertension. Furthermore, liver tests, platelets, and prothrombin time are normal. Thus, if she were to have some element of cirrhosis, it would be quite mild. I recommend focusing on areas other than the liver regarding her anasarca and ascites. No further recommendations. We're available if needed for questions, but this point will sign off. Thank you  Docia Chuck. Geri Seminole., M.D. [facility] Division of Gastroenterology

## 2015-11-20 NOTE — Progress Notes (Signed)
Occupational Therapy Treatment Patient Details Name: Kendra Martinez MRN: [id number] DOB: [birth year] Today's Date: 11/20/2015    History of present illness This 82 y.o. female admitted wit SOB and generalized weakness.   Dx; Rt pleural effusion of unclear etiology, recently diagnosed Lt heart failure and HTN, as well as sepsis.  Paracenthesis 4/24 PMH includes: Bipolar disorder   OT comments  Pt continues to demonstrate poor activity tolerance with high anxiety with bed level mobility and sitting at EOB. Tolerated approximately 10 minutes with VSS, but pt requesting to return to supine. Educated pt and family in importance of activity with therapy and for pt to self feed and participate in bed positioning and her care to increase endurance and strength. Will continue to follow.  Follow Up Recommendations  SNF;Supervision/Assistance - 24 hour    Equipment Recommendations       Recommendations for Other Services      Precautions / Restrictions Precautions Precautions: Fall Precaution Comments: pt is very weak Restrictions Weight Bearing Restrictions: No       Mobility Bed Mobility Overal bed mobility: Needs Assistance;+2 for physical assistance Bed Mobility: Supine to Sit;Sit to Supine     Supine to sit: +2 for physical assistance;Total assist Sit to supine: +2 for physical assistance;Total assist   General bed mobility comments: assist with all aspects, step by step cues for sequence  Transfers                      Balance Overall balance assessment: Needs assistance Sitting-balance support: Single extremity supported Sitting balance-Leahy Scale: Poor Sitting balance - Comments: min guard assist at EOB, tolerated x 10 minutes. Reported dizziness intially, VSS.                           ADL                                         General ADL Comments: Instructed pt and family that pt needs to feed and groom herself at bed level.        Vision                     Perception     Praxis      Cognition   Behavior During Therapy: Anxious Overall Cognitive Status: Within Functional Limits for tasks assessed                       Extremity/Trunk Assessment               Exercises     Shoulder Instructions       General Comments      Pertinent Vitals/ Pain       Pain Assessment: Faces Faces Pain Scale: Hurts little more Pain Location: LEs Pain Descriptors / Indicators: Grimacing;Sore Pain Intervention(s): Monitored during session;Repositioned  Home Living                                          Prior Functioning/Environment              Frequency Min 2X/week     Progress Toward Goals  OT Goals(current goals can now be found in the care plan section)  Progress towards OT goals: Not progressing toward goals - comment (very weak, poor activity tolerance)  Acute Rehab OT Goals Time For Goal Achievement: 11/29/15 Potential to Achieve Goals: [place] Discharge plan remains appropriate    Co-evaluation    PT/OT/SLP Co-Evaluation/Treatment: Yes            End of Session Equipment Utilized During Treatment: Oxygen   Activity Tolerance Patient limited by fatigue (anxious)   Patient Left in bed;with call bell/phone within reach;with family/visitor present   Nurse Communication          Time: [id number] OT Time Calculation (min): 18 min  Charges: OT General Charges $OT Visit: 1 Procedure OT Treatments $Therapeutic Activity: 8-22 mins  [name] 11/20/2015, 11:58 AM  [phone]

## 2015-11-20 NOTE — Progress Notes (Signed)
ANTICOAGULATION CONSULT NOTE - Follow Up Consult  Pharmacy Consult:  Heparin Indication: atrial fibrillation  Allergies  Allergen Reactions  . Sulfa Antibiotics Hives    Patient Measurements: Height: 5\' 3"  (160 cm) Weight: 270 lb 4.5 oz (122.6 kg) IBW/kg (Calculated) : 52.4 Heparin Dosing Weight: 82 kg  Vital Signs: Temp: 98.5 F (36.9 C) (04/25 0747) Temp Source: Axillary (04/25 0747) BP: 110/54 mmHg (04/25 0747) Pulse Rate: 69 (04/25 0747)  Labs:  Recent Labs  11/18/15 0606 11/19/15 0418 11/20/15 0807  HGB 12.2 12.1 12.3  HCT 37.3 37.3 38.0  PLT 244 229 283  HEPARINUNFRC 0.56 0.60 0.47  CREATININE 3.09* 3.44* 3.37*    Estimated Creatinine Clearance: 17.2 mL/min (by C-G formula based on Cr of 3.37).   Assessment: 43 YOF with new-onset AFib.  HL therapeutic.  Goal of Therapy:  Heparin level 0.3-0.7 units/ml Monitor platelets by anticoagulation protocol: Yes    Plan:  - Continue heparin infusion at 1050 units/hr - Daily HL, CBC - Monitor s/s of bleeding  Thank you Anette Guarneri, PharmD [phone] 11/20/2015 11:42 AM

## 2015-11-20 NOTE — Consult Note (Signed)
Consultation Note Date: 11/20/2015   Patient Name: Kendra Martinez  DOB: 1933-09-12  MRN: [id number]  Age / Sex: 82 y.o., female  PCP: No Pcp Per Patient Referring Physician: Bonnielee Haff, MD  Reason for Consultation: Establishing goals of care and Psychosocial/spiritual support    Clinical Assessment/Narrative:   Kendra Martinez is a 82 y.o. female. Pt relocated [place]. Morbidly obese. Hx CHF. HTN. Bipolar disorder. GERD.   ~ 2 weeks ago outside hospital admission with new dx of CHF, PNA and UTI. After that relocated to [facility] Was ambulating with walker at discharge but now with recurrence of same sxs present before previous admission.   Came to ED with progressive dyspnea, LE edema, weakness. Clinically has anasarca.  Right pleural effusion, large ascites on xray.  Afib, rate low 100s on ekg.  EF 45 to 50% by echo. No diastolic dysfunction.  Troponin to0.11, 0.08, 0.07.  AKI vs CKD stage 4 (GFR 53 in 08/2015). WBCs 18 K. 60 K yeast on urine clx.   Ultrasound liver: 2 large, left, liver cysts, nodular liver, moderate ascites.  On chest CT the ascites and liver cysts also seen.  lfts normal aexcept low albumin and prealbumin Platelets ok. coags 19/1.5.   Continued physical and functional decleine, now with positive atypical cells in ascites fluid concerning for malignancy    Faced with treatment options decisions and advanced directive decisions and EOL decisions.  Adiscussion was had today regarding importance of advanced directives.    The difference between a aggressive medical intervention path  and a palliative comfort care path for this patient at this time was had.   Concept of Hospice and Palliative Care were discussed    Values and goals of care important to patient and family were attempted to be elicited.   Questions and concerns addressed.   Family encouraged to  call with questions or concerns.  PMT will continue to support holistically.   SUMMARY OF RECOMMENDATIONS  Code Status/Advance Care Planning: DNR    Code Status Orders        Start     Ordered   11/19/15 [id number]  Do not attempt resuscitation (DNR)   Continuous    Question Answer Comment  Maintain current active treatments Yes   Do not initiate new interventions No      11/19/15 1156    Code Status History    Date Active Date Inactive Code Status Order ID Comments User Context   11/13/2015  3:36 AM 11/19/2015 11:56 AM Full Code [id number]  Toy Baker, MD ED    Advance Directive Documentation        Most Recent Value   Type of Advance Directive  Living will   Pre-existing out of facility DNR order (yellow form or pink MOST form)     "MOST" Form in Place?        Other Directives:None   Palliative Prophylaxis:   Aspiration, Bowel Regimen, Delirium Protocol, Frequent Pain Assessment, Oral Care and Turn Reposition  Additional Recommendations (Limitations, Scope, Preferences):  *  Patient and family are open to all offered and available medical interventions to prolong life  Psycho-social/Spiritual:  Support System: Strong   Prognosis:  Overall prognosis is poor  Discharge Planning: Pending   Chief Complaint/ Primary Diagnoses: Present on Admission:  . Pleural effusion . Essential hypertension . Hyponatremia . Sepsis ([place]) . Anasarca . New onset a-fib ([place]) . Elevated troponin  I have reviewed the medical record, interviewed the patient and family,  and examined the patient. The following aspects are pertinent.  Past Medical History  Diagnosis Date  . CHF (congestive heart failure) ([place])   . Hypertension   . Pleural effusion 10/2015  . Bipolar disorder ([place])   . GERD (gastroesophageal reflux disease)   . Ascites   . Arthritis     oa in knees   Social History   Social History  . Marital Status: Married    Spouse Name: N/A  . Number of Children:  N/A  . Years of Education: N/A   Social History Main Topics  . Smoking status: Never Smoker   . Smokeless tobacco: Never Used  . Alcohol Use: No  . Drug Use: No  . Sexual Activity: Not Asked   Other Topics Concern  . None   Social History Narrative   Family History  Problem Relation Age of Onset  . Kidney cancer Mother   . COPD Father   . Diabetes type II Other   . Stroke Neg Hx    Scheduled Meds: . feeding supplement  1 Container Oral BID BM  . magic mouthwash  5 mL Oral TID  . metoprolol tartrate  12.5 mg Oral BID  . midodrine  10 mg Oral TID WC  . mometasone-formoterol  2 puff Inhalation BID  . octreotide  100 mcg Subcutaneous Q12H  . ondansetron (ZOFRAN) IV  4 mg Intravenous Once  . sertraline  25 mg Oral Daily  . sodium bicarbonate  650 mg Oral BID  . sodium chloride flush  3 mL Intravenous Q12H  . sodium chloride flush  3 mL Intravenous Q12H   Continuous Infusions: . heparin 1,050 Units/hr (11/19/15 1946)   PRN Meds:.sodium chloride, acetaminophen **OR** acetaminophen, ALPRAZolam, HYDROcodone-acetaminophen, ondansetron **OR** ondansetron (ZOFRAN) IV, prochlorperazine, sodium chloride flush, traMADol Medications Prior to Admission:  Prior to Admission medications   Medication Sig Start Date End Date Taking? Authorizing Provider  albuterol (PROVENTIL HFA;VENTOLIN HFA) 108 (90 Base) MCG/ACT inhaler Inhale 1-2 puffs into the lungs every 4 (four) hours as needed for wheezing or shortness of breath.   Yes Historical Provider, MD  ALPRAZolam (XANAX) 0.25 MG tablet Take 0.25 mg by mouth 3 (three) times daily as needed for anxiety.   Yes Historical Provider, MD  fluticasone furoate-vilanterol (BREO ELLIPTA) 100-25 MCG/INH AEPB Inhale 1 puff into the lungs daily.   Yes Historical Provider, MD  metoprolol tartrate (LOPRESSOR) 25 MG tablet Take 25 mg by mouth 2 (two) times daily.   Yes Historical Provider, MD  mometasone-formoterol (DULERA) 200-5 MCG/ACT AERO Inhale 2 puffs  into the lungs 2 (two) times daily.   Yes Historical Provider, MD  omeprazole (PRILOSEC) 20 MG capsule Take 20 mg by mouth daily.   Yes Historical Provider, MD  ondansetron (ZOFRAN) 4 MG tablet Take 4 mg by mouth daily as needed for nausea.   Yes Historical Provider, MD  promethazine (PHENERGAN) 25 MG tablet Take 25 mg by mouth every 8 (eight) hours as needed for nausea or vomiting.   Yes Historical Provider, MD  sertraline (ZOLOFT) 25 MG tablet Take 25 mg by mouth daily.   Yes Historical Provider, MD  tiotropium (SPIRIVA) 18 MCG inhalation capsule Place 18 mcg into inhaler and inhale daily.   Yes Historical Provider, MD  Tiotropium Bromide Monohydrate (SPIRIVA RESPIMAT) 2.5 MCG/ACT AERS Inhale 2 puffs into the lungs daily.    Yes Historical Provider, MD  traMADol (ULTRAM) 50 MG tablet Take 50 mg by mouth every 12 (twelve) hours as needed for moderate  pain.   Yes Historical Provider, MD   Allergies  Allergen Reactions  . Sulfa Antibiotics Hives    Review of Systems  Unable to perform ROS: Acuity of condition    Physical Exam  Constitutional: She appears lethargic. She appears ill.  Cardiovascular: Normal rate, regular rhythm and normal heart sounds.   Respiratory: She has decreased breath sounds.  Musculoskeletal:  -generalize weakness, gross anasarca   Neurological: She appears lethargic.  Skin: Skin is warm and dry.    Vital Signs: BP 126/82 mmHg  Pulse 74  Temp(Src) 97.7 F (36.5 C) (Oral)  Resp 26  Ht 5\' 3"  (1.6 m)  Wt 122.6 kg (270 lb 4.5 oz)  BMI 47.89 kg/m2  SpO2 94%  SpO2: SpO2: 94 % O2 Device:SpO2: 94 % O2 Flow Rate: .O2 Flow Rate (L/min): 1 L/min  IO: Intake/output summary:  Intake/Output Summary (Last 24 hours) at 11/20/15 1312 Last data filed at 11/20/15 0947  Gross per 24 hour  Intake 1325.13 ml  Output    275 ml  Net 1050.13 ml    LBM: Last BM Date: 11/19/15 Baseline Weight: Weight: 117.028 kg (258 lb) Most recent weight: Weight: 122.6 kg (270 lb 4.5  oz)      Palliative Assessment/Data:    Additional Data Reviewed:  CBC:    Component Value Date/Time   WBC 16.7* 11/20/2015 0807   HGB 12.3 11/20/2015 0807   HCT 38.0 11/20/2015 0807   PLT 283 11/20/2015 0807   MCV 93.1 11/20/2015 0807   NEUTROABS 19.9* 11/12/2015 1619   LYMPHSABS 1.0 11/12/2015 1619   MONOABS 1.3* 11/12/2015 1619   EOSABS 0.0 11/12/2015 1619   BASOSABS 0.0 11/12/2015 1619   Comprehensive Metabolic Panel:    Component Value Date/Time   NA 133* 11/20/2015 0807   K 5.1 11/20/2015 0807   CL 97* 11/20/2015 0807   CO2 20* 11/20/2015 0807   BUN 95* 11/20/2015 0807   CREATININE 3.37* 11/20/2015 0807   GLUCOSE 111* 11/20/2015 0807   CALCIUM 8.4* 11/20/2015 0807   AST 21 11/16/2015 0359   ALT 15 11/16/2015 0359   ALKPHOS 56 11/16/2015 0359   BILITOT 1.0 11/16/2015 0359   PROT 5.2* 11/16/2015 0359   PROT 5.3* 11/16/2015 0359   ALBUMIN 2.0* 11/20/2015 0807   Discussed with Dr [place] Pink  Time In: 0900 Time Out: 1000 Time Total: 60 min min Greater than 50%  of this time was spent counseling and coordinating care related to the above assessment and plan.  Signed by: Wadie Lessen, NP  Knox Royalty, NP  11/20/2015, 1:12 PM  Please contact Palliative Medicine Team phone at [phone] for questions and concerns.

## 2015-11-20 NOTE — Consult Note (Signed)
[place]  [phone]   HEMATOLOGY ONCOLOGY INPATIENT CONSULTATION   Kendra Martinez  DOB: 12-06-1933  MR#: [id number]  CSN#: [id number]    Requesting Physician: [facility] Dr. Maryland Pink   Patient Care Team: No Pcp Per Patient as PCP - General (General Practice)  Reason for consult: atypical cells in ascites, suspicion for metastatic malignancy   History of present illness:   Kendra Martinez is a 82 yo female, with PMH of asthma, HNT and arthritis, who was admitted to [facility] on 4/17 for worsening dyspnea. She was recently admitted to outside hospital for worsening short of breast, right pleural effusion, and was diagnosed with decompensated CHF, and pneumonia. She was found to have large ascites, bilateral pleural effusion, acute on chronic renal failure, and decompensated heart failure. She has been seen by multiple specialties in the hospital, including pulmonary, GI, renal, cardiology, and infection disease. He underwent paracentesis and thoracentesis. Cytology from paracentesis showed atypical cells, suspicious for malignancy. Patient currently feels very weak, has hoarseness, dyspnea on minimal exertion, and abdominal bloating. I spoke with pt, her husband and son at bedside.   MEDICAL HISTORY:  Past Medical History  Diagnosis Date  . CHF (congestive heart failure) ([place])   . Hypertension   . Pleural effusion 10/2015  . Bipolar disorder ([place])   . GERD (gastroesophageal reflux disease)   . Ascites   . Arthritis     oa in knees    SURGICAL HISTORY: Past Surgical History  Procedure Laterality Date  . Cholecystectomy      SOCIAL HISTORY: Social History   Social History  . Marital Status: Married    Spouse Name: N/A  . Number of Children: N/A  . Years of Education: N/A   Occupational History  . Not on file.   Social History Main Topics  . Smoking status: Never Smoker   . Smokeless tobacco: Never Used  . Alcohol Use: No  . Drug Use: No  .  Sexual Activity: Not on file   Other Topics Concern  . Not on file   Social History Narrative    FAMILY HISTORY: Family History  Problem Relation Age of Onset  . Kidney cancer Mother   . COPD Father   . Diabetes type II Other   . Stroke Neg Hx     ALLERGIES:  is allergic to sulfa antibiotics.  MEDICATIONS:  Current Facility-Administered Medications  Medication Dose Route Frequency Provider Last Rate Last Dose  . 0.9 %  sodium chloride infusion  250 mL Intravenous PRN Toy Baker, MD 10 mL/hr at 11/20/15 0020 250 mL at 11/20/15 0020  . acetaminophen (TYLENOL) tablet 650 mg  650 mg Oral Q6H PRN Toy Baker, MD   650 mg at 11/15/15 2324   Or  . acetaminophen (TYLENOL) suppository 650 mg  650 mg Rectal Q6H PRN Toy Baker, MD      . ALPRAZolam Duanne Moron) tablet 0.25 mg  0.25 mg Oral TID PRN Toy Baker, MD   0.25 mg at 11/14/15 2214  . feeding supplement (BOOST / RESOURCE BREEZE) liquid 1 Container  1 Container Oral BID BM Kimberly B Hammons, RPH   1 Container at 11/15/15 1740  . heparin ADULT infusion 100 units/mL (25000 units/250 mL)  1,050 Units/hr Intravenous Continuous Tyrone Apple, RPH 10.5 mL/hr at 11/19/15 1946 1,050 Units/hr at 11/19/15 1946  . HYDROcodone-acetaminophen (NORCO/VICODIN) 5-325 MG per tablet 1-2 tablet  1-2 tablet Oral Q4H PRN Toy Baker, MD   1 tablet at 11/20/15  76  . magic mouthwash  5 mL Oral TID Margarita Mail, PA-C   5 mL at 11/20/15 1703  . metoprolol tartrate (LOPRESSOR) tablet 12.5 mg  12.5 mg Oral BID Isaiah Serge, NP   12.5 mg at 11/20/15 0944  . midodrine (PROAMATINE) tablet 10 mg  10 mg Oral TID WC Donato Heinz, MD   10 mg at 11/20/15 1702  . mometasone-formoterol (DULERA) 200-5 MCG/ACT inhaler 2 puff  2 puff Inhalation BID Toy Baker, MD   2 puff at 11/20/15 0937  . octreotide (SANDOSTATIN) injection 100 mcg  100 mcg Subcutaneous Q12H Donato Heinz, MD   100 mcg at 11/20/15 0509  . ondansetron  (ZOFRAN) injection 4 mg  4 mg Intravenous Once Margarita Mail, PA-C   4 mg at 11/13/15 0411  . ondansetron (ZOFRAN) tablet 4 mg  4 mg Oral Q4H PRN Dianne Dun, NP       Or  . ondansetron [facility]) injection 4 mg  4 mg Intravenous Q4H PRN Dianne Dun, NP   4 mg at 11/17/15 1537  . prochlorperazine (COMPAZINE) injection 5 mg  5 mg Intravenous Q4H PRN Bonnielee Haff, MD   5 mg at 11/19/15 0442  . sertraline (ZOLOFT) tablet 25 mg  25 mg Oral Daily Toy Baker, MD   25 mg at 11/20/15 0943  . sodium bicarbonate tablet 650 mg  650 mg Oral BID Bonnielee Haff, MD   650 mg at 11/20/15 0944  . sodium chloride flush (NS) 0.9 % injection 3 mL  3 mL Intravenous Q12H Toy Baker, MD   3 mL at 11/16/15 2248  . sodium chloride flush (NS) 0.9 % injection 3 mL  3 mL Intravenous Q12H Toy Baker, MD   3 mL at 11/16/15 2248  . sodium chloride flush (NS) 0.9 % injection 3 mL  3 mL Intravenous PRN Toy Baker, MD      . traMADol (ULTRAM) tablet 50 mg  50 mg Oral Q12H PRN Toy Baker, MD        REVIEW OF SYSTEMS:   Constitutional: Denies fevers, chills or abnormal night sweats Eyes: Denies blurriness of vision, double vision or watery eyes Ears, nose, mouth, throat, and face: Denies mucositis or sore throat Respiratory: Denies cough, dyspnea or wheezes Cardiovascular: Denies palpitation, chest discomfort or lower extremity swelling Gastrointestinal:  Denies nausea, heartburn or change in bowel habits Skin: Denies abnormal skin rashes Lymphatics: Denies new lymphadenopathy or easy bruising Neurological:Denies numbness, tingling or new weaknesses Behavioral/Psych: Mood is stable, no new changes  All other systems were reviewed with the patient and are negative.  PHYSICAL EXAMINATION: ECOG PERFORMANCE STATUS: 4 - Bedbound  Filed Vitals:   11/20/15 1239 11/20/15 1704  BP: 126/82 104/64  Pulse: 74 76  Temp: 97.7 F (36.5 C) 97.7 F (36.5 C)  Resp:      Filed Weights   11/18/15 0410 11/19/15 0459 11/20/15 0500  Weight: 269 lb 6.4 oz (122.199 kg) 267 lb (121.11 kg) 270 lb 4.5 oz (122.6 kg)    GENERAL:alert, chronic ill appearing, no distress and comfortable SKIN: skin color, texture, turgor are normal, no rashes or significant lesions EYES: normal, conjunctiva are pink and non-injected, sclera clear OROPHARYNX:no exudate, no erythema and lips, buccal mucosa, and tongue normal  NECK: supple, thyroid normal size, non-tender, without nodularity LYMPH:  no palpable lymphadenopathy in the cervical, axillary or inguinal LUNGS: clear to auscultation and percussion with normal breathing effort HEART: regular rate & rhythm and no murmurs and no lower  extremity edema ABDOMEN:abdomen soft, non-tender and normal bowel sounds, no organmegaly  Musculoskeletal:no cyanosis of digits and no clubbing  PSYCH: alert & oriented x 3 with fluent speech NEURO: no focal motor/sensory deficits Breast: Bilateral breast exam and axillary exam reviewed no palpable mass or adenopathy  LABORATORY DATA:  I have reviewed the data as listed Lab Results  Component Value Date   WBC 16.7* 11/20/2015   HGB 12.3 11/20/2015   HCT 38.0 11/20/2015   MCV 93.1 11/20/2015   PLT 283 11/20/2015    Recent Labs  11/12/15 2330 11/13/15 0440  11/16/15 0359  11/18/15 0606 11/19/15 0418 11/20/15 0807  NA 133* 132*  < > 137  < > 136 136 133*  K 4.4 4.1  < > 4.1  < > 4.1 4.3 5.1  CL 97* 96*  < > 99*  < > 97* 98* 97*  CO2 22 19*  < > 22  < > 21* 23 20*  GLUCOSE 97 116*  < > 103*  < > 109* 103* 111*  BUN 52* 52*  < > 70*  < > 84* 92* 95*  CREATININE 2.37* 2.47*  < > 2.87*  < > 3.09* 3.44* 3.37*  CALCIUM 7.9* 8.0*  < > 8.2*  < > 8.5* 8.4* 8.4*  GFRNONAA 18* 17*  < > 15*  < > 13* 12* 12*  GFRAA 21* 20*  < > 17*  < > 15* 14* 14*  PROT 5.2* 5.3*  --  5.3*  5.2*  --   --   --   --   ALBUMIN 2.2* 2.3*  --  2.0*  --   --   --  2.0*  AST 21 25  --  21  --   --   --   --    ALT 15 18  --  15  --   --   --   --   ALKPHOS 55 59  --  56  --   --   --   --   BILITOT 1.1 1.4*  --  1.0  --   --   --   --   < > = values in this interval not displayed.  Cytology  Diagnosis 11/16/2015 PLEURAL FLUID, RIGHTT (SPECIMEN 1 OF 1 COLLECTED 11/16/2015) ATYPICAL CELLS. SEE COMMENT. Comment Comment: There are small aggregates of glandular epithelial cells with atypia. Immunohistochemistry shows positivity with cytokeratin 7 and MOC-31 and negative staining with CDX-2, calretinin, estrogen receptor, progesterone receptor, gross cystic disease fluid protein, napsin A, thyroid transcription factor-1 and WT-1. The morphology and immunophenotype are concerning for carcinoma and clinical correlation is suggested.  RADIOGRAPHIC STUDIES: I have personally reviewed the radiological images as listed and agreed with the findings in the report. Ct Abdomen Pelvis Wo Contrast  11/20/2015  CLINICAL DATA:  Suspected cirrhosis. Ascites. Diffuse abdominal pain. EXAM: CT ABDOMEN AND PELVIS WITHOUT CONTRAST TECHNIQUE: Multidetector CT imaging of the abdomen and pelvis was performed following the standard protocol without IV contrast. COMPARISON:  11/14/2015 abdominal ultrasound. Chest CT of 11/12/2015. FINDINGS: Lower chest: Bibasilar airspace disease, worse on the left. Mild cardiomegaly. Trace left and moderate right pleural effusions. Hepatobiliary: Multifactorial degradation, including lack of IV contrast and patient body habitus. Old granulomatous disease in the liver. Suspicion of cirrhosis on prior ultrasound. Less apparent today onCT. Capsular based low-density foci are likely subcapsular fluid collections. Example straddling the right and left lobe of the liver at 8.1 x 9.5 cm on image 25/series 2. Cholecystectomy,  without biliary duct dilatation. Pancreas: Marked pancreatic atrophy without dominant mass or acute inflammation Spleen: Normal in size, without focal abnormality. Adrenals/Urinary  Tract: Normal adrenal glands. Mild renal cortical thinning bilaterally. A too small to characterize upper pole left renal lesion. No hydronephrosis. Foley catheter within a decompressed urinary bladder. Stomach/Bowel: Normal stomach, without wall thickening. Scattered colonic diverticula. Normal small bowel caliber. Vascular/Lymphatic: Aortic and branch vessel atherosclerosis. No abdominopelvic adenopathy. Reproductive: Normal uterus. Right adnexal/ovarian low-density lesion is likely a residual follicle at 2.0 cm. Other: Moderate volume abdominal ascites. The abdominal ascites is loculated, primarily positioned anteriorly (without significant fluid in the paracolic gutters). There is also adjacent omental thickening, including on image 56/series 2. Fluid identified within the lesser sac on image 35/series 2. Anasarca. Musculoskeletal: Advanced lumbosacral spondylosis. IMPRESSION: 1. Multifactorial degradation, including patient body habitus and lack of IV contrast. 2. Moderate volume ascites, primarily loculated anteriorly. Concurrent omental edema/thickening. Probable cirrhosis. Although ascites could be secondary to portal venous hypertension, given the omental edema, omental/peritoneal metastasis cannot be excluded. Correlate with primary malignancy history and the result of recent paracentesis. 3. Capsular based low-density foci within the liver are likely due to subcapsular fluid collections. Capsular based cysts felt less likely. 4. No definite primary malignancy identified within the abdomen or pelvis. 5. Right larger than left pleural effusions with right base atelectasis and left base infection versus atelectasis. 6. Pancreatic atrophy. Electronically Signed   By: Abigail Miyamoto M.D.   On: 11/20/2015 09:02   Dg Chest 2 View  11/14/2015  CLINICAL DATA:  Acute respiratory failure with hypoxemia. EXAM: CHEST  2 VIEW COMPARISON:  November 12, 2015. FINDINGS: Stable cardiomediastinal silhouette. No pneumothorax  is noted. Left lung is clear. Increased right midlung opacity is noted concerning for subsegmental atelectasis mild right pleural effusion remains. Stable presence of calcified loose body in right shoulder area. IMPRESSION: Increased right midlung opacity is noted most consistent with subsegmental atelectasis. Mild right pleural effusion is noted as well. Electronically Signed   By: Marijo Conception, M.D.   On: 11/14/2015 07:40   Dg Chest 2 View  11/12/2015  CLINICAL DATA:  Shortness of breath for 2 weeks. Recent diagnosis of pneumonia. EXAM: CHEST  2 VIEW COMPARISON:  None. FINDINGS: Heart size and pulmonary vascularity are normal. There is calcification in the arch of the aorta. There is a moderate right pleural effusion. Left lung is clear. No acute bone abnormality. Degenerative changes of both shoulders with a large calcified loose body in the right shoulder joint in the subcoracoid recess. IMPRESSION: Moderate right pleural effusion.  Aortic atherosclerosis. Electronically Signed   By: Lorriane Shire M.D.   On: 11/12/2015 16:09   Dg Abd 1 View  11/13/2015  CLINICAL DATA:  Abdominal distention EXAM: ABDOMEN - 1 VIEW COMPARISON:  None. FINDINGS: Mild gastric distention. Nonobstructive bowel gas pattern. Cholecystectomy clips. Degenerative changes of the visualized thoracolumbar spine. IMPRESSION: Unremarkable abdominal radiograph. Electronically Signed   By: Julian Hy M.D.   On: 11/13/2015 00:04   Ct Chest Wo Contrast  11/12/2015  CLINICAL DATA:  Shortness of breath, 2 weeks duration. Treated for pneumonia. Pleural effusion. EXAM: CT CHEST WITHOUT CONTRAST TECHNIQUE: Multidetector CT imaging of the chest was performed following the standard protocol without IV contrast. COMPARISON:  Radiography same day FINDINGS: There is a moderate to large pleural effusion on the right layering dependently. There is dependent atelectasis in the right lung. The aerated portion of the right lung is clear except  for minimal atelectasis  or scarring in the middle lobe. No pleural effusion on the left. There is minimal atelectasis at the left base. The left lung is otherwise clear. No underlying emphysema. No mediastinal or hilar mass or lymphadenopathy. There is atherosclerosis of the aorta and extensive coronary artery calcification. Scans in the upper abdomen show a large amount of ascites. There are at least 2 large liver cysts, not completely evaluated. The larger cyst at the junction of the right and left lobes measures up to 11 cm in diameter. Right lobe cyst partially imaged measures up to 7 cm in diameter. IMPRESSION: Right pleural effusion layering dependently with dependent pulmonary atelectasis. The aerated lung does not show evidence of pre-existing lung disease or active pneumonia. Atherosclerosis of the aorta and the coronary arteries. Large amount of ascites. Two large liver cysts. The abdomen was not completely evaluated. Electronically Signed   By: Nelson Chimes M.D.   On: 11/12/2015 18:30   US Abdomen Complete  11/14/2015  CLINICAL DATA:  Ascites. Evaluate liver. Acute renal failure. History of hypertension and congestive heart failure. History of a cholecystectomy. EXAM: ABDOMEN ULTRASOUND COMPLETE COMPARISON:  11/13/2015 FINDINGS: Gallbladder: Surgically absent. Common bile duct: Diameter: 6 mm Liver: 2 large cystic areas, 1 projecting along the anterior segment right lobe/medial segmental left lobe measuring 8.9 x 7.8 x 10.3 cm. The other arising from the posterior aspect of the right lobe measuring 6.6 x 6.1 x 7.3 cm. Liver demonstrates a coarsened echotexture with a nodular surface contour. Liver normal in overall size. IVC: Limited visualization.  No gross abnormality. Pancreas: Limited visualization.  No gross abnormality. Spleen: Size and appearance within normal limits. Right Kidney: Length: 10.6 cm. Mild renal cortical thinning. Echogenicity within normal limits. No mass or hydronephrosis  visualized. Left Kidney: Length: 10.9 cm. Mild renal cortical thinning. Echogenicity within normal limits. No mass or hydronephrosis visualized. Abdominal aorta: Not well seen from the midportion to the bifurcation. Gross aneurysm. Other findings: Moderate amount of ascites similar to the previous day's study. Right pleural effusion. IMPRESSION: 1. Appearance of the liver is consistent with cirrhosis. There are 2 large with her cystic masses, likely simple hepatic cysts. Consider follow-up liver MRI with and without contrast for further assessment and lesion characterization. 2. No acute findings. 3. Moderate ascites. Electronically Signed   By: Lajean Manes M.D.   On: 11/14/2015 15:43   US Paracentesis  11/19/2015  INDICATION: Suspected cirrhosis. Chronic diastolic heart failure. Fluid overload. Ascites. Request for diagnostic paracentesis with 250 ml maximum. EXAM: ULTRASOUND GUIDED LEFT ANTERIOR ABDOMEN PARACENTESIS MEDICATIONS: 1% Lidocaine. COMPLICATIONS: None immediate. PROCEDURE: Informed written consent was obtained from the patient after a discussion of the risks, benefits and alternatives to treatment. A timeout was performed prior to the initiation of the procedure. Initial ultrasound scanning demonstrates a large amount of ascites within the right lower abdominal quadrant. The right lower abdomen was prepped and draped in the usual sterile fashion. 1% lidocaine with epinephrine was used for local anesthesia. Following this, a 19 gauge, 7-cm, Yueh catheter was introduced. An ultrasound image was saved for documentation purposes. The paracentesis was performed. The catheter was removed and a dressing was applied. The patient tolerated the procedure well without immediate post procedural complication. FINDINGS: A total of approximately 250 ml of clear yellow fluid was removed. Samples were sent to the laboratory as requested by the clinical team. IMPRESSION: Successful ultrasound-guided paracentesis  yielding 250 ml of peritoneal fluid. Read by:  Gareth Eagle, PA-C Electronically Signed   By: Quita Skye  Anselm Pancoast M.D.   On: 11/19/2015 14:42   Dg Chest Port 1 View  11/16/2015  CLINICAL DATA:  Status post right thoracentesis. EXAM: PORTABLE CHEST 1 VIEW COMPARISON:  11/14/2015 FINDINGS: Normal heart size. There is scratch set there has been interval decrease in volume of the right pleural effusion. No pneumothorax identified. Asymmetric elevation of left hemidiaphragm is again noted. IMPRESSION: 1. Decrease in volume of right pleural effusion following thoracentesis. No pneumothorax. Electronically Signed   By: Kerby Moors M.D.   On: 11/16/2015 14:09   Dg Abd Portable 2v  11/18/2015  CLINICAL DATA:  Abdominal pain EXAM: PORTABLE ABDOMEN - 2 VIEW COMPARISON:  11/12/2015 abdominal radiograph FINDINGS: Moderate gaseous distention of the stomach. No dilated small bowel loops or air-fluid levels. Mild stool throughout the colon. No evidence of pneumatosis or pneumoperitoneum. Marked degenerative changes in the visualized thoracolumbar spine. Cholecystectomy clips are seen in the right upper quadrant of the abdomen. IMPRESSION: 1. Nonspecific moderate gaseous distention of the stomach . 2. No evidence of small or large bowel obstruction. Electronically Signed   By: Ilona Sorrel M.D.   On: 11/18/2015 14:12   US Abdomen Limited Ruq  11/13/2015  CLINICAL DATA:  Abdominal distention for 1 month EXAM: LIMITED ABDOMEN ULTRASOUND FOR ASCITES TECHNIQUE: Limited ultrasound survey for ascites was performed in all four abdominal quadrants. COMPARISON:  None. FINDINGS: There is moderate generalized ascites. There are loops of surrounding peristalsing bowel. IMPRESSION: Moderate generalized ascites. Electronically Signed   By: Lowella Grip III M.D.   On: 11/13/2015 09:22    ASSESSMENT & PLAN:  82 year old Caucasian female, with past history of morbid obesity, asthma, hypertension, presented with progressive weakness,  dyspnea, was found to have large ascites, pleural effusion, anasarca, and multiple cystic appearing liver lesions. Cytology from paracentesis showed atypical cells, suspicious for malignancy. CT scan revealed no significant primary lesion.  1. Atypical cells in ascites, suspicious for metastatic carcinoma, but not diagnostic  2. Cystic appearing liver lesions, ? Metastasis  3. Acute on chronic kidney disease 4. AF 5. Anorexia, and deconditioning   Recommendations: -I have reviewed her CT scan images myself and with radiologist Dr. [place] Pink today, who feels her cystic appearing lesions are probably subcapsular lesion, and suspicious for malignancy. He will discuss with interventional radiology to see if we can drain or biopsy one of these lesions  -Upper GI malignancy is a possibility. If liver biopsy is negative or uncertain about the primary, I would recommend EGD  -I have discussed that her prognosis is likely very poor if malignancy is confirmed, likely incurable. Given her advanced age, poor PS and multiple organ dysfunction, she would not be a good candidate for chemo. Pt is agreeable to have more biopsy but does not want to be very aggressive if she has terminal malignancy  -I will follow up    All questions were answered. The patient knows to call the clinic with any problems, questions or concerns.      Truitt Merle, MD 11/20/2015 5:30 PM

## 2015-11-20 NOTE — Progress Notes (Addendum)
PULMONARY / CRITICAL CARE MEDICINE   Name: Madden Pelzel MRN: [id number] DOB: 1933/10/10    ADMISSION DATE:  11/12/2015 CONSULTATION DATE: 4/18  REFERRING MD:  [facility]  CHIEF COMPLAINT: SOB  SUMMARY:   82 yo wf, never smoker, recently discharged from [facility] 4/8 after being treated for CHF, Klebsiella PNA & Klebsiella UTI. She comes to [facility] 4/17 (moved to [place] [facility]) with CC of SOB, no fever, chills or sweats, + white "slimey sputum" and abd pain. Evaluation revealed a right pleural effusion (no x rays to compare) ELEVATED WBC, & new Afib. She was started on heparin and PCCM asked to evaluate rt effusion.    SUBJECTIVE:  Pt reports fatigue after going down for CT of the abdomen.  Denies pain, fevers/chills, shortness of breath.  Denies significant relief from paracentesis / thoracentesis in regards to SOB.    VITAL SIGNS: BP 110/54 mmHg  Pulse 69  Temp(Src) 98.5 F (36.9 C) (Axillary)  Resp 17  Ht 5\' 3"  (1.6 m)  Wt 270 lb 4.5 oz (122.6 kg)  BMI 47.89 kg/m2  SpO2 91%   INTAKE / OUTPUT:  Intake/Output Summary (Last 24 hours) at 11/20/15 0928 Last data filed at 11/20/15 0700  Gross per 24 hour  Intake 1325.13 ml  Output    275 ml  Net 1050.13 ml    PHYSICAL EXAMINATION: General: MOWF in NAD, lying in bed Neuro:  Intact, MAE x 4, weak voice, generalized weakness HEENT:  No JVD, MMM, fair dentition Cardiovascular:  IR IR Lungs:  Even/non-labored, lungs bilaterally decreased bs bases, otherwise clear Abdomen: obese,non tender + bs Musculoskeletal:  intact Skin:  Lower ext ++ edema  LABS:  BMET  Recent Labs Lab 11/18/15 0606 11/19/15 0418 11/20/15 0807  NA 136 136 133*  K 4.1 4.3 5.1  CL 97* 98* 97*  CO2 21* 23 20*  BUN 84* 92* 95*  CREATININE 3.09* 3.44* 3.37*  GLUCOSE 109* 103* 111*    Electrolytes  Recent Labs Lab 11/18/15 0606 11/19/15 0418 11/20/15 0807  CALCIUM 8.5* 8.4* 8.4*  PHOS  --   --  5.9*    CBC  Recent Labs Lab 11/18/15 0606  11/19/15 0418 11/20/15 0807  WBC 17.6* 16.4* 16.7*  HGB 12.2 12.1 12.3  HCT 37.3 37.3 38.0  PLT 244 229 283   Coag's  Recent Labs Lab 11/16/15 0359  APTT 31  INR 1.22    Imaging Ct Abdomen Pelvis Wo Contrast  11/20/2015  CLINICAL DATA:  Suspected cirrhosis. Ascites. Diffuse abdominal pain. EXAM: CT ABDOMEN AND PELVIS WITHOUT CONTRAST TECHNIQUE: Multidetector CT imaging of the abdomen and pelvis was performed following the standard protocol without IV contrast. COMPARISON:  11/14/2015 abdominal ultrasound. Chest CT of 11/12/2015. FINDINGS: Lower chest: Bibasilar airspace disease, worse on the left. Mild cardiomegaly. Trace left and moderate right pleural effusions. Hepatobiliary: Multifactorial degradation, including lack of IV contrast and patient body habitus. Old granulomatous disease in the liver. Suspicion of cirrhosis on prior ultrasound. Less apparent today onCT. Capsular based low-density foci are likely subcapsular fluid collections. Example straddling the right and left lobe of the liver at 8.1 x 9.5 cm on image 25/series 2. Cholecystectomy, without biliary duct dilatation. Pancreas: Marked pancreatic atrophy without dominant mass or acute inflammation Spleen: Normal in size, without focal abnormality. Adrenals/Urinary Tract: Normal adrenal glands. Mild renal cortical thinning bilaterally. A too small to characterize upper pole left renal lesion. No hydronephrosis. Foley catheter within a decompressed urinary bladder. Stomach/Bowel: Normal stomach, without wall thickening. Scattered colonic diverticula.  Normal small bowel caliber. Vascular/Lymphatic: Aortic and branch vessel atherosclerosis. No abdominopelvic adenopathy. Reproductive: Normal uterus. Right adnexal/ovarian low-density lesion is likely a residual follicle at 2.0 cm. Other: Moderate volume abdominal ascites. The abdominal ascites is loculated, primarily positioned anteriorly (without significant fluid in the paracolic  gutters). There is also adjacent omental thickening, including on image 56/series 2. Fluid identified within the lesser sac on image 35/series 2. Anasarca. Musculoskeletal: Advanced lumbosacral spondylosis. IMPRESSION: 1. Multifactorial degradation, including patient body habitus and lack of IV contrast. 2. Moderate volume ascites, primarily loculated anteriorly. Concurrent omental edema/thickening. Probable cirrhosis. Although ascites could be secondary to portal venous hypertension, given the omental edema, omental/peritoneal metastasis cannot be excluded. Correlate with primary malignancy history and the result of recent paracentesis. 3. Capsular based low-density foci within the liver are likely due to subcapsular fluid collections. Capsular based cysts felt less likely. 4. No definite primary malignancy identified within the abdomen or pelvis. 5. Right larger than left pleural effusions with right base atelectasis and left base infection versus atelectasis. 6. Pancreatic atrophy. Electronically Signed   By: Abigail Miyamoto M.D.   On: 11/20/2015 09:02   US Paracentesis  11/19/2015  INDICATION: Suspected cirrhosis. Chronic diastolic heart failure. Fluid overload. Ascites. Request for diagnostic paracentesis with 250 ml maximum. EXAM: ULTRASOUND GUIDED LEFT ANTERIOR ABDOMEN PARACENTESIS MEDICATIONS: 1% Lidocaine. COMPLICATIONS: None immediate. PROCEDURE: Informed written consent was obtained from the patient after a discussion of the risks, benefits and alternatives to treatment. A timeout was performed prior to the initiation of the procedure. Initial ultrasound scanning demonstrates a large amount of ascites within the right lower abdominal quadrant. The right lower abdomen was prepped and draped in the usual sterile fashion. 1% lidocaine with epinephrine was used for local anesthesia. Following this, a 19 gauge, 7-cm, Yueh catheter was introduced. An ultrasound image was saved for documentation purposes. The  paracentesis was performed. The catheter was removed and a dressing was applied. The patient tolerated the procedure well without immediate post procedural complication. FINDINGS: A total of approximately 250 ml of clear yellow fluid was removed. Samples were sent to the laboratory as requested by the clinical team. IMPRESSION: Successful ultrasound-guided paracentesis yielding 250 ml of peritoneal fluid. Read by:  Gareth Eagle, PA-C Electronically Signed   By: Markus Daft M.D.   On: 11/19/2015 14:42    STUDIES:  4/25  CT ABD/Pelvis >> degraded film, moderate ascites, primarily loculated anteriorly, concurrent omental edema / thickening, probable cirrhoisis, ? Portal venous HTN vs metastasis, low-density foci within the liver are likely due to sucapsular fluid collections, no definite primary malignancy identified within the abd or pelvis, R>L pleural effusions   CULTURES: 4/18 bc >> neg 4/18 uc >> 60k orgs 4/21 Pleural fluid >> WBC 1592, neutrophil 56, exudative by LDH (0.7 by LDH), transudate by protein  GS >> abundant WBC  Culture >> 4/24 peritoneal fluid   GS >> negative  Culture >>  ANTIBIOTICS: 4/18 diflucan >> 4/23 4/18 vanc >> 4/20 4/18 Zoysn >> 4/20  ASSESSMENT / PLAN:  Recent dx of Asthma (??) - question new diagnosis of asthma in a 82 y/o F with no hx Recent dx 4/8 of Kleb P PNA Rt pleural effusion - known from previous admit at [facility], concern for parapneumonic effusion with elevated WBC in pleural fluid, cultures pending  Plan:   Wean O2 for sats >92% Push pulmonary hygiene Pleural fluid noted, transudative by protein but LDH 0.7 so borderline exudative, what is more impressive however is  WBC count in the fluid more concerning for a parapneumonic effusion. Await pleural studies DNI  Acute on chronic diastolic HF w/ hypervolemia  New onset a fib Plan:  Cont diuresis as BP/BUN/creatinine allow, ?dialysis. Cont heparin; agree would hold on coumadin until  procedures completed ?if liver and kidney biopsies are necessary. Rate control w/ lopressor  DNR  AKI vs CRI-->her creatinine was elevated on her admit at the last hospital at >2.5. Wonder if her renal dysfxn really may be the driving issue here, which would certainly explain her anasarca & total body Volume overload.  Scr remains elevated but has hit a plateau.  Plan:   Follow creatine trend Nephrology following Cautious diuresis  Renal dose meds   Anasarca, Ascites & effusion (as above) - s/p paracentesis 4/24 Liver Lesions - see CT abd as above Plan: Monitor ascitic fluid cultures Pt not certain she would want to pursue biopsy of liver lesions  FTT and severe physical deconditioning.  Has had remarkable decline slowly over the last 4 months. Has gone from ambulatory w/ cane to essentially bed/chair bound since Christmas time.  Plan: Will need extensive PT and dietary support.   Dx with recent [place], Treated with levaquin.  Plan:   Monitor off abx ID following   Noe Gens, NP-C [facility] Pgr: [phone] or if no answer [phone] 11/20/2015, 9:45 AM  Attending Note:  82 year old female with extensive PMH presenting with NASH, ascites and hepatic hydrothorax.  On exam, decreased BS on the right but improved mental status.  I reviewed abdominal CT myself, ascites and pleural effusion noted.  Interestingly the para fluid analysis is quite close to the thora fluid analysis.  Discussed with PCCM-NP.  Pleural effusion:  - No further thoras at this time.  - F/u on cytology.  Klebsiella pneumonia:  - ABx as ordered.  - Monitor WBC and fever curve.  GOC discussion:  - Full DNR confirmed.  - Involve palliative care for EOL care.  Ascites:  - F/U on cytology, ?malignancy.  - Further taps only for comfort.  - GI following.  AMS: likely metabolic in nature. Uremia:  - ?HD.  ARF: due to volume shift and heart failure.  - Unlikely to be an HD  candidate given over all health.  - BMET in AM.  - Replace electrolytes as needed.  PCCM will sign off, please call back if needed.  Patient seen and examined, agree with above note.  I dictated the care and orders written for this patient under my direction.  Rush Farmer, MD [phone]

## 2015-11-20 NOTE — Progress Notes (Signed)
Pt bladder scanned this am >999. RN repeated bladder scan read 863. Will notify MD. It is hard to differentiate where the bladder is, Pt has pitting edema in genital area

## 2015-11-20 NOTE — Progress Notes (Signed)
INFECTIOUS DISEASE PROGRESS NOTE  ID: Kendra Martinez is a 82 y.o. female with  Principal Problem:   Acute on chronic diastolic CHF (congestive heart failure), NYHA class 4 (HCC) Active Problems:   Pleural effusion   Essential hypertension   Hyponatremia   Sepsis ([place])   Anasarca   New onset a-fib (HCC)   Elevated troponin   Acute respiratory failure with hypoxemia (HCC)   Ascites   ARF (acute renal failure) (HCC)   Cirrhosis of liver with ascites (HCC)   Arterial hypotension   Liver lesion   Parapneumonic effusion   Acute encephalopathy   Palliative care encounter   DNR (do not resuscitate)  Subjective: Without complaints  Abtx:  Anti-infectives    Start     Dose/Rate Route Frequency Ordered Stop   11/14/15 2030  vancomycin (VANCOCIN) 1,500 mg in sodium chloride 0.9 % 500 mL IVPB  Status:  Discontinued     1,500 mg 250 mL/hr over 120 Minutes Intravenous Every 48 hours 11/13/15 0335 11/15/15 1004   11/13/15 2200  fluconazole (DIFLUCAN) IVPB 100 mg     100 mg 50 mL/hr over 60 Minutes Intravenous Every 24 hours 11/12/15 2314 11/18/15 2223   11/13/15 0500  piperacillin-tazobactam (ZOSYN) IVPB 3.375 g  Status:  Discontinued     3.375 g 12.5 mL/hr over 240 Minutes Intravenous Every 8 hours 11/13/15 0335 11/15/15 1004   11/12/15 2315  fluconazole (DIFLUCAN) IVPB 200 mg     200 mg 100 mL/hr over 60 Minutes Intravenous  Once 11/12/15 2313 11/13/15 0239   11/12/15 2030  vancomycin (VANCOCIN) 2,000 mg in sodium chloride 0.9 % 500 mL IVPB     2,000 mg 250 mL/hr over 120 Minutes Intravenous  Once 11/12/15 2022 11/12/15 2347   11/12/15 1915  piperacillin-tazobactam (ZOSYN) IVPB 3.375 g     3.375 g 100 mL/hr over 30 Minutes Intravenous  Once 11/12/15 1913 11/12/15 2344   11/12/15 1915  vancomycin (VANCOCIN) IVPB 1000 mg/200 mL premix  Status:  Discontinued     1,000 mg 200 mL/hr over 60 Minutes Intravenous  Once 11/12/15 1913 11/12/15 2022      Medications:  Scheduled: .  feeding supplement  1 Container Oral BID BM  . magic mouthwash  5 mL Oral TID  . metoprolol tartrate  12.5 mg Oral BID  . midodrine  10 mg Oral TID WC  . mometasone-formoterol  2 puff Inhalation BID  . octreotide  100 mcg Subcutaneous Q12H  . ondansetron (ZOFRAN) IV  4 mg Intravenous Once  . sertraline  25 mg Oral Daily  . sodium bicarbonate  650 mg Oral BID  . sodium chloride flush  3 mL Intravenous Q12H  . sodium chloride flush  3 mL Intravenous Q12H    Objective: Vital signs in last 24 hours: Temp:  [97.8 F (36.6 C)-99.3 F (37.4 C)] 98.5 F (36.9 C) (04/25 0747) Pulse Rate:  [69-83] 69 (04/25 0747) Resp:  [16-20] 17 (04/25 0636) BP: (94-110)/(54-82) 110/54 mmHg (04/25 0747) SpO2:  [91 %-98 %] 98 % (04/25 0938) Weight:  [122.6 kg (270 lb 4.5 oz)] 122.6 kg (270 lb 4.5 oz) (04/25 0500)   General appearance: alert, cooperative and no distress Resp: diminished breath sounds bilaterally Cardio: regular rate and rhythm GI: normal findings: bowel sounds normal and soft, non-tender Extremities: edema anasarca  Lab Results  Recent Labs  11/19/15 0418 11/20/15 0807  WBC 16.4* 16.7*  HGB 12.1 12.3  HCT 37.3 38.0  NA 136 133*  K  4.3 5.1  CL 98* 97*  CO2 23 20*  BUN 92* 95*  CREATININE 3.44* 3.37*   Liver Panel  Recent Labs  11/20/15 0807  ALBUMIN 2.0*   Sedimentation Rate No results for input(s): ESRSEDRATE in the last 72 hours. C-Reactive Protein No results for input(s): CRP in the last 72 hours.  Microbiology: Recent Results (from the past 240 hour(s))  Blood culture (routine x 2)     Status: None   Collection Time: 11/12/15  8:20 PM  Result Value Ref Range Status   Specimen Description BLOOD RIGHT ANTECUBITAL  Final   Special Requests IN PEDIATRIC BOTTLE 3CC  Final   Culture NO GROWTH 5 DAYS  Final   Report Status 11/17/2015 FINAL  Final  Blood culture (routine x 2)     Status: None   Collection Time: 11/12/15  8:25 PM  Result Value Ref Range Status     Specimen Description BLOOD RIGHT HAND  Final   Special Requests IN PEDIATRIC BOTTLE 3CC  Final   Culture NO GROWTH 5 DAYS  Final   Report Status 11/17/2015 FINAL  Final  Urine culture     Status: Abnormal   Collection Time: 11/13/15 11:32 AM  Result Value Ref Range Status   Specimen Description URINE, CLEAN CATCH  Final   Special Requests NONE  Final   Culture 60,000 COLONIES/mL YEAST (A)  Final   Report Status 11/14/2015 FINAL  Final  MRSA PCR Screening     Status: None   Collection Time: 11/13/15  5:21 PM  Result Value Ref Range Status   MRSA by PCR NEGATIVE NEGATIVE Final    Comment:        The GeneXpert MRSA Assay (FDA approved for NASAL specimens only), is one component of a comprehensive MRSA colonization surveillance program. It is not intended to diagnose MRSA infection nor to guide or monitor treatment for MRSA infections.   Culture, body fluid-bottle     Status: None (Preliminary result)   Collection Time: 11/16/15 12:40 PM  Result Value Ref Range Status   Specimen Description FLUID RIGHT PLEURAL  Final   Special Requests BOTTLES DRAWN AEROBIC AND ANAEROBIC 3CC  Final   Culture NO GROWTH 3 DAYS  Final   Report Status PENDING  Incomplete  Gram stain     Status: None   Collection Time: 11/16/15 12:40 PM  Result Value Ref Range Status   Specimen Description FLUID RIGHT PLEURAL  Final   Special Requests NONE  Final   Gram Stain   Final    ABUNDANT WBC PRESENT,BOTH PMN AND MONONUCLEAR NO ORGANISMS SEEN    Report Status 11/16/2015 FINAL  Final  Culture, body fluid-bottle     Status: None (Preliminary result)   Collection Time: 11/19/15 12:44 PM  Result Value Ref Range Status   Specimen Description FLUID PERITONEAL  Final   Special Requests BOTTLES DRAWN AEROBIC AND ANAEROBIC 8CC  Final   Culture PENDING  Incomplete   Report Status PENDING  Incomplete  Gram stain     Status: None   Collection Time: 11/19/15 12:44 PM  Result Value Ref Range Status   Specimen  Description FLUID PERITONEAL  Final   Special Requests NONE  Final   Gram Stain   Final    WBC PRESENT,BOTH PMN AND MONONUCLEAR NO ORGANISMS SEEN CYTOSPIN SMEAR    Report Status 11/19/2015 FINAL  Final    Studies/Results: Ct Abdomen Pelvis Wo Contrast  11/20/2015  CLINICAL DATA:  Suspected cirrhosis. Ascites. Diffuse  abdominal pain. EXAM: CT ABDOMEN AND PELVIS WITHOUT CONTRAST TECHNIQUE: Multidetector CT imaging of the abdomen and pelvis was performed following the standard protocol without IV contrast. COMPARISON:  11/14/2015 abdominal ultrasound. Chest CT of 11/12/2015. FINDINGS: Lower chest: Bibasilar airspace disease, worse on the left. Mild cardiomegaly. Trace left and moderate right pleural effusions. Hepatobiliary: Multifactorial degradation, including lack of IV contrast and patient body habitus. Old granulomatous disease in the liver. Suspicion of cirrhosis on prior ultrasound. Less apparent today onCT. Capsular based low-density foci are likely subcapsular fluid collections. Example straddling the right and left lobe of the liver at 8.1 x 9.5 cm on image 25/series 2. Cholecystectomy, without biliary duct dilatation. Pancreas: Marked pancreatic atrophy without dominant mass or acute inflammation Spleen: Normal in size, without focal abnormality. Adrenals/Urinary Tract: Normal adrenal glands. Mild renal cortical thinning bilaterally. A too small to characterize upper pole left renal lesion. No hydronephrosis. Foley catheter within a decompressed urinary bladder. Stomach/Bowel: Normal stomach, without wall thickening. Scattered colonic diverticula. Normal small bowel caliber. Vascular/Lymphatic: Aortic and branch vessel atherosclerosis. No abdominopelvic adenopathy. Reproductive: Normal uterus. Right adnexal/ovarian low-density lesion is likely a residual follicle at 2.0 cm. Other: Moderate volume abdominal ascites. The abdominal ascites is loculated, primarily positioned anteriorly (without  significant fluid in the paracolic gutters). There is also adjacent omental thickening, including on image 56/series 2. Fluid identified within the lesser sac on image 35/series 2. Anasarca. Musculoskeletal: Advanced lumbosacral spondylosis. IMPRESSION: 1. Multifactorial degradation, including patient body habitus and lack of IV contrast. 2. Moderate volume ascites, primarily loculated anteriorly. Concurrent omental edema/thickening. Probable cirrhosis. Although ascites could be secondary to portal venous hypertension, given the omental edema, omental/peritoneal metastasis cannot be excluded. Correlate with primary malignancy history and the result of recent paracentesis. 3. Capsular based low-density foci within the liver are likely due to subcapsular fluid collections. Capsular based cysts felt less likely. 4. No definite primary malignancy identified within the abdomen or pelvis. 5. Right larger than left pleural effusions with right base atelectasis and left base infection versus atelectasis. 6. Pancreatic atrophy. Electronically Signed   By: Abigail Miyamoto M.D.   On: 11/20/2015 09:02   US Paracentesis  11/19/2015  INDICATION: Suspected cirrhosis. Chronic diastolic heart failure. Fluid overload. Ascites. Request for diagnostic paracentesis with 250 ml maximum. EXAM: ULTRASOUND GUIDED LEFT ANTERIOR ABDOMEN PARACENTESIS MEDICATIONS: 1% Lidocaine. COMPLICATIONS: None immediate. PROCEDURE: Informed written consent was obtained from the patient after a discussion of the risks, benefits and alternatives to treatment. A timeout was performed prior to the initiation of the procedure. Initial ultrasound scanning demonstrates a large amount of ascites within the right lower abdominal quadrant. The right lower abdomen was prepped and draped in the usual sterile fashion. 1% lidocaine with epinephrine was used for local anesthesia. Following this, a 19 gauge, 7-cm, Yueh catheter was introduced. An ultrasound image was saved  for documentation purposes. The paracentesis was performed. The catheter was removed and a dressing was applied. The patient tolerated the procedure well without immediate post procedural complication. FINDINGS: A total of approximately 250 ml of clear yellow fluid was removed. Samples were sent to the laboratory as requested by the clinical team. IMPRESSION: Successful ultrasound-guided paracentesis yielding 250 ml of peritoneal fluid. Read by:  Gareth Eagle, PA-C Electronically Signed   By: Markus Daft M.D.   On: 11/19/2015 14:42   Dg Abd Portable 2v  11/18/2015  CLINICAL DATA:  Abdominal pain EXAM: PORTABLE ABDOMEN - 2 VIEW COMPARISON:  11/12/2015 abdominal radiograph FINDINGS: Moderate gaseous distention of  the stomach. No dilated small bowel loops or air-fluid levels. Mild stool throughout the colon. No evidence of pneumatosis or pneumoperitoneum. Marked degenerative changes in the visualized thoracolumbar spine. Cholecystectomy clips are seen in the right upper quadrant of the abdomen. IMPRESSION: 1. Nonspecific moderate gaseous distention of the stomach . 2. No evidence of small or large bowel obstruction. Electronically Signed   By: Ilona Sorrel M.D.   On: 11/18/2015 14:12     Assessment/Plan: Pleural Effusion Liver cysts NASH Hepatorenal syndrome  Total days of antibiotics: 4 off (4-7 --> 4-20 Vanco/zosyn/diflucan)  I reviewed her CT with her family.  Explained her medical course, diagnostics with her family at length.  Her CT does not show abscess but more likely she has sub-capsular fluid from her fluid overload.  Available as needed.          [name] Infectious Diseases (pager) [phone] [email or web address] 11/20/2015, 10:04 AM  LOS: 8 days

## 2015-11-20 NOTE — Progress Notes (Signed)
Pt has Magic cups that we have been encouraging her to eat in lieu of Boost/Ensure

## 2015-11-20 NOTE — Progress Notes (Signed)
Pt very lethargic this afternoon, reporting she is "tired". Family encouraging pt to increase PO intake and to sit up on the side of the bed. RN talked with family about making sure the patient is comfortable, and we decided to do leg exercises in the bed. Pt does not wish to sit on the side of the bed right now. Etta Quill, RN

## 2015-11-20 NOTE — Progress Notes (Addendum)
[facility] PROGRESS NOTE  Kendra Martinez [id number] DOB: 09-30-1933 DOA: 11/12/2015  PCP: She has recently relocated to this area. Does not have any providers here.  Brief HPI: 82 year old Caucasian female with a past medical history of hypertension, diastolic CHF, recently treated pneumonia at an outside facility, presented with was needing shortness of breath. Patient is new to [place]. She has recently relocated to this area. Does not see any providers here. Patient was seen by multiple specialists including cardiology, pulmonology, nephrology and gastroenterology. She was found to have a new diagnosis of liver cirrhosis with ascites. She underwent thoracentesis which was thought to be a transudative fluid. Patient remains significantly fluid overloaded. She does not have good urine output. Her renal function is declining. This is thought to be hepatorenal. Multiple specialties on board including palliative medicine.  Past medical history:  Past Medical History  Diagnosis Date  . CHF (congestive heart failure) ([place])   . Hypertension   . Pleural effusion 10/2015  . Bipolar disorder ([place])   . GERD (gastroesophageal reflux disease)   . Ascites   . Arthritis     oa in knees    Consultants: Cardiology. Pulmonology. Nephrology. Gastroenterology. Palliative medicine  Procedures:  Transthoracic echocardiogram Study Conclusions - Left ventricle: The cavity size was normal. There was mild focal basal hypertrophy of the septum. Systolic function was mildly reduced. The estimated ejection fraction was in the range of 45% to 50%. Wall motion was normal; there were no regional wall motion abnormalities. - Aortic valve: Trileaflet; mildly thickened, mildly calcified leaflets. - Mitral valve: Calcified annulus. There was trivial regurgitation.  Thoracentesis 4/21  Paracentesis 4/24  Antibiotics: Vancomycin and Zosyn 4/17-->4/20  Subjective: Patient continues to feel weak. Denies  any shortness of breath. She states that her back has been bothering her ever since she back from the CT scan. Denies any falls or injuries. Her husband and son are at the bedside.   Objective:   Vital Signs  Filed Vitals:   11/20/15 0636 11/20/15 0747 11/20/15 0938 11/20/15 1030  BP:  110/54  96/64  Pulse: 83 69  81  Temp:  98.5 F (36.9 C)    TempSrc:  Axillary    Resp: 17   26  Height:      Weight:      SpO2: 95% 91% 98% 98%    Intake/Output Summary (Last 24 hours) at 11/20/15 1222 Last data filed at 11/20/15 0947  Gross per 24 hour  Intake 1445.13 ml  Output    275 ml  Net 1170.13 ml   Filed Weights   11/18/15 0410 11/19/15 0459 11/20/15 0500  Weight: 122.199 kg (269 lb 6.4 oz) 121.11 kg (267 lb) 122.6 kg (270 lb 4.5 oz)    General appearance: alert, cooperative, Fatigued. Resp: Improved air entry on the right. Remains diminished at the bases. No definite crackles. No wheezing or rhonchi. Cardio: regular rate and rhythm, S1, S2 normal, no murmur, click, rub or gallop GI: Abdomen is soft. Distended. Slightly tender in the left lower quadrant without any rebound, rigidity or guarding. No masses or organomegaly. ascites. Extremities: Bilateral pedal edema is present.  Neurologic: Alert and oriented X 3. No focal deficits.  Lab Results:  Data Reviewed: I have personally reviewed following labs and imaging studies  CBC:  Recent Labs Lab 11/16/15 0359 11/17/15 0218 11/18/15 0606 11/19/15 0418 11/20/15 0807  WBC 15.6* 18.0* 17.6* 16.4* 16.7*  HGB 12.0 12.1 12.2 12.1 12.3  HCT 36.8 36.3 37.3 37.3  38.0  MCV 92.5 92.8 93.0 93.3 93.1  PLT 229 236 244 229 [id number]   Basic Metabolic Panel:  Recent Labs Lab 11/16/15 0359 11/17/15 0218 11/18/15 0606 11/19/15 0418 11/20/15 0807  NA 137 133* 136 136 133*  K 4.1 4.3 4.1 4.3 5.1  CL 99* 99* 97* 98* 97*  CO2 22 17* 21* 23 20*  GLUCOSE 103* 108* 109* 103* 111*  BUN 70* 76* 84* 92* 95*  CREATININE 2.87* 2.84* 3.09*  3.44* 3.37*  CALCIUM 8.2* 8.2* 8.5* 8.4* 8.4*  PHOS  --   --   --   --  5.9*    GFR: Estimated Creatinine Clearance: 17.2 mL/min (by C-G formula based on Cr of 3.37).  Liver Function Tests:  Recent Labs Lab 11/16/15 0359 11/20/15 0807  AST 21  --   ALT 15  --   ALKPHOS 56  --   BILITOT 1.0  --   PROT 5.3*  5.2*  --   ALBUMIN 2.0* 2.0*   Coagulation Profile:  Recent Labs Lab 11/16/15 0359  INR 1.22   Cardiac Enzymes: No results for input(s): CKTOTAL, CKMB, CKMBINDEX, TROPONINI in the last 168 hours. Urine analysis:    Component Value Date/Time   COLORURINE YELLOW 11/13/2015 1132   APPEARANCEUR CLOUDY* 11/13/2015 1132   LABSPEC 1.023 11/13/2015 1132   PHURINE 5.5 11/13/2015 1132   GLUCOSEU NEGATIVE 11/13/2015 1132   HGBUR NEGATIVE 11/13/2015 1132   BILIRUBINUR MODERATE* 11/13/2015 1132   [place] 11/13/2015 1132   PROTEINUR 30* 11/13/2015 1132   NITRITE NEGATIVE 11/13/2015 1132   LEUKOCYTESUR MODERATE* 11/13/2015 1132    Recent Results (from the past 240 hour(s))  Blood culture (routine x 2)     Status: None   Collection Time: 11/12/15  8:20 PM  Result Value Ref Range Status   Specimen Description BLOOD RIGHT ANTECUBITAL  Final   Special Requests IN PEDIATRIC BOTTLE 3CC  Final   Culture NO GROWTH 5 DAYS  Final   Report Status 11/17/2015 FINAL  Final  Blood culture (routine x 2)     Status: None   Collection Time: 11/12/15  8:25 PM  Result Value Ref Range Status   Specimen Description BLOOD RIGHT HAND  Final   Special Requests IN PEDIATRIC BOTTLE 3CC  Final   Culture NO GROWTH 5 DAYS  Final   Report Status 11/17/2015 FINAL  Final  Urine culture     Status: Abnormal   Collection Time: 11/13/15 11:32 AM  Result Value Ref Range Status   Specimen Description URINE, CLEAN CATCH  Final   Special Requests NONE  Final   Culture 60,000 COLONIES/mL YEAST (A)  Final   Report Status 11/14/2015 FINAL  Final  MRSA PCR Screening     Status: None    Collection Time: 11/13/15  5:21 PM  Result Value Ref Range Status   MRSA by PCR NEGATIVE NEGATIVE Final    Comment:        The GeneXpert MRSA Assay (FDA approved for NASAL specimens only), is one component of a comprehensive MRSA colonization surveillance program. It is not intended to diagnose MRSA infection nor to guide or monitor treatment for MRSA infections.   Culture, body fluid-bottle     Status: None (Preliminary result)   Collection Time: 11/16/15 12:40 PM  Result Value Ref Range Status   Specimen Description FLUID RIGHT PLEURAL  Final   Special Requests BOTTLES DRAWN AEROBIC AND ANAEROBIC 3CC  Final   Culture NO GROWTH 4 DAYS  Final   Report Status PENDING  Incomplete  Gram stain     Status: None   Collection Time: 11/16/15 12:40 PM  Result Value Ref Range Status   Specimen Description FLUID RIGHT PLEURAL  Final   Special Requests NONE  Final   Gram Stain   Final    ABUNDANT WBC PRESENT,BOTH PMN AND MONONUCLEAR NO ORGANISMS SEEN    Report Status 11/16/2015 FINAL  Final  Culture, body fluid-bottle     Status: None (Preliminary result)   Collection Time: 11/19/15 12:44 PM  Result Value Ref Range Status   Specimen Description FLUID PERITONEAL  Final   Special Requests BOTTLES DRAWN AEROBIC AND ANAEROBIC 8CC  Final   Culture NO GROWTH < 24 HOURS  Final   Report Status PENDING  Incomplete  Gram stain     Status: None   Collection Time: 11/19/15 12:44 PM  Result Value Ref Range Status   Specimen Description FLUID PERITONEAL  Final   Special Requests NONE  Final   Gram Stain   Final    WBC PRESENT,BOTH PMN AND MONONUCLEAR NO ORGANISMS SEEN CYTOSPIN SMEAR    Report Status 11/19/2015 FINAL  Final      Radiology Studies: Ct Abdomen Pelvis Wo Contrast  11/20/2015  CLINICAL DATA:  Suspected cirrhosis. Ascites. Diffuse abdominal pain. EXAM: CT ABDOMEN AND PELVIS WITHOUT CONTRAST TECHNIQUE: Multidetector CT imaging of the abdomen and pelvis was performed following  the standard protocol without IV contrast. COMPARISON:  11/14/2015 abdominal ultrasound. Chest CT of 11/12/2015. FINDINGS: Lower chest: Bibasilar airspace disease, worse on the left. Mild cardiomegaly. Trace left and moderate right pleural effusions. Hepatobiliary: Multifactorial degradation, including lack of IV contrast and patient body habitus. Old granulomatous disease in the liver. Suspicion of cirrhosis on prior ultrasound. Less apparent today onCT. Capsular based low-density foci are likely subcapsular fluid collections. Example straddling the right and left lobe of the liver at 8.1 x 9.5 cm on image 25/series 2. Cholecystectomy, without biliary duct dilatation. Pancreas: Marked pancreatic atrophy without dominant mass or acute inflammation Spleen: Normal in size, without focal abnormality. Adrenals/Urinary Tract: Normal adrenal glands. Mild renal cortical thinning bilaterally. A too small to characterize upper pole left renal lesion. No hydronephrosis. Foley catheter within a decompressed urinary bladder. Stomach/Bowel: Normal stomach, without wall thickening. Scattered colonic diverticula. Normal small bowel caliber. Vascular/Lymphatic: Aortic and branch vessel atherosclerosis. No abdominopelvic adenopathy. Reproductive: Normal uterus. Right adnexal/ovarian low-density lesion is likely a residual follicle at 2.0 cm. Other: Moderate volume abdominal ascites. The abdominal ascites is loculated, primarily positioned anteriorly (without significant fluid in the paracolic gutters). There is also adjacent omental thickening, including on image 56/series 2. Fluid identified within the lesser sac on image 35/series 2. Anasarca. Musculoskeletal: Advanced lumbosacral spondylosis. IMPRESSION: 1. Multifactorial degradation, including patient body habitus and lack of IV contrast. 2. Moderate volume ascites, primarily loculated anteriorly. Concurrent omental edema/thickening. Probable cirrhosis. Although ascites could be  secondary to portal venous hypertension, given the omental edema, omental/peritoneal metastasis cannot be excluded. Correlate with primary malignancy history and the result of recent paracentesis. 3. Capsular based low-density foci within the liver are likely due to subcapsular fluid collections. Capsular based cysts felt less likely. 4. No definite primary malignancy identified within the abdomen or pelvis. 5. Right larger than left pleural effusions with right base atelectasis and left base infection versus atelectasis. 6. Pancreatic atrophy. Electronically Signed   By: Abigail Miyamoto M.D.   On: 11/20/2015 09:02   US Paracentesis  11/19/2015  INDICATION: Suspected  cirrhosis. Chronic diastolic heart failure. Fluid overload. Ascites. Request for diagnostic paracentesis with 250 ml maximum. EXAM: ULTRASOUND GUIDED LEFT ANTERIOR ABDOMEN PARACENTESIS MEDICATIONS: 1% Lidocaine. COMPLICATIONS: None immediate. PROCEDURE: Informed written consent was obtained from the patient after a discussion of the risks, benefits and alternatives to treatment. A timeout was performed prior to the initiation of the procedure. Initial ultrasound scanning demonstrates a large amount of ascites within the right lower abdominal quadrant. The right lower abdomen was prepped and draped in the usual sterile fashion. 1% lidocaine with epinephrine was used for local anesthesia. Following this, a 19 gauge, 7-cm, Yueh catheter was introduced. An ultrasound image was saved for documentation purposes. The paracentesis was performed. The catheter was removed and a dressing was applied. The patient tolerated the procedure well without immediate post procedural complication. FINDINGS: A total of approximately 250 ml of clear yellow fluid was removed. Samples were sent to the laboratory as requested by the clinical team. IMPRESSION: Successful ultrasound-guided paracentesis yielding 250 ml of peritoneal fluid. Read by:  Gareth Eagle, PA-C Electronically  Signed   By: Markus Daft M.D.   On: 11/19/2015 14:42   Dg Abd Portable 2v  11/18/2015  CLINICAL DATA:  Abdominal pain EXAM: PORTABLE ABDOMEN - 2 VIEW COMPARISON:  11/12/2015 abdominal radiograph FINDINGS: Moderate gaseous distention of the stomach. No dilated small bowel loops or air-fluid levels. Mild stool throughout the colon. No evidence of pneumatosis or pneumoperitoneum. Marked degenerative changes in the visualized thoracolumbar spine. Cholecystectomy clips are seen in the right upper quadrant of the abdomen. IMPRESSION: 1. Nonspecific moderate gaseous distention of the stomach . 2. No evidence of small or large bowel obstruction. Electronically Signed   By: Ilona Sorrel M.D.   On: 11/18/2015 14:12     Medications:  Scheduled: . feeding supplement  1 Container Oral BID BM  . magic mouthwash  5 mL Oral TID  . metoprolol tartrate  12.5 mg Oral BID  . midodrine  10 mg Oral TID WC  . mometasone-formoterol  2 puff Inhalation BID  . octreotide  100 mcg Subcutaneous Q12H  . ondansetron (ZOFRAN) IV  4 mg Intravenous Once  . sertraline  25 mg Oral Daily  . sodium bicarbonate  650 mg Oral BID  . sodium chloride flush  3 mL Intravenous Q12H  . sodium chloride flush  3 mL Intravenous Q12H   Continuous: . heparin 1,050 Units/hr (11/19/15 1946)   [id number] chloride, acetaminophen **OR** acetaminophen, ALPRAZolam, HYDROcodone-acetaminophen, ondansetron **OR** ondansetron (ZOFRAN) IV, prochlorperazine, sodium chloride flush, traMADol  Assessment/Plan:  Principal Problem:   Acute on chronic diastolic CHF (congestive heart failure), NYHA class 4 (HCC) Active Problems:   Pleural effusion   Essential hypertension   Hyponatremia   Sepsis ([place])   Anasarca   New onset a-fib (HCC)   Elevated troponin   Acute respiratory failure with hypoxemia (HCC)   Ascites   ARF (acute renal failure) (H[facility])   Cirrhosis of liver with ascites ([facility])   Arterial hypotension   Liver lesion   Parapneumonic  effusion   Acute encephalopathy   Palliative care encounter   DNR (do not resuscitate)    Acute on chronic kidney disease/Hepatorenal syndrome Based on data available on care-everywhere, her BUN and creatinine was normal in July 2016. Worsening in creatinine noted early April. So this appears to be acute renal failure. She did receive IV contrast for CT imaging in February. Now that she is suspected of having liver cirrhosis, renal failure could be suggestive of hepatorenal  syndrome. Nephrology is following. Urine output remains poor. Creatinine remains high. Discussions with Dr. Justin Mend and Dr. Arty Baumgartner over the last 2-3 days. Patient is not a dialysis candidate. Patient and family not to pursue dialysis. Urine output remains poor. Prognosis appears poor at this time. Palliative medicine has been consulted. Patient started on midodrine and octreotide for hepatorenal syndrome.  Liver cirrhosis of unclear etiology/hepatic cysts Etiology unclear but could be NASH. Gastroenterology is following. Hepatitis panel is negative. Ultrasound report reviewed. Liver cysts also noted. May need MRI at some point. She is hypoalbuminemic. PT/INR was elevated. INR normal 4/21. Patient underwent paracentesis on 4/24. She underwent CT scan of her abdomen and pelvis today. GI to weigh in further.   Right-sided pleural effusion Etiology is unclear but probably transudative secondary to liver cirrhosis or heart failure. Patient was seen by pulmonology. Patient underwent thoracentesis 4/21. LDH in the fluid was 139. Total protein less than 3. Cultures are negative so far. Cytology is pending. She did have elevated WBC in the pleural fluid. This could be a post infectious process. This is considering recent pneumonia. But could also be due to her liver process. CT scan of the chest did not show any infiltrates. Echocardiogram only showed minimally depressed ejection fraction. PA pressure is noted to be normal.   Anasarca  with ascites, pleural effusion,  According to patient reports she could've gained approximately 40-50 pounds or even more over the last many months. Reason for her presentation is not entirely clear, but studies done so far suggests liver cirrhosis could be the main reason. Echocardiogram shows EF of 45-50%. PA pressures are normal. Ultrasound did show evidence for liver cirrhosis. She has hypoalbuminemia. Her PT/INR was slightly elevated. Patient denies any history of liver disease, hepatitis. Hepatitis panel is negative. Lasix was discontinued due to worsening creatinine.    Nausea and vomiting Symptoms improved with antiemetics. Most probably due to renal failure and uremia. Abdomen is benign. She did have a CT scan of her abdomen and pelvis in February when she was hospitalized in another facility. Plain imaging films did not show any acute findings. CT scan repeated. No evidence for obstruction.  Hypotension Blood pressure was low and has improved with midodrine. Metoprolol dose was decreased with holding parameters. Continue to monitor closely.  New onset atrial fibrillation Cardiology is following. Echocardiogram is as above. TSH is 3.12. CHADs2vasc score is 3. Patient started on intravenous heparin. Heart rate is reasonably well controlled. Continue metoprolol.  Recent Pneumonia in early April Diagnosed at an outside facility. She has completed treatment. No clear evidence for infectious process at this time. She does have an elevated WBC, but she is afebrile. Cultures negative so far. Antibiotics were discontinued 4/20. Sepsis appears to have been ruled out.  Mildly elevated troponin Likely demand ischemia. Cardiology is following.  Essential hypertension See above.  Yeast in urine Patient completed a course of Diflucan.  ADDENDUM Cytology report from the pleural fluid analysis was released today. 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 Discussed with oncologist, Dr.  Burr Medico. She will evaluate the patient this evening. I have ordered CA-125 and alpha-fetoprotein. Discussed with the patient's husband. Cytology from ascites fluid is pending.  DVT Prophylaxis: On IV heparin    Code Status: Full Code  Family Communication: Discussed in detail with patient and her husband over the last few days. They have decided against dialysis. They also understand that dialysis is not an option in this situation. They do understand that the  patient is declining. They're very frustrated at the lack of clear reason/definitive diagnosis for her medical conditions.  Disposition Plan: Await further input from specialties. Palliative medicine to meet with patient and family today.     LOS: 8 days   [facility] Pager [phone] 11/20/2015, 12:22 PM  If 7PM-7AM, please contact night-coverage at [email or web address], password [email or web address]

## 2015-11-20 NOTE — Progress Notes (Signed)
Physical Therapy Treatment Patient Details Name: Kendra Martinez MRN: [id number] DOB: June 16, 1934 Today's Date: 11/20/2015    History of Present Illness This 82 y.o. female admitted wit SOB and generalized weakness.   Dx; Rt pleural effusion of unclear etiology, recently diagnosed Lt heart failure and HTN, as well as sepsis.  Paracenthesis 4/24 PMH includes: Bipolar disorder    PT Comments    Patient seen for activity progression. Patient very reluctant to participate but agreeable. Pt tolerated EOB activity ~10 minutes in conjunction with functional tasks. Educated on LE exercise and positioning. Patient with some self limiting. Educated patient and family to allow patient to perform her own functional tasks as able. Will continue to see and progress as tolerated. OF NOTE: BP assessed x3 and stable 100s/80s   Follow Up Recommendations  SNF     Equipment Recommendations  None recommended by PT    Recommendations for Other Services       Precautions / Restrictions Precautions Precautions: Fall Precaution Comments: pt is very weak Restrictions Weight Bearing Restrictions: No    Mobility  Bed Mobility Overal bed mobility: Needs Assistance;+2 for physical assistance Bed Mobility: Supine to Sit;Sit to Supine     Supine to sit: +2 for physical assistance;Total assist Sit to supine: +2 for physical assistance;Total assist   General bed mobility comments: assist with all aspects, step by step cues for sequence  Transfers                    Ambulation/Gait                 Stairs            Wheelchair Mobility    Modified Rankin (Stroke Patients Only)       Balance Overall balance assessment: Needs assistance Sitting-balance support: Single extremity supported Sitting balance-Leahy Scale: Poor Sitting balance - Comments: min guard assist at EOB, tolerated x 10 minutes. Reported dizziness intially, VSS.                             Cognition Arousal/Alertness: Awake/alert Behavior During Therapy: Anxious Overall Cognitive Status: Within Functional Limits for tasks assessed                      Exercises      General Comments General comments (skin integrity, edema, etc.): Dizziness EOB, BP stable 100s/80s      Pertinent Vitals/Pain Pain Assessment: Faces Faces Pain Scale: Hurts little more Pain Location: LEs Pain Descriptors / Indicators: Grimacing;Sore Pain Intervention(s): Monitored during session;Repositioned    Home Living                      Prior Function            PT Goals (current goals can now be found in the care plan section) Acute Rehab PT Goals Patient Stated Goal: to get stronger so she can go back home.  PT Goal Formulation: With patient/family Time For Goal Achievement: 11/29/15 Potential to Achieve Goals: Good Progress towards PT goals: Progressing toward goals (very modestly)    Frequency  Min 2X/week    PT Plan Current plan remains appropriate    Co-evaluation             End of Session Equipment Utilized During Treatment: Oxygen Activity Tolerance: Other (comment) (limited by lighheadedness in sitting. ) Patient left: in bed;with call bell/phone within reach;with  family/visitor present     Time: [id number] PT Time Calculation (min) (ACUTE ONLY): 18 min  Charges:  $Therapeutic Activity: 8-22 mins                    G CodesDuncan Martinez Dec 13, 2015, 1:37 PM  Kendra Martinez, [place] DPT  [phone]

## 2015-11-20 NOTE — Progress Notes (Signed)
Patient ID: Kendra Martinez, female   DOB: 1934/03/10, 82 y.o.   MRN: [id number] S: Feels "ok" today. Family at bedside.  O:BP 126/82 mmHg  Pulse 74  Temp(Src) 97.7 F (36.5 C) (Oral)  Resp 26  Ht _0  (1.6 m)  Wt 270 lb 4.5 oz (122.6 kg)  BMI 47.89 kg/m2  SpO2 94%  Intake/Output Summary (Last 24 hours) at 11/20/15 1256 Last data filed at 11/20/15 0947  Gross per 24 hour  Intake 1325.13 ml  Output    275 ml  Net 1050.13 ml   Intake/Output: I/O last 3 completed shifts: In: 1751.5 [P.O.:1260; I.V.:491.5] Out: 425 [Urine:425]  Intake/Output this shift:  Total I/O In: 240 [P.O.:240] Out: -  Weight change: 3 lb 4.5 oz (1.489 kg) JEH:UDJSH elderly, pale WF in NAD CVS:no rub Resp:decreased BS at bases FWY:OVZCHYIFO, obese, nontender Ext: 1+ edema   Recent Labs Lab 11/14/15 0622 11/15/15 0346 11/16/15 0359 11/17/15 0218 11/18/15 0606 11/19/15 0418 11/20/15 0807  NA 133* 136 137 133* 136 136 133*  K 4.4 3.9 4.1 4.3 4.1 4.3 5.1  CL 94* 97* 99* 99* 97* 98* 97*  CO2 21* 21* 22 17* 21* 23 20*  GLUCOSE 109* 103* 103* 108* 109* 103* 111*  BUN 63* 65* 70* 76* 84* 92* 95*  CREATININE 2.74* 2.73* 2.87* 2.84* 3.09* 3.44* 3.37*  ALBUMIN  --   --  2.0*  --   --   --  2.0*  CALCIUM 8.1* 8.0* 8.2* 8.2* 8.5* 8.4* 8.4*  PHOS  --   --   --   --   --   --  5.9*  AST  --   --  21  --   --   --   --   ALT  --   --  15  --   --   --   --    Liver Function Tests:  Recent Labs Lab 11/16/15 0359 11/20/15 0807  AST 21  --   ALT 15  --   ALKPHOS 56  --   BILITOT 1.0  --   PROT 5.3*  5.2*  --   ALBUMIN 2.0* 2.0*   No results for input(s): LIPASE, AMYLASE in the last 168 hours.  Recent Labs Lab 11/19/15 0418  AMMONIA 33   CBC:  Recent Labs Lab 11/16/15 0359 11/17/15 0218 11/18/15 0606 11/19/15 0418 11/20/15 0807  WBC 15.6* 18.0* 17.6* 16.4* 16.7*  HGB 12.0 12.1 12.2 12.1 12.3  HCT 36.8 36.3 37.3 37.3 38.0  MCV 92.5 92.8 93.0 93.3 93.1  PLT 229 236 244 229 283    Cardiac Enzymes: No results for input(s): CKTOTAL, CKMB, CKMBINDEX, TROPONINI in the last 168 hours. CBG: No results for input(s): GLUCAP in the last 168 hours.  Iron Studies: No results for input(s): IRON, TIBC, TRANSFERRIN, FERRITIN in the last 72 hours. Studies/Results: Ct Abdomen Pelvis Wo Contrast  11/20/2015  CLINICAL DATA:  Suspected cirrhosis. Ascites. Diffuse abdominal pain. EXAM: CT ABDOMEN AND PELVIS WITHOUT CONTRAST TECHNIQUE: Multidetector CT imaging of the abdomen and pelvis was performed following the standard protocol without IV contrast. COMPARISON:  11/14/2015 abdominal ultrasound. Chest CT of 11/12/2015. FINDINGS: Lower chest: Bibasilar airspace disease, worse on the left. Mild cardiomegaly. Trace left and moderate right pleural effusions. Hepatobiliary: Multifactorial degradation, including lack of IV contrast and patient body habitus. Old granulomatous disease in the liver. Suspicion of cirrhosis on prior ultrasound. Less apparent today onCT. Capsular based low-density foci are likely subcapsular fluid collections. Example straddling  the right and left lobe of the liver at 8.1 x 9.5 cm on image 25/series 2. Cholecystectomy, without biliary duct dilatation. Pancreas: Marked pancreatic atrophy without dominant mass or acute inflammation Spleen: Normal in size, without focal abnormality. Adrenals/Urinary Tract: Normal adrenal glands. Mild renal cortical thinning bilaterally. A too small to characterize upper pole left renal lesion. No hydronephrosis. Foley catheter within a decompressed urinary bladder. Stomach/Bowel: Normal stomach, without wall thickening. Scattered colonic diverticula. Normal small bowel caliber. Vascular/Lymphatic: Aortic and branch vessel atherosclerosis. No abdominopelvic adenopathy. Reproductive: Normal uterus. Right adnexal/ovarian low-density lesion is likely a residual follicle at 2.0 cm. Other: Moderate volume abdominal ascites. The abdominal ascites is  loculated, primarily positioned anteriorly (without significant fluid in the paracolic gutters). There is also adjacent omental thickening, including on image 56/series 2. Fluid identified within the lesser sac on image 35/series 2. Anasarca. Musculoskeletal: Advanced lumbosacral spondylosis. IMPRESSION: 1. Multifactorial degradation, including patient body habitus and lack of IV contrast. 2. Moderate volume ascites, primarily loculated anteriorly. Concurrent omental edema/thickening. Probable cirrhosis. Although ascites could be secondary to portal venous hypertension, given the omental edema, omental/peritoneal metastasis cannot be excluded. Correlate with primary malignancy history and the result of recent paracentesis. 3. Capsular based low-density foci within the liver are likely due to subcapsular fluid collections. Capsular based cysts felt less likely. 4. No definite primary malignancy identified within the abdomen or pelvis. 5. Right larger than left pleural effusions with right base atelectasis and left base infection versus atelectasis. 6. Pancreatic atrophy. Electronically Signed   By: Abigail Miyamoto M.D.   On: 11/20/2015 09:02   US Paracentesis  11/19/2015  INDICATION: Suspected cirrhosis. Chronic diastolic heart failure. Fluid overload. Ascites. Request for diagnostic paracentesis with 250 ml maximum. EXAM: ULTRASOUND GUIDED LEFT ANTERIOR ABDOMEN PARACENTESIS MEDICATIONS: 1% Lidocaine. COMPLICATIONS: None immediate. PROCEDURE: Informed written consent was obtained from the patient after a discussion of the risks, benefits and alternatives to treatment. A timeout was performed prior to the initiation of the procedure. Initial ultrasound scanning demonstrates a large amount of ascites within the right lower abdominal quadrant. The right lower abdomen was prepped and draped in the usual sterile fashion. 1% lidocaine with epinephrine was used for local anesthesia. Following this, a 19 gauge, 7-cm, Yueh  catheter was introduced. An ultrasound image was saved for documentation purposes. The paracentesis was performed. The catheter was removed and a dressing was applied. The patient tolerated the procedure well without immediate post procedural complication. FINDINGS: A total of approximately 250 ml of clear yellow fluid was removed. Samples were sent to the laboratory as requested by the clinical team. IMPRESSION: Successful ultrasound-guided paracentesis yielding 250 ml of peritoneal fluid. Read by:  Gareth Eagle, PA-C Electronically Signed   By: Markus Daft M.D.   On: 11/19/2015 14:42   . feeding supplement  1 Container Oral BID BM  . magic mouthwash  5 mL Oral TID  . metoprolol tartrate  12.5 mg Oral BID  . midodrine  10 mg Oral TID WC  . mometasone-formoterol  2 puff Inhalation BID  . octreotide  100 mcg Subcutaneous Q12H  . ondansetron (ZOFRAN) IV  4 mg Intravenous Once  . sertraline  25 mg Oral Daily  . sodium bicarbonate  650 mg Oral BID  . sodium chloride flush  3 mL Intravenous Q12H  . sodium chloride flush  3 mL Intravenous Q12H    BMET    Component Value Date/Time   NA 133* 11/20/2015 0807   K 5.1 11/20/2015 2025  CL 97* 11/20/2015 0807   CO2 20* 11/20/2015 0807   GLUCOSE 111* 11/20/2015 0807   BUN 95* 11/20/2015 0807   CREATININE 3.37* 11/20/2015 0807   CALCIUM 8.4* 11/20/2015 0807   GFRNONAA 12* 11/20/2015 0807   GFRAA 14* 11/20/2015 0807   CBC    Component Value Date/Time   WBC 16.7* 11/20/2015 0807   RBC 4.08 11/20/2015 0807   HGB 12.3 11/20/2015 0807   HCT 38.0 11/20/2015 0807   PLT 283 11/20/2015 0807   MCV 93.1 11/20/2015 0807   MCH 30.1 11/20/2015 0807   MCHC 32.4 11/20/2015 0807   RDW 15.5 11/20/2015 0807   LYMPHSABS 1.0 11/12/2015 1619   MONOABS 1.3* 11/12/2015 1619   EOSABS 0.0 11/12/2015 1619   BASOSABS 0.0 11/12/2015 1619   Assessment/Plan:  1. ARF, oliguric in setting of anasarca and likely cirrhosis (as seen on Abdominal US).  FeNa is low  consistent with hepatorenal syndrome, no proteinuria, bland urine sediment, normal ESR.  Serologies pending however this is most consistent with hepatorenal syndrome, and therefore dialysis is not indicated as she is not a liver transplant candidate and therefore will not offer HD.  Continue with current level of care and agree with palliative care input. UOP 275cc in 24hr. Bladder scan per nursing today. 1. Discussed with family and all are in agreement with no dialysis 2. Started octreotide and midodrine yesterday for probable hepatorenal syndrome and improvement of renal perfusion 2. Hyponatremia- related to Anasarca and ARF. 133 today 3. Anasarca- likely related to cirrhosis.   GI following. 4. CHF- ECHO with EF 45-50%.  5. Disposition- poor overall prognosis.  Agree with Palliative Care to help set goals/limits of care.  She is not a dialysis candidate and would not offer a trial of dialysis given likely hepatorenal syndrome.   Elberta Leatherwood, MD,MS,  PGY2 11/20/2015 12:56 PM  I have seen and examined this patient and agree with plan as outlined by Dr. Alease Frame.  Initial bladder scan revealed >1 liter but will ask to repeat and make sure this is not from her ascites, then proceed with I&O cath. Broadus John A Caty Tessler,MD 11/20/2015 1:22 PM

## 2015-11-21 DIAGNOSIS — R1084 Generalized abdominal pain: Secondary | ICD-10-CM

## 2015-11-21 DIAGNOSIS — R630 Anorexia: Secondary | ICD-10-CM

## 2015-11-21 DIAGNOSIS — N189 Chronic kidney disease, unspecified: Secondary | ICD-10-CM

## 2015-11-21 DIAGNOSIS — K769 Liver disease, unspecified: Secondary | ICD-10-CM

## 2015-11-21 LAB — CBC
HCT: 37.5 % (ref 36.0–46.0)
HEMOGLOBIN: 12.5 g/dL (ref 12.0–15.0)
MCH: 31 pg (ref 26.0–34.0)
MCHC: 33.3 g/dL (ref 30.0–36.0)
MCV: 93.1 fL (ref 78.0–100.0)
PLATELETS: 270 10*3/uL (ref 150–400)
RBC: 4.03 MIL/uL (ref 3.87–5.11)
RDW: 15.6 % — ABNORMAL HIGH (ref 11.5–15.5)
WBC: 17.5 10*3/uL — ABNORMAL HIGH (ref 4.0–10.5)

## 2015-11-21 LAB — RENAL FUNCTION PANEL
Albumin: 2.1 g/dL — ABNORMAL LOW (ref 3.5–5.0)
Anion gap: 17 — ABNORMAL HIGH (ref 5–15)
BUN: 105 mg/dL — AB (ref 6–20)
CALCIUM: 8.4 mg/dL — AB (ref 8.9–10.3)
CHLORIDE: 96 mmol/L — AB (ref 101–111)
CO2: 19 mmol/L — AB (ref 22–32)
CREATININE: 3.67 mg/dL — AB (ref 0.44–1.00)
GFR calc Af Amer: 13 mL/min — ABNORMAL LOW (ref >60)
GFR calc non Af Amer: 11 mL/min — ABNORMAL LOW (ref >60)
GLUCOSE: 119 mg/dL — AB (ref 65–99)
Phosphorus: 5.9 mg/dL — ABNORMAL HIGH (ref 2.5–4.6)
Potassium: 5.2 mmol/L — ABNORMAL HIGH (ref 3.5–5.1)
SODIUM: 132 mmol/L — AB (ref 135–145)

## 2015-11-21 LAB — IMMUNOFIXATION ELECTROPHORESIS
IGA: 190 mg/dL (ref 64–422)
IgG (Immunoglobin G), Serum: 811 mg/dL (ref 700–1600)
IgM, Serum: 118 mg/dL (ref 26–217)
Total Protein ELP: 5.8 g/dL — ABNORMAL LOW (ref 6.0–8.5)

## 2015-11-21 LAB — CULTURE, BODY FLUID-BOTTLE: CULTURE: NO GROWTH

## 2015-11-21 LAB — CA 125: CA 125: 626.6 U/mL — AB (ref 0.0–38.1)

## 2015-11-21 LAB — ANCA TITERS: Atypical P-ANCA titer: <1:20 {titer}

## 2015-11-21 LAB — AFP TUMOR MARKER: AFP TUMOR MARKER: 3.7 ng/mL (ref 0.0–8.3)

## 2015-11-21 LAB — CULTURE, BODY FLUID W GRAM STAIN -BOTTLE

## 2015-11-21 LAB — HEPARIN LEVEL (UNFRACTIONATED): HEPARIN UNFRACTIONATED: 0.4 [IU]/mL (ref 0.30–0.70)

## 2015-11-21 MED ORDER — FUROSEMIDE 10 MG/ML IJ SOLN
80.0000 mg | Freq: Two times a day (BID) | INTRAMUSCULAR | Status: DC
  Administered 2015-11-21 (×2): 80 mg via INTRAVENOUS
  Filled 2015-11-21 (×2): qty 8

## 2015-11-21 NOTE — Progress Notes (Signed)
Daily Progress Note   Patient Name: Kendra Martinez       Date: 11/21/2015 DOB: 1933/09/29  Age: 82 y.o. MRN#: [id number] Attending Physician: Theodis Blaze, MD Primary Care Physician: No PCP Per Patient Admit Date: 11/12/2015  Reason for Consultation/Follow-up: Establishing goals of care and Psychosocial/spiritual support  Subjective:  - Continued conversation at bedside, patient and family clearly are expressing desire for liver biopsy if there "is a chance for treatment", if "it is only for diagnosis" they do not want it.      They tell me the oncologist told them "there is a small chance that there is something we could do".  This family needs very clear and specific guidance in helping them make  these difficult decisions.  -PMT will continue to support holistically   Length of Stay: 9 days  Current Medications: Scheduled Meds:  . furosemide  80 mg Intravenous Q12H  . magic mouthwash  5 mL Oral TID  . metoprolol tartrate  12.5 mg Oral BID  . midodrine  10 mg Oral TID WC  . mometasone-formoterol  2 puff Inhalation BID  . octreotide  100 mcg Subcutaneous Q12H  . ondansetron (ZOFRAN) IV  4 mg Intravenous Once  . sertraline  25 mg Oral Daily  . sodium bicarbonate  650 mg Oral BID  . sodium chloride flush  3 mL Intravenous Q12H  . sodium chloride flush  3 mL Intravenous Q12H    Continuous Infusions: . heparin 1,050 Units/hr (11/20/15 2138)    PRN Meds: sodium chloride, acetaminophen **OR** acetaminophen, ALPRAZolam, HYDROcodone-acetaminophen, ondansetron **OR** ondansetron (ZOFRAN) IV, prochlorperazine, sodium chloride flush, traMADol  Physical Exam  Constitutional: She appears ill.  -generalized weakness  Cardiovascular: Normal rate and regular rhythm.   Pulmonary/Chest: She  has decreased breath sounds.  Abdominal: Soft.  Neurological: She is alert.  Skin: Skin is warm and dry.                Vital Signs: BP 107/77 mmHg  Pulse 79  Temp(Src) 98 F (36.7 C) (Axillary)  Resp 16  Ht 5\' 3"  (1.6 m)  Wt 125.4 kg (276 lb 7.3 oz)  BMI 48.98 kg/m2  SpO2 97% SpO2: SpO2: 97 % O2 Device: O2 Device: Nasal Cannula O2 Flow Rate: O2 Flow Rate (L/min): 1.5 L/min  Intake/output summary:  Intake/Output  Summary (Last 24 hours) at 11/21/15 1640 Last data filed at 11/21/15 0600  Gross per 24 hour  Intake  250.5 ml  Output    250 ml  Net    0.5 ml   LBM: Last BM Date: 11/20/15 Baseline Weight: Weight: 117.028 kg (258 lb) Most recent weight: Weight: 125.4 kg (276 lb 7.3 oz)       Palliative Assessment/Data: 20%      Patient Active Problem List   Diagnosis Date Noted  . Palliative care encounter 11/20/2015  . DNR (do not resuscitate) 11/20/2015  . Parapneumonic effusion   . Acute encephalopathy   . Liver lesion   . Arterial hypotension   . ARF (acute renal failure) ([place])   . Cirrhosis of liver with ascites ([place])   . Elevated troponin 11/13/2015  . Acute respiratory failure with hypoxemia ([place])   . Ascites   . Acute on chronic diastolic CHF (congestive heart failure), NYHA class 4 ([place])   . Pleural effusion 11/12/2015  . Essential hypertension 11/12/2015  . Hyponatremia 11/12/2015  . Sepsis ([place]) 11/12/2015  . Anasarca 11/12/2015  . New onset a-fib ([place]) 11/12/2015  . Abdominal distention   . Hypoxia   . Increased anion gap metabolic acidosis   . Lactic acidosis   . Acute respiratory failure with hypoxia [facility])     Palliative Care Assessment & Plan    Assessment:   Patient and family are open to all offered and avaiabel medical interventions to prolong life, they hope for improvemtn   Code Status:    Code Status Orders        Start     Ordered   11/19/15 1157  Do not attempt resuscitation (DNR)   Continuous    Question Answer Comment    Maintain current active treatments Yes   Do not initiate new interventions No      11/19/15 1156    Code Status History    Date Active Date Inactive Code Status Order ID Comments User Context   11/13/2015  3:36 AM 11/19/2015 11:56 AM Full Code [id number]  Toy Baker, MD ED    Advance Directive Documentation        Most Recent Value   Type of Advance Directive  Living will   Pre-existing out of facility DNR order (yellow form or pink MOST form)     "MOST" Form in Place?         Prognosis:  Overall prognosis is poor   Thank you for allowing the Palliative Medicine Team to assist in the care of this patient.   Time In:  1630 Time Out: 1700 Total Time 30 min Prolonged Time Billed  no       Greater than 50%  of this time was spent counseling and coordinating care related to the above assessment and plan.  Wadie Lessen, NP  Please contact Palliative Medicine Team phone at [phone] for questions and concerns.

## 2015-11-21 NOTE — Progress Notes (Signed)
ANTICOAGULATION CONSULT NOTE - Follow Up Consult  Pharmacy Consult:  Heparin Indication: atrial fibrillation  Allergies  Allergen Reactions  . Sulfa Antibiotics Hives    Patient Measurements: Height: 5\' 3"  (160 cm) Weight: 276 lb 7.3 oz (125.4 kg) IBW/kg (Calculated) : 52.4 Heparin Dosing Weight: 82 kg  Vital Signs: Temp: 98 F (36.7 C) (04/26 0755) Temp Source: Axillary (04/26 0755) BP: 107/77 mmHg (04/26 0755) Pulse Rate: 79 (04/26 0755)  Labs:  Recent Labs  11/19/15 0418 11/20/15 0807 11/21/15 0554  HGB 12.1 12.3 12.5  HCT 37.3 38.0 37.5  PLT 229 283 270  HEPARINUNFRC 0.60 0.47 0.40  CREATININE 3.44* 3.37* 3.67*    Estimated Creatinine Clearance: 16 mL/min (by C-G formula based on Cr of 3.67).   Assessment: 21 YOF with new-onset AFib.  HL therapeutic.  Goal of Therapy:  Heparin level 0.3-0.7 units/ml Monitor platelets by anticoagulation protocol: Yes    Plan:  - Continue heparin infusion at 1050 units/hr - Daily HL, CBC - Monitor s/s of bleeding  Thank you Anette Guarneri, PharmD [phone] 11/21/2015 12:05 PM

## 2015-11-21 NOTE — Progress Notes (Signed)
Patient ID: Kendra Martinez, female   DOB: February 06, 1934, 82 y.o.   MRN: [id number] S: Feels well this AM. Surrounded by family. Obviously somewhat upset w/ news about liver lesions.  O:BP 107/77 mmHg  Pulse 79  Temp(Src) 98 F (36.7 C) (Axillary)  Resp 16  Ht _0  (1.6 m)  Wt 276 lb 7.3 oz (125.4 kg)  BMI 48.98 kg/m2  SpO2 97%  Intake/Output Summary (Last 24 hours) at 11/21/15 1417 Last data filed at 11/21/15 0600  Gross per 24 hour  Intake  271.5 ml  Output    250 ml  Net   21.5 ml   Intake/Output: I/O last 3 completed shifts: In: 1430.1 [P.O.:1080; I.V.:350.1] Out: 425 [Urine:425]  Intake/Output this shift:    Weight change: 6 lb 2.8 oz (2.8 kg) UTM:LYYTK elderly, pale WF in NAD CVS:no rub Resp:decreased BS at bases PTW:SFKCLEXNT, obese, nontender Ext: 1+ edema   Recent Labs Lab 11/15/15 0346 11/16/15 0359 11/17/15 0218 11/18/15 0606 11/19/15 0418 11/20/15 0807 11/21/15 0554  NA 136 137 133* 136 136 133* 132*  K 3.9 4.1 4.3 4.1 4.3 5.1 5.2*  CL 97* 99* 99* 97* 98* 97* 96*  CO2 21* 22 17* 21* 23 20* 19*  GLUCOSE 103* 103* 108* 109* 103* 111* 119*  BUN 65* 70* 76* 84* 92* 95* 105*  CREATININE 2.73* 2.87* 2.84* 3.09* 3.44* 3.37* 3.67*  ALBUMIN  --  2.0*  --   --   --  2.0* 2.1*  CALCIUM 8.0* 8.2* 8.2* 8.5* 8.4* 8.4* 8.4*  PHOS  --   --   --   --   --  5.9* 5.9*  AST  --  21  --   --   --   --   --   ALT  --  15  --   --   --   --   --    Liver Function Tests:  Recent Labs Lab 11/16/15 0359 11/20/15 0807 11/21/15 0554  AST 21  --   --   ALT 15  --   --   ALKPHOS 56  --   --   BILITOT 1.0  --   --   PROT 5.3*  5.2*  --   --   ALBUMIN 2.0* 2.0* 2.1*   No results for input(s): LIPASE, AMYLASE in the last 168 hours.  Recent Labs Lab 11/19/15 0418  AMMONIA 33   CBC:  Recent Labs Lab 11/17/15 0218 11/18/15 0606 11/19/15 0418 11/20/15 0807 11/21/15 0554  WBC 18.0* 17.6* 16.4* 16.7* 17.5*  HGB 12.1 12.2 12.1 12.3 12.5  HCT 36.3 37.3 37.3 38.0  37.5  MCV 92.8 93.0 93.3 93.1 93.1  PLT 236 244 229 283 270   Cardiac Enzymes: No results for input(s): CKTOTAL, CKMB, CKMBINDEX, TROPONINI in the last 168 hours. CBG: No results for input(s): GLUCAP in the last 168 hours.  Iron Studies: No results for input(s): IRON, TIBC, TRANSFERRIN, FERRITIN in the last 72 hours. Studies/Results: Ct Abdomen Pelvis Wo Contrast  11/20/2015  CLINICAL DATA:  Suspected cirrhosis. Ascites. Diffuse abdominal pain. EXAM: CT ABDOMEN AND PELVIS WITHOUT CONTRAST TECHNIQUE: Multidetector CT imaging of the abdomen and pelvis was performed following the standard protocol without IV contrast. COMPARISON:  11/14/2015 abdominal ultrasound. Chest CT of 11/12/2015. FINDINGS: Lower chest: Bibasilar airspace disease, worse on the left. Mild cardiomegaly. Trace left and moderate right pleural effusions. Hepatobiliary: Multifactorial degradation, including lack of IV contrast and patient body habitus. Old granulomatous disease in the liver. Suspicion  of cirrhosis on prior ultrasound. Less apparent today onCT. Capsular based low-density foci are likely subcapsular fluid collections. Example straddling the right and left lobe of the liver at 8.1 x 9.5 cm on image 25/series 2. Cholecystectomy, without biliary duct dilatation. Pancreas: Marked pancreatic atrophy without dominant mass or acute inflammation Spleen: Normal in size, without focal abnormality. Adrenals/Urinary Tract: Normal adrenal glands. Mild renal cortical thinning bilaterally. A too small to characterize upper pole left renal lesion. No hydronephrosis. Foley catheter within a decompressed urinary bladder. Stomach/Bowel: Normal stomach, without wall thickening. Scattered colonic diverticula. Normal small bowel caliber. Vascular/Lymphatic: Aortic and branch vessel atherosclerosis. No abdominopelvic adenopathy. Reproductive: Normal uterus. Right adnexal/ovarian low-density lesion is likely a residual follicle at 2.0 cm. Other:  Moderate volume abdominal ascites. The abdominal ascites is loculated, primarily positioned anteriorly (without significant fluid in the paracolic gutters). There is also adjacent omental thickening, including on image 56/series 2. Fluid identified within the lesser sac on image 35/series 2. Anasarca. Musculoskeletal: Advanced lumbosacral spondylosis. IMPRESSION: 1. Multifactorial degradation, including patient body habitus and lack of IV contrast. 2. Moderate volume ascites, primarily loculated anteriorly. Concurrent omental edema/thickening. Probable cirrhosis. Although ascites could be secondary to portal venous hypertension, given the omental edema, omental/peritoneal metastasis cannot be excluded. Correlate with primary malignancy history and the result of recent paracentesis. 3. Capsular based low-density foci within the liver are likely due to subcapsular fluid collections. Capsular based cysts felt less likely. 4. No definite primary malignancy identified within the abdomen or pelvis. 5. Right larger than left pleural effusions with right base atelectasis and left base infection versus atelectasis. 6. Pancreatic atrophy. Electronically Signed   By: Abigail Miyamoto M.D.   On: 11/20/2015 09:02   . furosemide  80 mg Intravenous Q12H  . magic mouthwash  5 mL Oral TID  . metoprolol tartrate  12.5 mg Oral BID  . midodrine  10 mg Oral TID WC  . mometasone-formoterol  2 puff Inhalation BID  . octreotide  100 mcg Subcutaneous Q12H  . ondansetron (ZOFRAN) IV  4 mg Intravenous Once  . sertraline  25 mg Oral Daily  . sodium bicarbonate  650 mg Oral BID  . sodium chloride flush  3 mL Intravenous Q12H  . sodium chloride flush  3 mL Intravenous Q12H    BMET    Component Value Date/Time   NA 132* 11/21/2015 0554   K 5.2* 11/21/2015 0554   CL 96* 11/21/2015 0554   CO2 19* 11/21/2015 0554   GLUCOSE 119* 11/21/2015 0554   BUN 105* 11/21/2015 0554   CREATININE 3.67* 11/21/2015 0554   CALCIUM 8.4*  11/21/2015 0554   GFRNONAA 11* 11/21/2015 0554   GFRAA 13* 11/21/2015 0554   CBC    Component Value Date/Time   WBC 17.5* 11/21/2015 0554   RBC 4.03 11/21/2015 0554   HGB 12.5 11/21/2015 0554   HCT 37.5 11/21/2015 0554   PLT 270 11/21/2015 0554   MCV 93.1 11/21/2015 0554   MCH 31.0 11/21/2015 0554   MCHC 33.3 11/21/2015 0554   RDW 15.6* 11/21/2015 0554   LYMPHSABS 1.0 11/12/2015 1619   MONOABS 1.3* 11/12/2015 1619   EOSABS 0.0 11/12/2015 1619   BASOSABS 0.0 11/12/2015 1619   Assessment/Plan:  1. ARF, oliguric in setting of anasarca and likely cirrhosis (as seen on Abdominal US).  FeNa is low consistent with hepatorenal syndrome, no proteinuria, bland urine sediment, normal ESR. Hepatorenal syndrome, therefore dialysis is not indicated as she is not a liver transplant candidate and therefore  will not offer HD.  Continue with current level of care and agree with palliative care input. UOP 250cc in 24hr. Bladder scan per nursing today. 1. Discussed with family and all are in agreement with no dialysis 2. Started octreotide and midodrine for probable hepatorenal syndrome and improvement of renal perfusion 3. Restart Lasix: 72m BID 2. Hyponatremia- related to Anasarca and ARF. 132 today 3. Anasarca-  GI following. CA125 elevated to 626.6. Oncology on board. After discussion w/ family it looks like they are wanting a liver bx. 4. CHF- ECHO with EF 45-50%.  5. Disposition- poor overall prognosis.  She is not a dialysis candidate.   IElberta Leatherwood MD,MS,  PGY2 11/21/2015 2:17 PM  I have seen and examined this patient and agree with plan as outlined by Dr. MAlease Frame  Unfortunate situation with underlying malignancy and cirrhosis complicated by AKI.  She is not a dialysis candidate nor chemo.  [place] which is what the family would like.  Added lasix for comfort.  Poor prognosis. JGovernor RooksColadonato,MD 11/21/2015 2:41 PM

## 2015-11-21 NOTE — Progress Notes (Signed)
Patient ID: Kendra Martinez, female   DOB: [birth year], 82 y.o.   MRN: [id number]    PROGRESS NOTE    Kendra Martinez  QXI:[id number] DOB: 23-Sep-1933 DOA: 11/12/2015  PCP: No PCP Per Patient   Outpatient Specialists:   Brief Narrative:  82 year old Caucasian female with hypertension, diastolic CHF, recently treated pneumonia at an outside facility, presented with shortness of breath. Patient is new to [place]. She has recently relocated to this area. Does not see any providers here. Patient was seen by multiple specialists while hospitalized including cardiology, pulmonology, nephrology and gastroenterology. She was found to have a new diagnosis of liver cirrhosis with ascites, new right pleural effusion. She underwent thoracentesis which was notable for presence of malignant cells, etiology remains unclear.   Assessment & Plan:  Acute on chronic kidney disease/Hepatorenal syndrome/hyperkalemia  - oliguric in setting of anasarca and likely cirrhosis (as seen on Abdominal US). - FeNa is low consistent with hepatorenal syndrome, no proteinuria, bland urine sediment, normal ESR - pt determined not to be HD candidate - lasix added per nephrology, more for comfort, but overall prognosis guarded with recommendation to focus on comfort   Liver cirrhosis of unclear etiology/hepatic cysts - Liver cysts also noted. - per GI team, patient's ascitic fluid does not support cirrhosis (SAG << 1.1) as the primary cause for her ascites, evidence for hepatic cirrhosis is weak and is limited to the suggestion of cirrhosis on one ultrasound (but not others) in the presence of ascites. - GI recommended focusing on areas other than the liver regarding her anasarca and ascites. No further recommendations. GI signed off 4/25 - d/w with IR, can not do anything about liver cysts but can do omental biopsy, to d/w with pt and family after PCT sees   Right-sided pleural effusion - malignant cells noted, etiology remains  unclear   Anasarca with ascites, pleural effusion - not HD candidate - lasix per nephrology  Nausea and vomiting - better this AM, denies either  - tolerating breakfast well   Hypotension - reasonably stable this AM   New onset atrial fibrillation - CHADs2vasc score is 3. Patient started on intravenous heparin.  - continue with [place] discussions, pt wants to be off tele, respect wishes   Recent Pneumonia in early April - Cultures negative so far. Antibiotics were discontinued 4/20.  - Sepsis appears to have been ruled out.  Mildly elevated troponin - Likely demand ischemia. Cardiology is following..  Yeast in urine - Patient completed a course of Diflucan.   DVT prophylaxis: IV Heparin  Code Status: DNR Family Communication: Patient and family at bedside  Disposition Plan: Unclear at this point, pending [place] discussions   Consultants:   Cardio  Nephrology  PCT  GI  IR  Procedures:   None  Antimicrobials:   Completed    Subjective: Reports feeling better, denies vomiting   Objective: Filed Vitals:   11/21/15 0032 11/21/15 0302 11/21/15 0755 11/21/15 0931  BP: 115/87 111/64 107/77   Pulse: 88 84 79   Temp: 98.5 F (36.9 C) 98.3 F (36.8 C) 98 F (36.7 C)   TempSrc: Axillary Axillary Axillary   Resp: 18 20 16    Height:      Weight:  125.4 kg (276 lb 7.3 oz)    SpO2: 98% 97% 98% 97%    Intake/Output Summary (Last 24 hours) at 11/21/15 1620 Last data filed at 11/21/15 0600  Gross per 24 hour  Intake  250.5 ml  Output    250  ml  Net    0.5 ml   Filed Weights   11/19/15 0459 11/20/15 0500 11/21/15 0302  Weight: 121.11 kg (267 lb) 122.6 kg (270 lb 4.5 oz) 125.4 kg (276 lb 7.3 oz)    Examination:  General exam: Appears calm and comfortable  Respiratory system: Dullness to percussion in the lower lobes  Cardiovascular system: S1 & S2 heard, No JVD, murmurs, rubs, gallops or clicks. No pedal edema. Gastrointestinal system: Abdomen is  distended, nontender.  Central nervous system: Alert and oriented. No focal neurological deficits. Psychiatry: Judgement and insight appear normal.    Data Reviewed: I have personally reviewed following labs and imaging studies  CBC:  Recent Labs Lab 11/17/15 0218 11/18/15 0606 11/19/15 0418 11/20/15 0807 11/21/15 0554  WBC 18.0* 17.6* 16.4* 16.7* 17.5*  HGB 12.1 12.2 12.1 12.3 12.5  HCT 36.3 37.3 37.3 38.0 37.5  MCV 92.8 93.0 93.3 93.1 93.1  PLT 236 244 229 283 338   Basic Metabolic Panel:  Recent Labs Lab 11/17/15 0218 11/18/15 0606 11/19/15 0418 11/20/15 0807 11/21/15 0554  NA 133* 136 136 133* 132*  K 4.3 4.1 4.3 5.1 5.2*  CL 99* 97* 98* 97* 96*  CO2 17* 21* 23 20* 19*  GLUCOSE 108* 109* 103* 111* 119*  BUN 76* 84* 92* 95* 105*  CREATININE 2.84* 3.09* 3.44* 3.37* 3.67*  CALCIUM 8.2* 8.5* 8.4* 8.4* 8.4*  PHOS  --   --   --  5.9* 5.9*   Liver Function Tests:  Recent Labs Lab 11/16/15 0359 11/20/15 0807 11/21/15 0554  AST 21  --   --   ALT 15  --   --   ALKPHOS 56  --   --   BILITOT 1.0  --   --   PROT 5.3*  5.2*  --   --   ALBUMIN 2.0* 2.0* 2.1*   No results for input(s): LIPASE, AMYLASE in the last 168 hours.  Recent Labs Lab 11/19/15 0418  AMMONIA 33   Coagulation Profile:  Recent Labs Lab 11/16/15 0359  INR 1.22   Cardiac Enzymes: No results for input(s): CKTOTAL, CKMB, CKMBINDEX, TROPONINI in the last 168 hours. BNP (last 3 results) No results for input(s): PROBNP in the last 8760 hours. HbA1C: No results for input(s): HGBA1C in the last 72 hours. CBG: No results for input(s): GLUCAP in the last 168 hours. Lipid Profile: No results for input(s): CHOL, HDL, LDLCALC, TRIG, CHOLHDL, LDLDIRECT in the last 72 hours. Thyroid Function Tests: No results for input(s): TSH, T4TOTAL, FREET4, T3FREE, THYROIDAB in the last 72 hours. Anemia Panel: No results for input(s): VITAMINB12, FOLATE, FERRITIN, TIBC, IRON, RETICCTPCT in the last 72  hours. Urine analysis:    Component Value Date/Time   COLORURINE YELLOW 11/13/2015 1132   APPEARANCEUR CLOUDY* 11/13/2015 1132   LABSPEC 1.023 11/13/2015 1132   PHURINE 5.5 11/13/2015 1132   GLUCOSEU NEGATIVE 11/13/2015 1132   HGBUR NEGATIVE 11/13/2015 1132   BILIRUBINUR MODERATE* 11/13/2015 1132   KETONESUR NEGATIVE 11/13/2015 1132   PROTEINUR 30* 11/13/2015 1132   NITRITE NEGATIVE 11/13/2015 1132   LEUKOCYTESUR MODERATE* 11/13/2015 1132   Sepsis Labs: @LABRCNTIP (procalcitonin:4,lacticidven:4)  ) Recent Results (from the past 240 hour(s))  Blood culture (routine x 2)     Status: None   Collection Time: 11/12/15  8:20 PM  Result Value Ref Range Status   Specimen Description BLOOD RIGHT ANTECUBITAL  Final   Special Requests IN PEDIATRIC BOTTLE 3CC  Final   Culture NO GROWTH 5 DAYS  Final   Report Status 11/17/2015 FINAL  Final  Blood culture (routine x 2)     Status: None   Collection Time: 11/12/15  8:25 PM  Result Value Ref Range Status   Specimen Description BLOOD RIGHT HAND  Final   Special Requests IN PEDIATRIC BOTTLE 3CC  Final   Culture NO GROWTH 5 DAYS  Final   Report Status 11/17/2015 FINAL  Final  Urine culture     Status: Abnormal   Collection Time: 11/13/15 11:32 AM  Result Value Ref Range Status   Specimen Description URINE, CLEAN CATCH  Final   Special Requests NONE  Final   Culture 60,000 COLONIES/mL YEAST (A)  Final   Report Status 11/14/2015 FINAL  Final  MRSA PCR Screening     Status: None   Collection Time: 11/13/15  5:21 PM  Result Value Ref Range Status   MRSA by PCR NEGATIVE NEGATIVE Final    Comment:        The GeneXpert MRSA Assay (FDA approved for NASAL specimens only), is one component of a comprehensive MRSA colonization surveillance program. It is not intended to diagnose MRSA infection nor to guide or monitor treatment for MRSA infections.   Culture, body fluid-bottle     Status: None (Preliminary result)   Collection Time:  11/16/15 12:40 PM  Result Value Ref Range Status   Specimen Description FLUID RIGHT PLEURAL  Final   Special Requests BOTTLES DRAWN AEROBIC AND ANAEROBIC 3CC  Final   Culture NO GROWTH 4 DAYS  Final   Report Status PENDING  Incomplete  Gram stain     Status: None   Collection Time: 11/16/15 12:40 PM  Result Value Ref Range Status   Specimen Description FLUID RIGHT PLEURAL  Final   Special Requests NONE  Final   Gram Stain   Final    ABUNDANT WBC PRESENT,BOTH PMN AND MONONUCLEAR NO ORGANISMS SEEN    Report Status 11/16/2015 FINAL  Final  Culture, body fluid-bottle     Status: None (Preliminary result)   Collection Time: 11/19/15 12:44 PM  Result Value Ref Range Status   Specimen Description FLUID PERITONEAL  Final   Special Requests BOTTLES DRAWN AEROBIC AND ANAEROBIC 8CC  Final   Culture NO GROWTH < 24 HOURS  Final   Report Status PENDING  Incomplete  Gram stain     Status: None   Collection Time: 11/19/15 12:44 PM  Result Value Ref Range Status   Specimen Description FLUID PERITONEAL  Final   Special Requests NONE  Final   Gram Stain   Final    WBC PRESENT,BOTH PMN AND MONONUCLEAR NO ORGANISMS SEEN CYTOSPIN SMEAR    Report Status 11/19/2015 FINAL  Final      Radiology Studies: Ct Abdomen Pelvis Wo Contrast  11/20/2015  CLINICAL DATA:  Suspected cirrhosis. Ascites. Diffuse abdominal pain. EXAM: CT ABDOMEN AND PELVIS WITHOUT CONTRAST TECHNIQUE: Multidetector CT imaging of the abdomen and pelvis was performed following the standard protocol without IV contrast. COMPARISON:  11/14/2015 abdominal ultrasound. Chest CT of 11/12/2015. FINDINGS: Lower chest: Bibasilar airspace disease, worse on the left. Mild cardiomegaly. Trace left and moderate right pleural effusions. Hepatobiliary: Multifactorial degradation, including lack of IV contrast and patient body habitus. Old granulomatous disease in the liver. Suspicion of cirrhosis on prior ultrasound. Less apparent today onCT. Capsular  based low-density foci are likely subcapsular fluid collections. Example straddling the right and left lobe of the liver at 8.1 x 9.5 cm on image 25/series 2. Cholecystectomy, without  biliary duct dilatation. Pancreas: Marked pancreatic atrophy without dominant mass or acute inflammation Spleen: Normal in size, without focal abnormality. Adrenals/Urinary Tract: Normal adrenal glands. Mild renal cortical thinning bilaterally. A too small to characterize upper pole left renal lesion. No hydronephrosis. Foley catheter within a decompressed urinary bladder. Stomach/Bowel: Normal stomach, without wall thickening. Scattered colonic diverticula. Normal small bowel caliber. Vascular/Lymphatic: Aortic and branch vessel atherosclerosis. No abdominopelvic adenopathy. Reproductive: Normal uterus. Right adnexal/ovarian low-density lesion is likely a residual follicle at 2.0 cm. Other: Moderate volume abdominal ascites. The abdominal ascites is loculated, primarily positioned anteriorly (without significant fluid in the paracolic gutters). There is also adjacent omental thickening, including on image 56/series 2. Fluid identified within the lesser sac on image 35/series 2. Anasarca. Musculoskeletal: Advanced lumbosacral spondylosis. IMPRESSION: 1. Multifactorial degradation, including patient body habitus and lack of IV contrast. 2. Moderate volume ascites, primarily loculated anteriorly. Concurrent omental edema/thickening. Probable cirrhosis. Although ascites could be secondary to portal venous hypertension, given the omental edema, omental/peritoneal metastasis cannot be excluded. Correlate with primary malignancy history and the result of recent paracentesis. 3. Capsular based low-density foci within the liver are likely due to subcapsular fluid collections. Capsular based cysts felt less likely. 4. No definite primary malignancy identified within the abdomen or pelvis. 5. Right larger than left pleural effusions with right  base atelectasis and left base infection versus atelectasis. 6. Pancreatic atrophy. Electronically Signed   By: Abigail Miyamoto M.D.   On: 11/20/2015 09:02      Scheduled Meds: . furosemide  80 mg Intravenous Q12H  . magic mouthwash  5 mL Oral TID  . metoprolol tartrate  12.5 mg Oral BID  . midodrine  10 mg Oral TID WC  . mometasone-formoterol  2 puff Inhalation BID  . octreotide  100 mcg Subcutaneous Q12H  . ondansetron (ZOFRAN) IV  4 mg Intravenous Once  . sertraline  25 mg Oral Daily  . sodium bicarbonate  650 mg Oral BID  . sodium chloride flush  3 mL Intravenous Q12H  . sodium chloride flush  3 mL Intravenous Q12H   Continuous Infusions: . heparin 1,050 Units/hr (11/20/15 2138)     LOS: 9 days   Time spent: 20 minutes   Faye Ramsay, MD [facility] Pager [phone]  If 7PM-7AM, please contact night-coverage [email or web address] Password [email or web address] 11/21/2015, 4:20 PM

## 2015-11-21 NOTE — Progress Notes (Signed)
Nutrition Follow-up  DOCUMENTATION CODES:   Morbid obesity  INTERVENTION:    Magic Cups TID with meals  Vanilla Mighty Shakes with meals  NUTRITION DIAGNOSIS:   Inadequate oral intake related to poor appetite as evidenced by per patient/family report.  Ongoing  GOAL:   Patient will meet greater than or equal to 90% of their needs  Unmet  MONITOR:   PO intake, Supplement acceptance, Labs, Weight trends  ASSESSMENT:   82 year old female admitted on 4/17 with worsening shortness of breath.  Nutrition-Focused physical exam completed. Findings are no fat depletion, no muscle depletion, and moderate edema. Patient with ongoing poor appetite and poor intake. She thinks her intake has improved some, but she is consuming < 25% of meals. She does not like the Ensure or Breeze supplements. She has been receiving [employer], she prefers vanilla flavor.  Diet Order:  Diet Heart Room service appropriate?: Yes; Fluid consistency:: Thin  Skin:  Wound (see comment) (wound to thigh)  Last BM:  4/25  Height:   Ht Readings from Last 1 Encounters:  11/13/15 5\' 3"  (1.6 m)    Weight:   Wt Readings from Last 1 Encounters:  11/21/15 276 lb 7.3 oz (125.4 kg)    Ideal Body Weight:  52.3 kg  BMI:  Body mass index is 48.98 kg/(m^2).  Estimated Nutritional Needs:   Kcal:  1800-2000  Protein:  110-130 gm  Fluid:  1.8-2 L  EDUCATION NEEDS:   No education needs identified at this time  Molli Barrows, [place], [place], [place] Pager [phone] After Hours Pager [phone]

## 2015-11-22 LAB — CBC
HEMATOCRIT: 37.7 % (ref 36.0–46.0)
HEMOGLOBIN: 12.2 g/dL (ref 12.0–15.0)
MCH: 30.4 pg (ref 26.0–34.0)
MCHC: 32.4 g/dL (ref 30.0–36.0)
MCV: 94 fL (ref 78.0–100.0)
Platelets: 308 10*3/uL (ref 150–400)
RBC: 4.01 MIL/uL (ref 3.87–5.11)
RDW: 15.8 % — AB (ref 11.5–15.5)
WBC: 14.8 10*3/uL — AB (ref 4.0–10.5)

## 2015-11-22 LAB — RENAL FUNCTION PANEL
ALBUMIN: 2 g/dL — AB (ref 3.5–5.0)
Anion gap: 16 — ABNORMAL HIGH (ref 5–15)
BUN: 110 mg/dL — ABNORMAL HIGH (ref 6–20)
CALCIUM: 8.4 mg/dL — AB (ref 8.9–10.3)
CO2: 21 mmol/L — AB (ref 22–32)
CREATININE: 3.47 mg/dL — AB (ref 0.44–1.00)
Chloride: 96 mmol/L — ABNORMAL LOW (ref 101–111)
GFR calc Af Amer: 13 mL/min — ABNORMAL LOW (ref >60)
GFR calc non Af Amer: 12 mL/min — ABNORMAL LOW (ref >60)
GLUCOSE: 102 mg/dL — AB (ref 65–99)
PHOSPHORUS: 6.1 mg/dL — AB (ref 2.5–4.6)
Potassium: 5.3 mmol/L — ABNORMAL HIGH (ref 3.5–5.1)
SODIUM: 133 mmol/L — AB (ref 135–145)

## 2015-11-22 LAB — HEPARIN LEVEL (UNFRACTIONATED): HEPARIN UNFRACTIONATED: 0.37 [IU]/mL (ref 0.30–0.70)

## 2015-11-22 MED ORDER — HYDROMORPHONE HCL 1 MG/ML IJ SOLN
1.0000 mg | INTRAMUSCULAR | Status: DC | PRN
  Administered 2015-11-23 (×2): 1 mg via INTRAVENOUS
  Filled 2015-11-22 (×2): qty 1

## 2015-11-22 MED ORDER — LORAZEPAM 2 MG/ML IJ SOLN
0.2500 mg | INTRAMUSCULAR | Status: DC | PRN
  Administered 2015-11-22 – 2015-11-23 (×3): 0.25 mg via INTRAVENOUS
  Filled 2015-11-22 (×3): qty 1

## 2015-11-22 NOTE — Progress Notes (Signed)
Patient's family requested [facility]. CSW faxed referral to [facility] for review.  Percell Locus Aura Bibby LCSWA [phone]

## 2015-11-22 NOTE — Progress Notes (Signed)
Patient ID: Kendra Martinez, female   DOB: 1934-02-06, 82 y.o.   MRN: [id number]    PROGRESS NOTE    Shiane Wenberg  RFV:[id number] DOB: 06-Jul-1934 DOA: 11/12/2015  PCP: No PCP Per Patient   Outpatient Specialists:   Brief Narrative:  82 year old Caucasian female with hypertension, diastolic CHF, recently treated pneumonia at an outside facility, presented with shortness of breath. Patient is new to [place]. She has recently relocated to this area. Does not see any providers here. Patient was seen by multiple specialists while hospitalized including cardiology, pulmonology, nephrology and gastroenterology. She was found to have a new diagnosis of liver cirrhosis with ascites, new right pleural effusion. She underwent thoracentesis which was notable for presence of malignant cells, etiology remains unclear.   Assessment & Plan:  Acute on chronic kidney disease/Hepatorenal syndrome/hyperkalemia/hyponatremia  - oliguric in setting of anasarca and likely cirrhosis (as seen on Abdominal US). - FeNa is low consistent with hepatorenal syndrome, no proteinuria, bland urine sediment, normal ESR - pt determined not to be HD candidate - lasix added per nephrology, more for comfort, but overall prognosis guarded with recommendation to focus on comfort  - PCT consulted, assistance is appreciated   Liver cirrhosis of unclear etiology/hepatic cysts - Liver cysts also noted. - per GI team, patient's ascitic fluid does not support cirrhosis (SAG << 1.1) as the primary cause for her ascites, evidence for hepatic cirrhosis is weak and is limited to the suggestion of cirrhosis on one ultrasound (but not others) in the presence of ascites. - GI recommended focusing on areas other than the liver regarding her anasarca and ascites. No further recommendations. GI signed off 4/25 - d/w with IR, can not do anything about liver cysts but can do omental biopsy - after discussion with PCT, oncology team, plan to focus on  comfort - plan to d/c to residential hospice   Right-sided pleural effusion - malignant cells noted, etiology remains unclear  - no further work up to ensure comfort   Anasarca with ascites, pleural effusion - not HD candidate - lasix per nephrology  Nausea and vomiting - worse this AM, refusing oral intake  - nausea but no vomiting noted, provide antiemetics as needed   Hypotension - reasonably stable this AM   New onset atrial fibrillation - CHADs2vasc score is 3. Patient started on intravenous heparin.  - transition to full comfort   Recent Pneumonia in early April - Cultures negative so far. Antibiotics were discontinued 4/20.  - Sepsis appears to have been ruled out.  Mildly elevated troponin - Likely demand ischemia. Cardiology is following..  Yeast in urine - Patient completed a course of Diflucan.   DVT prophylaxis: transition to off Heparin to ensure comfort  Code Status: DNR Family Communication: Patient and family at bedside  Disposition Plan: Residential hospice   Consultants:   [place]  Nephrology  PCT  GI  IR  Procedures:   None  Antimicrobials:  Completed   Subjective: Reports feeling tired   Objective: Filed Vitals:   11/21/15 1630 11/21/15 1945 11/22/15 0138 11/22/15 0433  BP: 123/79 101/56 114/80 98/79  Pulse: 96 75 105 75  Temp: 97.8 F (36.6 C) 98.6 F (37 C) 98.6 F (37 C) 98.4 F (36.9 C)  TempSrc: Axillary Axillary Oral Oral  Resp: _0 Height:      Weight:    125.1 kg (275 lb 12.7 oz)  SpO2: 98% 98% 92% 97%    Intake/Output Summary (Last 24 hours) at  11/22/15 4270 Last data filed at 11/22/15 0440  Gross per 24 hour  Intake  630.9 ml  Output    400 ml  Net  230.9 ml   Filed Weights   11/20/15 0500 11/21/15 0302 11/22/15 0433  Weight: 122.6 kg (270 lb 4.5 oz) 125.4 kg (276 lb 7.3 oz) 125.1 kg (275 lb 12.7 oz)    Examination:  General exam: Appears tired and weak, diffuse anasarca  Respiratory  system: Dullness to percussion in the lower lobes  Cardiovascular system: S1 & S2 heard,  Gastrointestinal system: Abdomen is distended, nontender.  Central nervous system: Alert and oriented. No focal neurological deficits.  Data Reviewed: I have personally reviewed following labs and imaging studies  CBC:  Recent Labs Lab 11/18/15 0606 11/19/15 0418 11/20/15 0807 11/21/15 0554 11/22/15 0520  WBC 17.6* 16.4* 16.7* 17.5* 14.8*  HGB 12.2 12.1 12.3 12.5 12.2  HCT 37.3 37.3 38.0 37.5 37.7  MCV 93.0 93.3 93.1 93.1 94.0  PLT 244 229 283 270 623   Basic Metabolic Panel:  Recent Labs Lab 11/18/15 0606 11/19/15 0418 11/20/15 0807 11/21/15 0554 11/22/15 0520  NA 136 136 133* 132* 133*  K 4.1 4.3 5.1 5.2* 5.3*  CL 97* 98* 97* 96* 96*  CO2 21* 23 20* 19* 21*  GLUCOSE 109* 103* 111* 119* 102*  BUN 84* 92* 95* 105* 110*  CREATININE 3.09* 3.44* 3.37* 3.67* 3.47*  CALCIUM 8.5* 8.4* 8.4* 8.4* 8.4*  PHOS  --   --  5.9* 5.9* 6.1*   Liver Function Tests:  Recent Labs Lab 11/16/15 0359 11/20/15 0807 11/21/15 0554 11/22/15 0520  AST 21  --   --   --   ALT 15  --   --   --   ALKPHOS 56  --   --   --   BILITOT 1.0  --   --   --   PROT 5.3*  5.2*  --   --   --   ALBUMIN 2.0* 2.0* 2.1* 2.0*   Recent Labs Lab 11/19/15 0418  AMMONIA 33   Coagulation Profile:  Recent Labs Lab 11/16/15 0359  INR 1.22   Urine analysis:    Component Value Date/Time   COLORURINE YELLOW 11/13/2015 1132   APPEARANCEUR CLOUDY* 11/13/2015 1132   LABSPEC 1.023 11/13/2015 1132   PHURINE 5.5 11/13/2015 1132   GLUCOSEU NEGATIVE 11/13/2015 1132   HGBUR NEGATIVE 11/13/2015 1132   BILIRUBINUR MODERATE* 11/13/2015 1132   KETONESUR NEGATIVE 11/13/2015 1132   PROTEINUR 30* 11/13/2015 1132   NITRITE NEGATIVE 11/13/2015 1132   LEUKOCYTESUR MODERATE* 11/13/2015 1132   Sepsis Labs: _0 (procalcitonin:4,lacticidven:4)  ) Recent Results (from the past 240 hour(s))  Blood culture (routine x  2)     Status: None   Collection Time: 11/12/15  8:20 PM  Result Value Ref Range Status   Specimen Description BLOOD RIGHT ANTECUBITAL  Final   Special Requests IN PEDIATRIC BOTTLE 3CC  Final   Culture NO GROWTH 5 DAYS  Final   Report Status 11/17/2015 FINAL  Final  Blood culture (routine x 2)     Status: None   Collection Time: 11/12/15  8:25 PM  Result Value Ref Range Status   Specimen Description BLOOD RIGHT HAND  Final   Special Requests IN PEDIATRIC BOTTLE 3CC  Final   Culture NO GROWTH 5 DAYS  Final   Report Status 11/17/2015 FINAL  Final  Urine culture     Status: Abnormal   Collection Time: 11/13/15 11:32 AM  Result Value Ref Range Status   Specimen Description URINE, CLEAN CATCH  Final   Special Requests NONE  Final   Culture 60,000 COLONIES/mL YEAST (A)  Final   Report Status 11/14/2015 FINAL  Final  MRSA PCR Screening     Status: None   Collection Time: 11/13/15  5:21 PM  Result Value Ref Range Status   MRSA by PCR NEGATIVE NEGATIVE Final    Comment:        The GeneXpert MRSA Assay (FDA approved for NASAL specimens only), is one component of a comprehensive MRSA colonization surveillance program. It is not intended to diagnose MRSA infection nor to guide or monitor treatment for MRSA infections.   Culture, body fluid-bottle     Status: None   Collection Time: 11/16/15 12:40 PM  Result Value Ref Range Status   Specimen Description FLUID RIGHT PLEURAL  Final   Special Requests BOTTLES DRAWN AEROBIC AND ANAEROBIC 3CC  Final   Culture NO GROWTH 5 DAYS  Final   Report Status 11/21/2015 FINAL  Final  Gram stain     Status: None   Collection Time: 11/16/15 12:40 PM  Result Value Ref Range Status   Specimen Description FLUID RIGHT PLEURAL  Final   Special Requests NONE  Final   Gram Stain   Final    ABUNDANT WBC PRESENT,BOTH PMN AND MONONUCLEAR NO ORGANISMS SEEN    Report Status 11/16/2015 FINAL  Final  Culture, body fluid-bottle     Status: None (Preliminary  result)   Collection Time: 11/19/15 12:44 PM  Result Value Ref Range Status   Specimen Description FLUID PERITONEAL  Final   Special Requests BOTTLES DRAWN AEROBIC AND ANAEROBIC 8CC  Final   Culture NO GROWTH 2 DAYS  Final   Report Status PENDING  Incomplete  Gram stain     Status: None   Collection Time: 11/19/15 12:44 PM  Result Value Ref Range Status   Specimen Description FLUID PERITONEAL  Final   Special Requests NONE  Final   Gram Stain   Final    WBC PRESENT,BOTH PMN AND MONONUCLEAR NO ORGANISMS SEEN CYTOSPIN SMEAR    Report Status 11/19/2015 FINAL  Final      Radiology Studies: Ct Abdomen Pelvis Wo Contrast  11/20/2015  CLINICAL DATA:  Suspected cirrhosis. Ascites. Diffuse abdominal pain. EXAM: CT ABDOMEN AND PELVIS WITHOUT CONTRAST TECHNIQUE: Multidetector CT imaging of the abdomen and pelvis was performed following the standard protocol without IV contrast. COMPARISON:  11/14/2015 abdominal ultrasound. Chest CT of 11/12/2015. FINDINGS: Lower chest: Bibasilar airspace disease, worse on the left. Mild cardiomegaly. Trace left and moderate right pleural effusions. Hepatobiliary: Multifactorial degradation, including lack of IV contrast and patient body habitus. Old granulomatous disease in the liver. Suspicion of cirrhosis on prior ultrasound. Less apparent today onCT. Capsular based low-density foci are likely subcapsular fluid collections. Example straddling the right and left lobe of the liver at 8.1 x 9.5 cm on image 25/series 2. Cholecystectomy, without biliary duct dilatation. Pancreas: Marked pancreatic atrophy without dominant mass or acute inflammation Spleen: Normal in size, without focal abnormality. Adrenals/Urinary Tract: Normal adrenal glands. Mild renal cortical thinning bilaterally. A too small to characterize upper pole left renal lesion. No hydronephrosis. Foley catheter within a decompressed urinary bladder. Stomach/Bowel: Normal stomach, without wall thickening.  Scattered colonic diverticula. Normal small bowel caliber. Vascular/Lymphatic: Aortic and branch vessel atherosclerosis. No abdominopelvic adenopathy. Reproductive: Normal uterus. Right adnexal/ovarian low-density lesion is likely a residual follicle at 2.0 cm. Other: Moderate volume abdominal  ascites. The abdominal ascites is loculated, primarily positioned anteriorly (without significant fluid in the paracolic gutters). There is also adjacent omental thickening, including on image 56/series 2. Fluid identified within the lesser sac on image 35/series 2. Anasarca. Musculoskeletal: Advanced lumbosacral spondylosis. IMPRESSION: 1. Multifactorial degradation, including patient body habitus and lack of IV contrast. 2. Moderate volume ascites, primarily loculated anteriorly. Concurrent omental edema/thickening. Probable cirrhosis. Although ascites could be secondary to portal venous hypertension, given the omental edema, omental/peritoneal metastasis cannot be excluded. Correlate with primary malignancy history and the result of recent paracentesis. 3. Capsular based low-density foci within the liver are likely due to subcapsular fluid collections. Capsular based cysts felt less likely. 4. No definite primary malignancy identified within the abdomen or pelvis. 5. Right larger than left pleural effusions with right base atelectasis and left base infection versus atelectasis. 6. Pancreatic atrophy. Electronically Signed   By: Abigail Miyamoto M.D.   On: 11/20/2015 09:02      Scheduled Meds: . furosemide  80 mg Intravenous Q12H  . magic mouthwash  5 mL Oral TID  . metoprolol tartrate  12.5 mg Oral BID  . midodrine  10 mg Oral TID WC  . mometasone-formoterol  2 puff Inhalation BID  . octreotide  100 mcg Subcutaneous Q12H  . ondansetron (ZOFRAN) IV  4 mg Intravenous Once  . sertraline  25 mg Oral Daily  . sodium bicarbonate  650 mg Oral BID  . sodium chloride flush  3 mL Intravenous Q12H  . sodium chloride flush   3 mL Intravenous Q12H   Continuous Infusions: . heparin 1,050 Units/hr (11/22/15 0449)     LOS: 10 days   Time spent: 20 minutes   Faye Ramsay, MD [facility] Pager [phone]  If 7PM-7AM, please contact night-coverage [email or web address] Password [facility] 11/22/2015, 6:37 AM

## 2015-11-22 NOTE — Progress Notes (Signed)
Patient ID: Kendra Martinez, female   DOB: August 15, 1933, 82 y.o.   MRN: [id number] S: Stable. Surrounded by family. No new complaints.  O:BP 104/72 mmHg  Pulse 75  Temp(Src) 98.2 F (36.8 C) (Axillary)  Resp 16  Ht _0  (1.6 m)  Wt 275 lb 12.7 oz (125.1 kg)  BMI 48.87 kg/m2  SpO2 100%  Intake/Output Summary (Last 24 hours) at 11/22/15 1150 Last data filed at 11/22/15 0440  Gross per 24 hour  Intake  510.9 ml  Output    400 ml  Net  110.9 ml   Intake/Output: I/O last 3 completed shifts: In: 630.9 [P.O.:360; I.V.:270.9] Out: 650 [Urine:650]  Intake/Output this shift:    Weight change: -10.6 oz (-0.3 kg) Gen: frail elderly, pale WF in NAD CVS: no rub Resp: decreased BS at bases Abd: distended, obese, nontender Ext: 1-2+ edema   Recent Labs Lab 11/16/15 0359 11/17/15 0218 11/18/15 0606 11/19/15 0418 11/20/15 0807 11/21/15 0554 11/22/15 0520  NA 137 133* 136 136 133* 132* 133*  K 4.1 4.3 4.1 4.3 5.1 5.2* 5.3*  CL 99* 99* 97* 98* 97* 96* 96*  CO2 22 17* 21* 23 20* 19* 21*  GLUCOSE 103* 108* 109* 103* 111* 119* 102*  BUN 70* 76* 84* 92* 95* 105* 110*  CREATININE 2.87* 2.84* 3.09* 3.44* 3.37* 3.67* 3.47*  ALBUMIN 2.0*  --   --   --  2.0* 2.1* 2.0*  CALCIUM 8.2* 8.2* 8.5* 8.4* 8.4* 8.4* 8.4*  PHOS  --   --   --   --  5.9* 5.9* 6.1*  AST 21  --   --   --   --   --   --   ALT 15  --   --   --   --   --   --    Liver Function Tests:  Recent Labs Lab 11/16/15 0359 11/20/15 0807 11/21/15 0554 11/22/15 0520  AST 21  --   --   --   ALT 15  --   --   --   ALKPHOS 56  --   --   --   BILITOT 1.0  --   --   --   PROT 5.3*  5.2*  --   --   --   ALBUMIN 2.0* 2.0* 2.1* 2.0*   No results for input(s): LIPASE, AMYLASE in the last 168 hours.  Recent Labs Lab 11/19/15 0418  AMMONIA 33   CBC:  Recent Labs Lab 11/18/15 0606 11/19/15 0418 11/20/15 0807 11/21/15 0554 11/22/15 0520  WBC 17.6* 16.4* 16.7* 17.5* 14.8*  HGB 12.2 12.1 12.3 12.5 12.2  HCT 37.3 37.3 38.0  37.5 37.7  MCV 93.0 93.3 93.1 93.1 94.0  PLT 244 229 283 270 308   Cardiac Enzymes: No results for input(s): CKTOTAL, CKMB, CKMBINDEX, TROPONINI in the last 168 hours. CBG: No results for input(s): GLUCAP in the last 168 hours.  Iron Studies: No results for input(s): IRON, TIBC, TRANSFERRIN, FERRITIN in the last 72 hours. Studies/Results: No results found. . furosemide  80 mg Intravenous Q12H  . magic mouthwash  5 mL Oral TID  . metoprolol tartrate  12.5 mg Oral BID  . midodrine  10 mg Oral TID WC  . mometasone-formoterol  2 puff Inhalation BID  . octreotide  100 mcg Subcutaneous Q12H  . ondansetron (ZOFRAN) IV  4 mg Intravenous Once  . sertraline  25 mg Oral Daily  . sodium bicarbonate  650 mg Oral BID  .  sodium chloride flush  3 mL Intravenous Q12H  . sodium chloride flush  3 mL Intravenous Q12H    BMET    Component Value Date/Time   NA 133* 11/22/2015 0520   K 5.3* 11/22/2015 0520   CL 96* 11/22/2015 0520   CO2 21* 11/22/2015 0520   GLUCOSE 102* 11/22/2015 0520   BUN 110* 11/22/2015 0520   CREATININE 3.47* 11/22/2015 0520   CALCIUM 8.4* 11/22/2015 0520   GFRNONAA 12* 11/22/2015 0520   GFRAA 13* 11/22/2015 0520   CBC    Component Value Date/Time   WBC 14.8* 11/22/2015 0520   RBC 4.01 11/22/2015 0520   HGB 12.2 11/22/2015 0520   HCT 37.7 11/22/2015 0520   PLT 308 11/22/2015 0520   MCV 94.0 11/22/2015 0520   MCH 30.4 11/22/2015 0520   MCHC 32.4 11/22/2015 0520   RDW 15.8* 11/22/2015 0520   LYMPHSABS 1.0 11/12/2015 1619   MONOABS 1.3* 11/12/2015 1619   EOSABS 0.0 11/12/2015 1619   BASOSABS 0.0 11/12/2015 1619   Assessment/Plan:  1. ARF, oliguric in setting of anasarca and likely cirrhosis (as seen on Abdominal US).  FeNa is low consistent with hepatorenal syndrome, no proteinuria, bland urine sediment, normal ESR. Hepatorenal syndrome, therefore dialysis is not indicated as she is not a liver transplant candidate and therefore will not offer HD.  Continue with  current level of care unless deescalating care. UOP 400cc in 24hr. Cr. Improved to 3.47, but BUN up to 110. K up slightly to 5.3. 1. Discussed with family and all are in agreement with no dialysis 2. Started octreotide and midodrine for probable hepatorenal syndrome and improvement of renal perfusion 3. Continue Lasix: 53m BID 4. Phos 6.1 >> holding on any binders for now as this may cause more discomfort. 2. Hyponatremia- related to Anasarca and ARF. 133 today 3. Anasarca-  GI following. CA125 elevated to 626.6. Oncology on board.  1. After discussion w/ family it looks like they are wanting a liver bx. 2. Unsure if aggressive therapy would be appropriate as patient's kidney and liver function are failing. 4. CHF- ECHO with EF 45-50%.  5. Disposition- poor overall prognosis.  She is not a dialysis candidate.   IElberta Leatherwood MD,MS,  PGY2 11/22/2015 11:50 AM   I have seen and examined this patient and agree with plan as outlined by Dr. MAlease Frame  Continue with conservative measures for now.  Real question is whether she would be a candidate for chemo, however if her renal function continues to deteriorate I would not put her through biopsy just for sake of making diagnosis as her prognosis is grim regardless of source of malignancy.  Also question her ability to take chemo with liver and kidney failure.  I recommend home with hospice. JGovernor RooksColadonato,MD 11/22/2015 12:18 PM

## 2015-11-22 NOTE — Care Management Important Message (Signed)
Important Message  Patient Details  Name: Kendra Martinez MRN: [id number] Date of Birth: 1933/09/15   Medicare Important Message Given:  Yes    Shiryl Ruddy Abena 11/22/2015, 1:50 PM

## 2015-11-22 NOTE — Progress Notes (Addendum)
SUBJECTIVE:  No complaints of SOB.  Still wanting omental bx for definitive diagnosis  OBJECTIVE:   Vitals:   Filed Vitals:   11/21/15 1945 11/22/15 0138 11/22/15 0433 11/22/15 0900  BP: 101/56 114/80 98/79 104/72  Pulse: 75 105 75 75  Temp: 98.6 F (37 C) 98.6 F (37 C) 98.4 F (36.9 C) 98.2 F (36.8 C)  TempSrc: Axillary Oral Oral Axillary  Resp: 18 18 18 16   Height:      Weight:   275 lb 12.7 oz (125.1 kg)   SpO2: 98% 92% 97% 100%   I&O's:   Intake/Output Summary (Last 24 hours) at 11/22/15 1055 Last data filed at 11/22/15 0440  Gross per 24 hour  Intake  510.9 ml  Output    400 ml  Net  110.9 ml   TELEMETRY: Reviewed telemetry pt in atrial fibrilation:     PHYSICAL EXAM General: Well developed, well nourished, in no acute distress Head: Eyes PERRLA, No xanthod bruit. No JVD.  No abdominal bruits. No femoral bruits.mas.   Normal cephalic and atramatic  Lungs:   Clear bilaterally to auscultation anteriorly Heart:   HRRR S1 S2 Pulses are 2+ & equal. Extremities:   No clubbing, cyanosis or edema.  DP +1 Neuro: Alert and oriented X 3. Psych:  Good affect, responds appropriately   LABS: Basic Metabolic Panel:  Recent Labs  11/21/15 0554 11/22/15 0520  NA 132* 133*  K 5.2* 5.3*  CL 96* 96*  CO2 19* 21*  GLUCOSE 119* 102*  BUN 105* 110*  CREATININE 3.67* 3.47*  CALCIUM 8.4* 8.4*  PHOS 5.9* 6.1*   Liver Function Tests:  Recent Labs  11/21/15 0554 11/22/15 0520  ALBUMIN 2.1* 2.0*   No results for input(s): LIPASE, AMYLASE in the last 72 hours. CBC:  Recent Labs  11/21/15 0554 11/22/15 0520  WBC 17.5* 14.8*  HGB 12.5 12.2  HCT 37.5 37.7  MCV 93.1 94.0  PLT 270 308   Cardiac Enzymes: No results for input(s): CKTOTAL, CKMB, CKMBINDEX, TROPONINI in the last 72 hours. BNP: Invalid input(s): POCBNP D-Dimer: No results for input(s): DDIMER in the last 72 hours. Hemoglobin A1C: No results for input(s): HGBA1C in the last 72 hours. Fasting  Lipid Panel: No results for input(s): CHOL, HDL, LDLCALC, TRIG, CHOLHDL, LDLDIRECT in the last 72 hours. Thyroid Function Tests: No results for input(s): TSH, T4TOTAL, T3FREE, THYROIDAB in the last 72 hours.  Invalid input(s): FREET3 Anemia Panel: No results for input(s): VITAMINB12, FOLATE, FERRITIN, TIBC, IRON, RETICCTPCT in the last 72 hours. Coag Panel:   Lab Results  Component Value Date   INR 1.22 11/16/2015   INR 1.59* 11/13/2015    RADIOLOGY: Ct Abdomen Pelvis Wo Contrast  11/20/2015  CLINICAL DATA:  Suspected cirrhosis. Ascites. Diffuse abdominal pain. EXAM: CT ABDOMEN AND PELVIS WITHOUT CONTRAST TECHNIQUE: Multidetector CT imaging of the abdomen and pelvis was performed following the standard protocol without IV contrast. COMPARISON:  11/14/2015 abdominal ultrasound. Chest CT of 11/12/2015. FINDINGS: Lower chest: Bibasilar airspace disease, worse on the left. Mild cardiomegaly. Trace left and moderate right pleural effusions. Hepatobiliary: Multifactorial degradation, including lack of IV contrast and patient body habitus. Old granulomatous disease in the liver. Suspicion of cirrhosis on prior ultrasound. Less apparent today onCT. Capsular based low-density foci are likely subcapsular fluid collections. Example straddling the right and left lobe of the liver at 8.1 x 9.5 cm on image 25/series 2. Cholecystectomy, without biliary duct dilatation. Pancreas: Marked pancreatic atrophy without dominant mass or  acute inflammation Spleen: Normal in size, without focal abnormality. Adrenals/Urinary Tract: Normal adrenal glands. Mild renal cortical thinning bilaterally. A too small to characterize upper pole left renal lesion. No hydronephrosis. Foley catheter within a decompressed urinary bladder. Stomach/Bowel: Normal stomach, without wall thickening. Scattered colonic diverticula. Normal small bowel caliber. Vascular/Lymphatic: Aortic and branch vessel atherosclerosis. No abdominopelvic  adenopathy. Reproductive: Normal uterus. Right adnexal/ovarian low-density lesion is likely a residual follicle at 2.0 cm. Other: Moderate volume abdominal ascites. The abdominal ascites is loculated, primarily positioned anteriorly (without significant fluid in the paracolic gutters). There is also adjacent omental thickening, including on image 56/series 2. Fluid identified within the lesser sac on image 35/series 2. Anasarca. Musculoskeletal: Advanced lumbosacral spondylosis. IMPRESSION: 1. Multifactorial degradation, including patient body habitus and lack of IV contrast. 2. Moderate volume ascites, primarily loculated anteriorly. Concurrent omental edema/thickening. Probable cirrhosis. Although ascites could be secondary to portal venous hypertension, given the omental edema, omental/peritoneal metastasis cannot be excluded. Correlate with primary malignancy history and the result of recent paracentesis. 3. Capsular based low-density foci within the liver are likely due to subcapsular fluid collections. Capsular based cysts felt less likely. 4. No definite primary malignancy identified within the abdomen or pelvis. 5. Right larger than left pleural effusions with right base atelectasis and left base infection versus atelectasis. 6. Pancreatic atrophy. Electronically Signed   By: Abigail Miyamoto M.D.   On: 11/20/2015 09:02   Dg Chest 2 View  11/14/2015  CLINICAL DATA:  Acute respiratory failure with hypoxemia. EXAM: CHEST  2 VIEW COMPARISON:  November 12, 2015. FINDINGS: Stable cardiomediastinal silhouette. No pneumothorax is noted. Left lung is clear. Increased right midlung opacity is noted concerning for subsegmental atelectasis mild right pleural effusion remains. Stable presence of calcified loose body in right shoulder area. IMPRESSION: Increased right midlung opacity is noted most consistent with subsegmental atelectasis. Mild right pleural effusion is noted as well. Electronically Signed   By: Marijo Conception, M.D.   On: 11/14/2015 07:40   Dg Chest 2 View  11/12/2015  CLINICAL DATA:  Shortness of breath for 2 weeks. Recent diagnosis of pneumonia. EXAM: CHEST  2 VIEW COMPARISON:  None. FINDINGS: Heart size and pulmonary vascularity are normal. There is calcification in the arch of the aorta. There is a moderate right pleural effusion. Left lung is clear. No acute bone abnormality. Degenerative changes of both shoulders with a large calcified loose body in the right shoulder joint in the subcoracoid recess. IMPRESSION: Moderate right pleural effusion.  Aortic atherosclerosis. Electronically Signed   By: Lorriane Shire M.D.   On: 11/12/2015 16:09   Dg Abd 1 View  11/13/2015  CLINICAL DATA:  Abdominal distention EXAM: ABDOMEN - 1 VIEW COMPARISON:  None. FINDINGS: Mild gastric distention. Nonobstructive bowel gas pattern. Cholecystectomy clips. Degenerative changes of the visualized thoracolumbar spine. IMPRESSION: Unremarkable abdominal radiograph. Electronically Signed   By: Julian Hy M.D.   On: 11/13/2015 00:04   Ct Chest Wo Contrast  11/12/2015  CLINICAL DATA:  Shortness of breath, 2 weeks duration. Treated for pneumonia. Pleural effusion. EXAM: CT CHEST WITHOUT CONTRAST TECHNIQUE: Multidetector CT imaging of the chest was performed following the standard protocol without IV contrast. COMPARISON:  Radiography same day FINDINGS: There is a moderate to large pleural effusion on the right layering dependently. There is dependent atelectasis in the right lung. The aerated portion of the right lung is clear except for minimal atelectasis or scarring in the middle lobe. No pleural effusion on the left. There  is minimal atelectasis at the left base. The left lung is otherwise clear. No underlying emphysema. No mediastinal or hilar mass or lymphadenopathy. There is atherosclerosis of the aorta and extensive coronary artery calcification. Scans in the upper abdomen show a large amount of ascites. There are at  least 2 large liver cysts, not completely evaluated. The larger cyst at the junction of the right and left lobes measures up to 11 cm in diameter. Right lobe cyst partially imaged measures up to 7 cm in diameter. IMPRESSION: Right pleural effusion layering dependently with dependent pulmonary atelectasis. The aerated lung does not show evidence of pre-existing lung disease or active pneumonia. Atherosclerosis of the aorta and the coronary arteries. Large amount of ascites. Two large liver cysts. The abdomen was not completely evaluated. Electronically Signed   By: Nelson Chimes M.D.   On: 11/12/2015 18:30   US Abdomen Complete  11/14/2015  CLINICAL DATA:  Ascites. Evaluate liver. Acute renal failure. History of hypertension and congestive heart failure. History of a cholecystectomy. EXAM: ABDOMEN ULTRASOUND COMPLETE COMPARISON:  11/13/2015 FINDINGS: Gallbladder: Surgically absent. Common bile duct: Diameter: 6 mm Liver: 2 large cystic areas, 1 projecting along the anterior segment right lobe/medial segmental left lobe measuring 8.9 x 7.8 x 10.3 cm. The other arising from the posterior aspect of the right lobe measuring 6.6 x 6.1 x 7.3 cm. Liver demonstrates a coarsened echotexture with a nodular surface contour. Liver normal in overall size. IVC: Limited visualization.  No gross abnormality. Pancreas: Limited visualization.  No gross abnormality. Spleen: Size and appearance within normal limits. Right Kidney: Length: 10.6 cm. Mild renal cortical thinning. Echogenicity within normal limits. No mass or hydronephrosis visualized. Left Kidney: Length: 10.9 cm. Mild renal cortical thinning. Echogenicity within normal limits. No mass or hydronephrosis visualized. Abdominal aorta: Not well seen from the midportion to the bifurcation. Gross aneurysm. Other findings: Moderate amount of ascites similar to the previous day's study. Right pleural effusion. IMPRESSION: 1. Appearance of the liver is consistent with cirrhosis.  There are 2 large with her cystic masses, likely simple hepatic cysts. Consider follow-up liver MRI with and without contrast for further assessment and lesion characterization. 2. No acute findings. 3. Moderate ascites. Electronically Signed   By: Lajean Manes M.D.   On: 11/14/2015 15:43   US Paracentesis  11/19/2015  INDICATION: Suspected cirrhosis. Chronic diastolic heart failure. Fluid overload. Ascites. Request for diagnostic paracentesis with 250 ml maximum. EXAM: ULTRASOUND GUIDED LEFT ANTERIOR ABDOMEN PARACENTESIS MEDICATIONS: 1% Lidocaine. COMPLICATIONS: None immediate. PROCEDURE: Informed written consent was obtained from the patient after a discussion of the risks, benefits and alternatives to treatment. A timeout was performed prior to the initiation of the procedure. Initial ultrasound scanning demonstrates a large amount of ascites within the right lower abdominal quadrant. The right lower abdomen was prepped and draped in the usual sterile fashion. 1% lidocaine with epinephrine was used for local anesthesia. Following this, a 19 gauge, 7-cm, Yueh catheter was introduced. An ultrasound image was saved for documentation purposes. The paracentesis was performed. The catheter was removed and a dressing was applied. The patient tolerated the procedure well without immediate post procedural complication. FINDINGS: A total of approximately 250 ml of clear yellow fluid was removed. Samples were sent to the laboratory as requested by the clinical team. IMPRESSION: Successful ultrasound-guided paracentesis yielding 250 ml of peritoneal fluid. Read by:  Gareth Eagle, PA-C Electronically Signed   By: Markus Daft M.D.   On: 11/19/2015 14:42   Dg Chest [facility]  1 View  11/16/2015  CLINICAL DATA:  Status post right thoracentesis. EXAM: PORTABLE CHEST 1 VIEW COMPARISON:  11/14/2015 FINDINGS: Normal heart size. There is scratch set there has been interval decrease in volume of the right pleural effusion. No  pneumothorax identified. Asymmetric elevation of left hemidiaphragm is again noted. IMPRESSION: 1. Decrease in volume of right pleural effusion following thoracentesis. No pneumothorax. Electronically Signed   By: Kerby Moors M.D.   On: 11/16/2015 14:09   Dg Abd Portable 2v  11/18/2015  CLINICAL DATA:  Abdominal pain EXAM: PORTABLE ABDOMEN - 2 VIEW COMPARISON:  11/12/2015 abdominal radiograph FINDINGS: Moderate gaseous distention of the stomach. No dilated small bowel loops or air-fluid levels. Mild stool throughout the colon. No evidence of pneumatosis or pneumoperitoneum. Marked degenerative changes in the visualized thoracolumbar spine. Cholecystectomy clips are seen in the right upper quadrant of the abdomen. IMPRESSION: 1. Nonspecific moderate gaseous distention of the stomach . 2. No evidence of small or large bowel obstruction. Electronically Signed   By: Ilona Sorrel M.D.   On: 11/18/2015 14:12   US Abdomen Limited Ruq  11/13/2015  CLINICAL DATA:  Abdominal distention for 1 month EXAM: LIMITED ABDOMEN ULTRASOUND FOR ASCITES TECHNIQUE: Limited ultrasound survey for ascites was performed in all four abdominal quadrants. COMPARISON:  None. FINDINGS: There is moderate generalized ascites. There are loops of surrounding peristalsing bowel. IMPRESSION: Moderate generalized ascites. Electronically Signed   By: Lowella Grip III M.D.   On: 11/13/2015 09:22    ASSESSMENT AND PLAN  1. Massive fluid overload secondary to hypoalbuminemia and probable malignancy as malignant cells found in pleural fluid.  Urinary output low. Cr now up to 3.47.  No dialysis candidate per nephrology and they added Lasix back more for comfort. She had thoracentesis with malignant cells in the fluid.  Oncology on board. Palliative care following.   Her prognosis is very poor.  2. Newly diagnosed atrial fibrillation - CHA2DS2-Vasc score 3 (HF, female, HTN) - on IV heparin. No current plan for  systemic anticoagulation  given her very poor prognosis.   - continue on metoprolol for rate control.    3. Recurrent R pleural effusion: s/p bedside thoracentesis 4/21, removed 500cc- malignant cells noted.  4. Moderate ascites: U/S of abdomen shows cirrhosis but GI feels that ascites not from cirrhosis and most likely secondary to malignancy.    GI signed off.   5. Recent Klebsiella pneumonia UTI treated at [facility]  6.  Elevated troponin in setting of malignant pleural effusions and afib.  Given poor prognosis no further workup is indicated at this time.  No new recs at this time from cardiac standpoint.  Prognosis is poor.  Will sign off. Call with any questions.   I have spent 35 minutes talking with patient, husband and son regarding her care.  They have a lot of questions regarding her prognosis and getting a definitive diagnosis of CA and what impact that will have on her therapy and if she is strong enough to endure treatment.  > 50% of time was spent in direct patient care.  Sueanne Margarita, MD  11/22/2015  10:55 AM

## 2015-11-22 NOTE — Progress Notes (Signed)
ANTICOAGULATION CONSULT NOTE - Follow Up Consult  Pharmacy Consult:  Heparin Indication: atrial fibrillation  Allergies  Allergen Reactions  . Sulfa Antibiotics Hives    Patient Measurements: Height: 5\' 3"  (160 cm) Weight: 275 lb 12.7 oz (125.1 kg) IBW/kg (Calculated) : 52.4 Heparin Dosing Weight: 82 kg  Vital Signs: Temp: 98.2 F (36.8 C) (04/27 0900) Temp Source: Axillary (04/27 0900) BP: 104/72 mmHg (04/27 0900) Pulse Rate: 75 (04/27 0900)  Labs:  Recent Labs  11/20/15 0807 11/21/15 0554 11/22/15 0520  HGB 12.3 12.5 12.2  HCT 38.0 37.5 37.7  PLT 283 270 308  HEPARINUNFRC 0.47 0.40 0.37  CREATININE 3.37* 3.67* 3.47*    Estimated Creatinine Clearance: 16.9 mL/min (by C-G formula based on Cr of 3.47).   Assessment: 70 YOF with new-onset AFib.  HL therapeutic.  Goal of Therapy:  Heparin level 0.3-0.7 units/ml Monitor platelets by anticoagulation protocol: Yes    Plan:  - Continue heparin infusion at 1050 units/hr - Daily HL, CBC - Monitor s/s of bleeding  Thank you Anette Guarneri, PharmD [phone] 11/22/2015 11:34 AM

## 2015-11-22 NOTE — Progress Notes (Signed)
PT Cancellation Note  Patient Details Name: Desiray Kruizenga MRN: [id number] DOB: 09-20-1933   Cancelled Treatment:    Reason Eval/Treat Not Completed: Fatigue/lethargy limiting ability to participate, attempted to see this am, spoke with nursing, MD was in room. Patient and spouse having difficult discussion with MD and per nsg patient with nausea today and very fatigued, will hold session this date and follow up as appropiate.   Duncan Dull 11/22/2015, 1:49 PM Alben Deeds, [place] DPT  [phone]

## 2015-11-22 NOTE — Progress Notes (Signed)
Daily Progress Note   Patient Name: Kendra Martinez       Date: 11/22/2015 DOB: 05/10/1934  Age: 82 y.o. MRN#: [id number] Attending Physician: Theodis Blaze, MD Primary Care Physician: No PCP Per Patient Admit Date: 11/12/2015  Reason for Consultation/Follow-up: Establishing goals of care and Psychosocial/spiritual support  Subjective:  -scheduled discussion with Dr Fong/oncology vis telephone, this NP present to facilitate communication and understanding of information  -patient and family/husband now has a clear understanding of the limitations of medical interventions available to prolong quality life.  They now desire that comfort and dignity be the focus of care.  Hope is for a hospice facility for EOL  -PMT will continue to support holistically   Length of Stay: 10 days  Current Medications: Scheduled Meds:  . furosemide  80 mg Intravenous Q12H  . magic mouthwash  5 mL Oral TID  . metoprolol tartrate  12.5 mg Oral BID  . midodrine  10 mg Oral TID WC  . mometasone-formoterol  2 puff Inhalation BID  . octreotide  100 mcg Subcutaneous Q12H  . ondansetron (ZOFRAN) IV  4 mg Intravenous Once  . sertraline  25 mg Oral Daily  . sodium bicarbonate  650 mg Oral BID  . sodium chloride flush  3 mL Intravenous Q12H  . sodium chloride flush  3 mL Intravenous Q12H    Continuous Infusions: . heparin 1,050 Units/hr (11/22/15 0449)    PRN Meds: sodium chloride, acetaminophen **OR** acetaminophen, ALPRAZolam, HYDROcodone-acetaminophen, ondansetron **OR** ondansetron (ZOFRAN) IV, prochlorperazine, sodium chloride flush, traMADol  Physical Exam  Constitutional: She appears ill.  -generalized weakness  Cardiovascular: Normal rate and regular rhythm.   Pulmonary/Chest: She has decreased breath  sounds.  Abdominal: Soft.  Neurological: She is alert.  Skin: Skin is warm and dry.                Vital Signs: BP 104/72 mmHg  Pulse 75  Temp(Src) 98.2 F (36.8 C) (Axillary)  Resp 16  Ht 5' 3"  (1.6 m)  Wt 125.1 kg (275 lb 12.7 oz)  BMI 48.87 kg/m2  SpO2 100% SpO2: SpO2: 100 % O2 Device: O2 Device: Nasal Cannula O2 Flow Rate: O2 Flow Rate (L/min): 1.5 L/min  Intake/output summary:   Intake/Output Summary (Last 24 hours) at 11/22/15 1412 Last data filed at 11/22/15 0440  Gross per 24 hour  Intake  390.9 ml  Output    400 ml  Net   -9.1 ml   LBM: Last BM Date: 11/22/15 Baseline Weight: Weight: 117.028 kg (258 lb) Most recent weight: Weight: 125.1 kg (275 lb 12.7 oz)       Palliative Assessment/Data: 20%      Patient Active Problem List   Diagnosis Date Noted  . Palliative care encounter 11/20/2015  . DNR (do not resuscitate) 11/20/2015  . Parapneumonic effusion   . Acute encephalopathy   . Liver lesion   . Arterial hypotension   . ARF (acute renal failure) ([place])   . Cirrhosis of liver with ascites ([place])   . Elevated troponin 11/13/2015  . Acute respiratory failure with hypoxemia ([place])   . Ascites   . Acute on chronic diastolic CHF (congestive heart failure), NYHA class 4 ([place])   . Pleural effusion 11/12/2015  . Essential hypertension 11/12/2015  . Hyponatremia 11/12/2015  . Sepsis ([place]) 11/12/2015  . Anasarca 11/12/2015  . New onset a-fib ([place]) 11/12/2015  . Abdominal distention   . Hypoxia   . Increased anion gap metabolic acidosis   . Lactic acidosis   . Acute respiratory failure with hypoxia [facility])     Palliative Care Assessment & Plan    Assessment:    Acute on chronic kidney disease/Hepatorenal syndrome/hyperkalemia  - oliguric in setting of anasarca and likely cirrhosis (as seen on Abdominal US). - FeNa is low consistent with hepatorenal syndrome, no proteinuria, bland urine sediment, normal ESR - pt determined not to be HD candidate -  lasix added per nephrology, more for comfort, but overall prognosis guarded with recommendation to focus on comfort   Liver cirrhosis of unclear etiology/hepatic cysts - Liver cysts also noted. - per GI team, patient's ascitic fluid does not support cirrhosis (SAG << 1.1) as the primary cause for her ascites, evidence for hepatic cirrhosis is weak and is limited to the suggestion of cirrhosis on one ultrasound (but not others) in the presence of ascites. - GI recommended focusing on areas other than the liver regarding her anasarca and ascites. No further recommendations. GI signed off 4/25 - d/w with IR, can not do anything about liver cysts but can do omental biopsy, to d/w with pt and family after PCT sees   Continued decline, shift to full comfort   Code Status:  DNR    Code Status Orders        Start     Ordered   11/19/15 1157  Do not attempt resuscitation (DNR)   Continuous    Question Answer Comment  Maintain current active treatments Yes   Do not initiate new interventions No      11/19/15 1156    Code Status History    Date Active Date Inactive Code Status Order ID Comments User Context   11/13/2015  3:36 AM 11/19/2015 11:56 AM Full Code [id number]  Toy Baker, MD ED    Advance Directive Documentation        Most Recent Value   Type of Advance Directive  Living will   Pre-existing out of facility DNR order (yellow form or pink MOST form)     "MOST" Form in Place?         Prognosis:  Overall prognosis is poor, likely days to weeks   Thank you for allowing the Palliative Medicine Team to assist in the care of this patient.   Time In:  1200 Time Out: 1245 Total  Time 45 min Prolonged Time Billed  no       Greater than 50%  of this time was spent counseling and coordinating care related to the above assessment and plan.  Wadie Lessen, NP  Please contact Palliative Medicine Team phone at [phone] for questions and concerns.   Discussed with Dr  Doyle Askew

## 2015-11-23 LAB — RENAL FUNCTION PANEL
ALBUMIN: 2 g/dL — AB (ref 3.5–5.0)
Anion gap: 15 (ref 5–15)
BUN: 114 mg/dL — AB (ref 6–20)
CALCIUM: 8.6 mg/dL — AB (ref 8.9–10.3)
CO2: 23 mmol/L (ref 22–32)
CREATININE: 3.39 mg/dL — AB (ref 0.44–1.00)
Chloride: 96 mmol/L — ABNORMAL LOW (ref 101–111)
GFR calc Af Amer: 14 mL/min — ABNORMAL LOW (ref >60)
GFR calc non Af Amer: 12 mL/min — ABNORMAL LOW (ref >60)
GLUCOSE: 105 mg/dL — AB (ref 65–99)
Phosphorus: 6.4 mg/dL — ABNORMAL HIGH (ref 2.5–4.6)
Potassium: 5.6 mmol/L — ABNORMAL HIGH (ref 3.5–5.1)
SODIUM: 134 mmol/L — AB (ref 135–145)

## 2015-11-23 LAB — CBC
HCT: 38 % (ref 36.0–46.0)
Hemoglobin: 12.3 g/dL (ref 12.0–15.0)
MCH: 30.6 pg (ref 26.0–34.0)
MCHC: 32.4 g/dL (ref 30.0–36.0)
MCV: 94.5 fL (ref 78.0–100.0)
PLATELETS: 229 10*3/uL (ref 150–400)
RBC: 4.02 MIL/uL (ref 3.87–5.11)
RDW: 15.8 % — AB (ref 11.5–15.5)
WBC: 13.4 10*3/uL — ABNORMAL HIGH (ref 4.0–10.5)

## 2015-11-23 MED ORDER — PROMETHAZINE HCL 25 MG PO TABS: 25.0000 mg | ORAL_TABLET | Freq: Four times a day (QID) | ORAL | Status: AC | PRN

## 2015-11-23 MED ORDER — MORPHINE SULFATE (CONCENTRATE) 10 MG/0.5ML PO SOLN: 10.0000 mg | Freq: Four times a day (QID) | ORAL | Status: AC | PRN

## 2015-11-23 MED ORDER — ALPRAZOLAM 0.25 MG PO TABS: 0.2500 mg | ORAL_TABLET | Freq: Three times a day (TID) | ORAL | Status: AC | PRN

## 2015-11-23 MED ORDER — FUROSEMIDE 80 MG PO TABS: 80.0000 mg | ORAL_TABLET | Freq: Two times a day (BID) | ORAL | Status: AC

## 2015-11-23 MED ORDER — OXYCODONE-ACETAMINOPHEN 5-325 MG PO TABS: 1.0000 | ORAL_TABLET | ORAL | Status: AC | PRN

## 2015-11-23 MED ORDER — ONDANSETRON HCL 4 MG PO TABS: 4.0000 mg | ORAL_TABLET | Freq: Four times a day (QID) | ORAL | Status: AC | PRN

## 2015-11-23 NOTE — Progress Notes (Signed)
PT Discharge Note  Patient Details Name: Kendra Martinez MRN: [id number] DOB: [birth year]   Cancelled Treatment:    Reason Eval/Treat Not Completed: Other (comment)  Patient is being discharged from PT services secondary to:  Medical decline- will need to re-order to resume therapy services.    Please see latest Therapy Progress Note for current level of functioning and progress toward goals.   Progress and discharge plan and discussed with patient/caregiver and they  Agree (discussed with son)   Neve Branscomb 11/23/2015, 9:03 AM Pager [phone]

## 2015-11-23 NOTE — Care Management Important Message (Signed)
Important Message  Patient Details  Name: Kendra Martinez MRN: [id number] Date of Birth: [birth year]   Medicare Important Message Given:  Yes    Bethena Roys, RN 11/23/2015, 11:27 AM

## 2015-11-23 NOTE — Discharge Summary (Signed)
Physician Discharge Summary  Avelynn Sellin QPY:[id number] DOB: 1934-02-05 DOA: 11/12/2015  PCP: No PCP Per Patient  Admit date: 11/12/2015 Discharge date: 11/23/2015  Recommendations for Outpatient Follow-up:  1. Pt will be discharged to Residential hospice   Discharge Diagnoses:  Principal Problem:   Acute on chronic diastolic CHF (congestive heart failure), NYHA class 4 (HCC) Active Problems:   Hyponatremia   Sepsis ([place])   Anasarca  Discharge Condition: Stable  Diet recommendation: Comfort feeding as pt able to tolerate   Brief Narrative:  82 year old Caucasian female with hypertension, diastolic CHF, recently treated pneumonia at an outside facility, presented with shortness of breath. Patient is new to [place]. She has recently relocated to this area. Does not see any providers here. Patient was seen by multiple specialists while hospitalized including cardiology, pulmonology, nephrology and gastroenterology. She was found to have a new diagnosis of liver cirrhosis with ascites, new right pleural effusion. She underwent thoracentesis which was notable for presence of malignant cells, etiology remains unclear.   Assessment & Plan:  Acute on chronic kidney disease/Hepatorenal syndrome/hyperkalemia/hyponatremia  - oliguric in setting of anasarca and likely cirrhosis (as seen on Abdominal US). - FeNa is low consistent with hepatorenal syndrome, no proteinuria, bland urine sediment, normal ESR - pt determined not to be HD candidate - lasix added per nephrology, for comfort only - PCT consulted, assistance is appreciated  - pt to be discharged to residential hospice   Liver cirrhosis of unclear etiology/hepatic cysts - Liver cysts also noted. - per GI team, patient's ascitic fluid does not support cirrhosis (SAG << 1.1) as the primary cause for her ascites, evidence for hepatic cirrhosis is weak and is limited to the suggestion of cirrhosis on one ultrasound (but not others) in  the presence of ascites. - GI recommended focusing on areas other than the liver regarding her anasarca and ascites. No further recommendations. GI signed off 4/25 - d/w with IR, can not do anything about liver cysts but can do omental biopsy - after discussion with PCT, oncology team, focus on comfort  - plan to d/c to residential hospice today   Right-sided pleural effusion - malignant cells noted, etiology remains unclear  - no further work up   Anasarca with ascites, pleural effusion - not HD candidate - lasix per nephrology, please note only for comfort   Nausea and vomiting - nausea but no vomiting noted, provide antiemetics as needed   Hypotension - reasonably stable BP this AM   New onset atrial fibrillation - CHADs2vasc score is 3. Patient started on intravenous heparin.  - transitioned to full comfort   Recent Pneumonia in early April - Cultures negative so far. Antibiotics were discontinued 4/20.  - Sepsis appears to have been ruled out.  Mildly elevated troponin - Likely demand ischemia. Cardiology has no further recommendations.   Yeast in urine - Patient completed a course of Diflucan.  Code Status: DNR Family Communication: Patient and family at bedside  Disposition Plan: Residential hospice  Consultants:   [place]  Nephrology  PCT  GI  IR  Antimicrobials:  Completed  Procedures/Studies: Ct Abdomen Pelvis Wo Contrast  11/20/2015  CLINICAL DATA:  Suspected cirrhosis. Ascites. Diffuse abdominal pain. EXAM: CT ABDOMEN AND PELVIS WITHOUT CONTRAST TECHNIQUE: Multidetector CT imaging of the abdomen and pelvis was performed following the standard protocol without IV contrast. COMPARISON:  11/14/2015 abdominal ultrasound. Chest CT of 11/12/2015. FINDINGS: Lower chest: Bibasilar airspace disease, worse on the left. Mild cardiomegaly. Trace left and moderate right pleural  effusions. Hepatobiliary: Multifactorial degradation, including lack of IV  contrast and patient body habitus. Old granulomatous disease in the liver. Suspicion of cirrhosis on prior ultrasound. Less apparent today onCT. Capsular based low-density foci are likely subcapsular fluid collections. Example straddling the right and left lobe of the liver at 8.1 x 9.5 cm on image 25/series 2. Cholecystectomy, without biliary duct dilatation. Pancreas: Marked pancreatic atrophy without dominant mass or acute inflammation Spleen: Normal in size, without focal abnormality. Adrenals/Urinary Tract: Normal adrenal glands. Mild renal cortical thinning bilaterally. A too small to characterize upper pole left renal lesion. No hydronephrosis. Foley catheter within a decompressed urinary bladder. Stomach/Bowel: Normal stomach, without wall thickening. Scattered colonic diverticula. Normal small bowel caliber. Vascular/Lymphatic: Aortic and branch vessel atherosclerosis. No abdominopelvic adenopathy. Reproductive: Normal uterus. Right adnexal/ovarian low-density lesion is likely a residual follicle at 2.0 cm. Other: Moderate volume abdominal ascites. The abdominal ascites is loculated, primarily positioned anteriorly (without significant fluid in the paracolic gutters). There is also adjacent omental thickening, including on image 56/series 2. Fluid identified within the lesser sac on image 35/series 2. Anasarca. Musculoskeletal: Advanced lumbosacral spondylosis. IMPRESSION: 1. Multifactorial degradation, including patient body habitus and lack of IV contrast. 2. Moderate volume ascites, primarily loculated anteriorly. Concurrent omental edema/thickening. Probable cirrhosis. Although ascites could be secondary to portal venous hypertension, given the omental edema, omental/peritoneal metastasis cannot be excluded. Correlate with primary malignancy history and the result of recent paracentesis. 3. Capsular based low-density foci within the liver are likely due to subcapsular fluid collections. Capsular based  cysts felt less likely. 4. No definite primary malignancy identified within the abdomen or pelvis. 5. Right larger than left pleural effusions with right base atelectasis and left base infection versus atelectasis. 6. Pancreatic atrophy. Electronically Signed   By: Abigail Miyamoto M.D.   On: 11/20/2015 09:02   Dg Chest 2 View  11/14/2015  CLINICAL DATA:  Acute respiratory failure with hypoxemia. EXAM: CHEST  2 VIEW COMPARISON:  November 12, 2015. FINDINGS: Stable cardiomediastinal silhouette. No pneumothorax is noted. Left lung is clear. Increased right midlung opacity is noted concerning for subsegmental atelectasis mild right pleural effusion remains. Stable presence of calcified loose body in right shoulder area. IMPRESSION: Increased right midlung opacity is noted most consistent with subsegmental atelectasis. Mild right pleural effusion is noted as well. Electronically Signed   By: Marijo Conception, M.D.   On: 11/14/2015 07:40   Dg Chest 2 View  11/12/2015  CLINICAL DATA:  Shortness of breath for 2 weeks. Recent diagnosis of pneumonia. EXAM: CHEST  2 VIEW COMPARISON:  None. FINDINGS: Heart size and pulmonary vascularity are normal. There is calcification in the arch of the aorta. There is a moderate right pleural effusion. Left lung is clear. No acute bone abnormality. Degenerative changes of both shoulders with a large calcified loose body in the right shoulder joint in the subcoracoid recess. IMPRESSION: Moderate right pleural effusion.  Aortic atherosclerosis. Electronically Signed   By: Lorriane Shire M.D.   On: 11/12/2015 16:09   Dg Abd 1 View  11/13/2015  CLINICAL DATA:  Abdominal distention EXAM: ABDOMEN - 1 VIEW COMPARISON:  None. FINDINGS: Mild gastric distention. Nonobstructive bowel gas pattern. Cholecystectomy clips. Degenerative changes of the visualized thoracolumbar spine. IMPRESSION: Unremarkable abdominal radiograph. Electronically Signed   By: Julian Hy M.D.   On: 11/13/2015 00:04    Ct Chest Wo Contrast  11/12/2015  CLINICAL DATA:  Shortness of breath, 2 weeks duration. Treated for pneumonia. Pleural effusion. EXAM: CT CHEST  WITHOUT CONTRAST TECHNIQUE: Multidetector CT imaging of the chest was performed following the standard protocol without IV contrast. COMPARISON:  Radiography same day FINDINGS: There is a moderate to large pleural effusion on the right layering dependently. There is dependent atelectasis in the right lung. The aerated portion of the right lung is clear except for minimal atelectasis or scarring in the middle lobe. No pleural effusion on the left. There is minimal atelectasis at the left base. The left lung is otherwise clear. No underlying emphysema. No mediastinal or hilar mass or lymphadenopathy. There is atherosclerosis of the aorta and extensive coronary artery calcification. Scans in the upper abdomen show a large amount of ascites. There are at least 2 large liver cysts, not completely evaluated. The larger cyst at the junction of the right and left lobes measures up to 11 cm in diameter. Right lobe cyst partially imaged measures up to 7 cm in diameter. IMPRESSION: Right pleural effusion layering dependently with dependent pulmonary atelectasis. The aerated lung does not show evidence of pre-existing lung disease or active pneumonia. Atherosclerosis of the aorta and the coronary arteries. Large amount of ascites. Two large liver cysts. The abdomen was not completely evaluated. Electronically Signed   By: Nelson Chimes M.D.   On: 11/12/2015 18:30   US Abdomen Complete  11/14/2015  CLINICAL DATA:  Ascites. Evaluate liver. Acute renal failure. History of hypertension and congestive heart failure. History of a cholecystectomy. EXAM: ABDOMEN ULTRASOUND COMPLETE COMPARISON:  11/13/2015 FINDINGS: Gallbladder: Surgically absent. Common bile duct: Diameter: 6 mm Liver: 2 large cystic areas, 1 projecting along the anterior segment right lobe/medial segmental left lobe  measuring 8.9 x 7.8 x 10.3 cm. The other arising from the posterior aspect of the right lobe measuring 6.6 x 6.1 x 7.3 cm. Liver demonstrates a coarsened echotexture with a nodular surface contour. Liver normal in overall size. IVC: Limited visualization.  No gross abnormality. Pancreas: Limited visualization.  No gross abnormality. Spleen: Size and appearance within normal limits. Right Kidney: Length: 10.6 cm. Mild renal cortical thinning. Echogenicity within normal limits. No mass or hydronephrosis visualized. Left Kidney: Length: 10.9 cm. Mild renal cortical thinning. Echogenicity within normal limits. No mass or hydronephrosis visualized. Abdominal aorta: Not well seen from the midportion to the bifurcation. Gross aneurysm. Other findings: Moderate amount of ascites similar to the previous day's study. Right pleural effusion. IMPRESSION: 1. Appearance of the liver is consistent with cirrhosis. There are 2 large with her cystic masses, likely simple hepatic cysts. Consider follow-up liver MRI with and without contrast for further assessment and lesion characterization. 2. No acute findings. 3. Moderate ascites. Electronically Signed   By: Lajean Manes M.D.   On: 11/14/2015 15:43   US Paracentesis  11/19/2015  INDICATION: Suspected cirrhosis. Chronic diastolic heart failure. Fluid overload. Ascites. Request for diagnostic paracentesis with 250 ml maximum. EXAM: ULTRASOUND GUIDED LEFT ANTERIOR ABDOMEN PARACENTESIS MEDICATIONS: 1% Lidocaine. COMPLICATIONS: None immediate. PROCEDURE: Informed written consent was obtained from the patient after a discussion of the risks, benefits and alternatives to treatment. A timeout was performed prior to the initiation of the procedure. Initial ultrasound scanning demonstrates a large amount of ascites within the right lower abdominal quadrant. The right lower abdomen was prepped and draped in the usual sterile fashion. 1% lidocaine with epinephrine was used for local  anesthesia. Following this, a 19 gauge, 7-cm, Yueh catheter was introduced. An ultrasound image was saved for documentation purposes. The paracentesis was performed. The catheter was removed and a dressing was applied.  The patient tolerated the procedure well without immediate post procedural complication. FINDINGS: A total of approximately 250 ml of clear yellow fluid was removed. Samples were sent to the laboratory as requested by the clinical team. IMPRESSION: Successful ultrasound-guided paracentesis yielding 250 ml of peritoneal fluid. Read by:  Gareth Eagle, PA-C Electronically Signed   By: Markus Daft M.D.   On: 11/19/2015 14:42   Dg Chest Port 1 View  11/16/2015  CLINICAL DATA:  Status post right thoracentesis. EXAM: PORTABLE CHEST 1 VIEW COMPARISON:  11/14/2015 FINDINGS: Normal heart size. There is scratch set there has been interval decrease in volume of the right pleural effusion. No pneumothorax identified. Asymmetric elevation of left hemidiaphragm is again noted. IMPRESSION: 1. Decrease in volume of right pleural effusion following thoracentesis. No pneumothorax. Electronically Signed   By: Kerby Moors M.D.   On: 11/16/2015 14:09   Dg Abd Portable 2v  11/18/2015  CLINICAL DATA:  Abdominal pain EXAM: PORTABLE ABDOMEN - 2 VIEW COMPARISON:  11/12/2015 abdominal radiograph FINDINGS: Moderate gaseous distention of the stomach. No dilated small bowel loops or air-fluid levels. Mild stool throughout the colon. No evidence of pneumatosis or pneumoperitoneum. Marked degenerative changes in the visualized thoracolumbar spine. Cholecystectomy clips are seen in the right upper quadrant of the abdomen. IMPRESSION: 1. Nonspecific moderate gaseous distention of the stomach . 2. No evidence of small or large bowel obstruction. Electronically Signed   By: Ilona Sorrel M.D.   On: 11/18/2015 14:12   US Abdomen Limited Ruq  11/13/2015  CLINICAL DATA:  Abdominal distention for 1 month EXAM: LIMITED ABDOMEN  ULTRASOUND FOR ASCITES TECHNIQUE: Limited ultrasound survey for ascites was performed in all four abdominal quadrants. COMPARISON:  None. FINDINGS: There is moderate generalized ascites. There are loops of surrounding peristalsing bowel. IMPRESSION: Moderate generalized ascites. Electronically Signed   By: Lowella Grip III M.D.   On: 11/13/2015 09:22    Discharge Exam: Filed Vitals:   11/22/15 0900 11/23/15 0406  BP: 104/72 123/75  Pulse: 75 103  Temp: 98.2 F (36.8 C) 97.6 F (36.4 C)  Resp: 16 17   Filed Vitals:   11/22/15 0138 11/22/15 0433 11/22/15 0900 11/23/15 0406  BP: 114/80 98/79 104/72 123/75  Pulse: 105 75 75 103  Temp: 98.6 F (37 C) 98.4 F (36.9 C) 98.2 F (36.8 C) 97.6 F (36.4 C)  TempSrc: Oral Oral Axillary Axillary  Resp: 18 18 16 17   Height:      Weight:  125.1 kg (275 lb 12.7 oz)    SpO2: 92% 97% 100% 100%    General: Pt is sleeping, comfortable, NAD, diffuse anasarca, exam deferred to ensure comfort per family request   Discharge Instructions  Discharge Instructions    Diet - low sodium heart healthy    Complete by:  As directed      Increase activity slowly    Complete by:  As directed             Medication List    STOP taking these medications        BREO ELLIPTA 100-25 MCG/INH Aepb  Generic drug:  fluticasone furoate-vilanterol     omeprazole 20 MG capsule  Commonly known as:  PRILOSEC     traMADol 50 MG tablet  Commonly known as:  ULTRAM      TAKE these medications        albuterol 108 (90 Base) MCG/ACT inhaler  Commonly known as:  PROVENTIL HFA;VENTOLIN HFA  Inhale 1-2 puffs into the  lungs every 4 (four) hours as needed for wheezing or shortness of breath.     ALPRAZolam 0.25 MG tablet  Commonly known as:  XANAX  Take 1 tablet (0.25 mg total) by mouth 3 (three) times daily as needed for anxiety.     DULERA 200-5 MCG/ACT Aero  Generic drug:  mometasone-formoterol  Inhale 2 puffs into the lungs 2 (two) times daily.      furosemide 80 MG tablet  Commonly known as:  LASIX  Take 1 tablet (80 mg total) by mouth 2 (two) times daily.     metoprolol tartrate 25 MG tablet  Commonly known as:  LOPRESSOR  Take 25 mg by mouth 2 (two) times daily.     morphine CONCENTRATE 10 MG/0.5ML Soln concentrated solution  Take 0.5 mLs (10 mg total) by mouth every 6 (six) hours as needed for severe pain or shortness of breath.     ondansetron 4 MG tablet  Commonly known as:  ZOFRAN  Take 1 tablet (4 mg total) by mouth every 6 (six) hours as needed for nausea.     oxyCODONE-acetaminophen 5-325 MG tablet  Commonly known as:  ROXICET  Take 1-2 tablets by mouth every 4 (four) hours as needed for moderate pain or severe pain.     promethazine 25 MG tablet  Commonly known as:  PHENERGAN  Take 1 tablet (25 mg total) by mouth every 6 (six) hours as needed for nausea, vomiting or refractory nausea / vomiting.     sertraline 25 MG tablet  Commonly known as:  ZOLOFT  Take 25 mg by mouth daily.     SPIRIVA RESPIMAT 2.5 MCG/ACT Aers  Generic drug:  Tiotropium Bromide Monohydrate  Inhale 2 puffs into the lungs daily.           The results of significant diagnostics from this hospitalization (including imaging, microbiology, ancillary and laboratory) are listed below for reference.     Microbiology: Recent Results (from the past 240 hour(s))  Urine culture     Status: Abnormal   Collection Time: 11/13/15 11:32 AM  Result Value Ref Range Status   Specimen Description URINE, CLEAN CATCH  Final   Special Requests NONE  Final   Culture 60,000 COLONIES/mL YEAST (A)  Final   Report Status 11/14/2015 FINAL  Final  MRSA PCR Screening     Status: None   Collection Time: 11/13/15  5:21 PM  Result Value Ref Range Status   MRSA by PCR NEGATIVE NEGATIVE Final    Comment:        The GeneXpert MRSA Assay (FDA approved for NASAL specimens only), is one component of a comprehensive MRSA colonization surveillance program. It is  not intended to diagnose MRSA infection nor to guide or monitor treatment for MRSA infections.   Culture, body fluid-bottle     Status: None   Collection Time: 11/16/15 12:40 PM  Result Value Ref Range Status   Specimen Description FLUID RIGHT PLEURAL  Final   Special Requests BOTTLES DRAWN AEROBIC AND ANAEROBIC 3CC  Final   Culture NO GROWTH 5 DAYS  Final   Report Status 11/21/2015 FINAL  Final  Gram stain     Status: None   Collection Time: 11/16/15 12:40 PM  Result Value Ref Range Status   Specimen Description FLUID RIGHT PLEURAL  Final   Special Requests NONE  Final   Gram Stain   Final    ABUNDANT WBC PRESENT,BOTH PMN AND MONONUCLEAR NO ORGANISMS SEEN    Report Status  11/16/2015 FINAL  Final  Culture, body fluid-bottle     Status: None (Preliminary result)   Collection Time: 11/19/15 12:44 PM  Result Value Ref Range Status   Specimen Description FLUID PERITONEAL  Final   Special Requests BOTTLES DRAWN AEROBIC AND ANAEROBIC 8CC  Final   Culture NO GROWTH 3 DAYS  Final   Report Status PENDING  Incomplete  Gram stain     Status: None   Collection Time: 11/19/15 12:44 PM  Result Value Ref Range Status   Specimen Description FLUID PERITONEAL  Final   Special Requests NONE  Final   Gram Stain   Final    WBC PRESENT,BOTH PMN AND MONONUCLEAR NO ORGANISMS SEEN CYTOSPIN SMEAR    Report Status 11/19/2015 FINAL  Final     Labs: Basic Metabolic Panel:  Recent Labs Lab 11/19/15 0418 11/20/15 0807 11/21/15 0554 11/22/15 0520 11/23/15 0553  NA 136 133* 132* 133* 134*  K 4.3 5.1 5.2* 5.3* 5.6*  CL 98* 97* 96* 96* 96*  CO2 23 20* 19* 21* 23  GLUCOSE 103* 111* 119* 102* 105*  BUN 92* 95* 105* 110* 114*  CREATININE 3.44* 3.37* 3.67* 3.47* 3.39*  CALCIUM 8.4* 8.4* 8.4* 8.4* 8.6*  PHOS  --  5.9* 5.9* 6.1* 6.4*   Liver Function Tests:  Recent Labs Lab 11/20/15 0807 11/21/15 0554 11/22/15 0520 11/23/15 0553  ALBUMIN 2.0* 2.1* 2.0* 2.0*    Recent Labs Lab  11/19/15 0418  AMMONIA 33   CBC:  Recent Labs Lab 11/19/15 0418 11/20/15 0807 11/21/15 0554 11/22/15 0520 11/23/15 0553  WBC 16.4* 16.7* 17.5* 14.8* 13.4*  HGB 12.1 12.3 12.5 12.2 12.3  HCT 37.3 38.0 37.5 37.7 38.0  MCV 93.3 93.1 93.1 94.0 94.5  PLT 229 283 270 308 229   BNP (last 3 results)  Recent Labs  11/12/15 1620  BNP 470.9*    SIGNED: Time coordinating discharge: 30 minutes  Faye Ramsay, MD  [facility] 11/23/2015, 10:05 AM Pager [phone]  If 7PM-7AM, please contact night-coverage [email or web address] Password [email or web address]

## 2015-11-23 NOTE — Progress Notes (Signed)
Report called to Clarene Critchley, Therapist, sports at [facility] for discharge once transportation available.

## 2015-11-23 NOTE — Progress Notes (Signed)
[facility] can accept pt today- pt spouse is agreeable  Patient will discharge to [facility] Anticipated discharge date: 4/28 Family notified: at bedside Transportation by [facility]- called at [place] signing off.  Domenica Reamer, [place] Social Worker [phone]

## 2015-11-23 NOTE — Discharge Instructions (Signed)
Acute Kidney Injury °Acute kidney injury is any condition in which there is sudden (acute) damage to the kidneys. Acute kidney injury was previously known as acute kidney failure or acute renal failure. The kidneys are two organs that lie on either side of the spine between the middle of the back and the front of the abdomen. The kidneys: °· Remove wastes and extra water from the blood.   °· Produce important hormones. These help keep bones strong, regulate blood pressure, and help create red blood cells.   °· Balance the fluids and chemicals in the blood and tissues. °A small amount of kidney damage may not cause problems, but a large amount of damage may make it difficult or impossible for the kidneys to work the way they should. Acute kidney injury may develop into long-lasting (chronic) kidney disease. It may also develop into a life-threatening disease called end-stage kidney disease. Acute kidney injury can get worse very quickly, so it should be treated right away. Early treatment may prevent other kidney diseases from developing. °CAUSES  °· A problem with blood flow to the kidneys. This may be caused by:   °¨ Blood loss.   °¨ Heart disease.   °¨ Severe burns.   °¨ Liver disease. °· Direct damage to the kidneys. This may be caused by: °¨ Some medicines.   °¨ A kidney infection.   °¨ Poisoning or consuming toxic substances.   °¨ A surgical wound.   °¨ A blow to the kidney area.   °· A problem with urine flow. This may be caused by:   °¨ Cancer.   °¨ Kidney stones.   °¨ An enlarged prostate. °SIGNS AND SYMPTOMS  °· Swelling (edema) of the legs, ankles, or feet.   °· Tiredness (lethargy).   °· Nausea or vomiting.   °· Confusion.   °· Problems with urination, such as:   °¨ Painful or burning feeling during urination.   °¨ Decreased urine production.   °¨ Frequent accidents in children who are potty trained.   °¨ Bloody urine.   °· Muscle twitches and cramps.   °· Shortness of breath.   °· Seizures.   °· Chest  pain or pressure. °Sometimes, no symptoms are present.  °DIAGNOSIS °Acute kidney injury may be detected and diagnosed by tests, including blood, urine, imaging, or kidney biopsy tests.  °TREATMENT °Treatment of acute kidney injury varies depending on the cause and severity of the kidney damage. In mild cases, no treatment may be needed. The kidneys may heal on their own. If acute kidney injury is more severe, your health care provider will treat the cause of the kidney damage, help the kidneys heal, and prevent complications from occurring. Severe cases may require a procedure to remove toxic wastes from the body (dialysis) or surgery to repair kidney damage. Surgery may involve:  °· Repair of a torn kidney.   °· Removal of an obstruction. °HOME CARE INSTRUCTIONS °· Follow your prescribed diet. °· Take medicines only as directed by your health care provider.  °· Do not take any new medicines (prescription, over-the-counter, or nutritional supplements) unless approved by your health care provider. Many medicines can worsen your kidney damage or may need to have the dose adjusted.   °· Keep all follow-up visits as directed by your health care provider. This is important. °· Observe your condition to make sure you are healing as expected. °SEEK IMMEDIATE MEDICAL CARE IF: °· You are feeling ill or have severe pain in the back or side.   °· Your symptoms return or you have new symptoms. °· You have any symptoms of end-stage kidney disease. These include:   °¨ Persistent itchiness.   °¨   Loss of appetite.   °¨ Headaches.   °¨ Abnormally dark or light skin. °¨ Numbness in the hands or feet.   °¨ Easy bruising.   °¨ Frequent hiccups.   °¨ Menstruation stops.   °· You have a fever. °· You have increased urine production. °· You have pain or bleeding when urinating. °MAKE SURE YOU:  °· Understand these instructions. °· Will watch your condition. °· Will get help right away if you are not doing well or get worse. °  °This  information is not intended to replace advice given to you by your health care provider. Make sure you discuss any questions you have with your health care provider. °  °Document Released: 01/27/2011 Document Revised: 08/04/2014 Document Reviewed: 03/12/2012 °Elsevier Interactive Patient Education ©2016 [employer] ° °

## 2015-11-23 NOTE — Progress Notes (Signed)
OT Discharge Note  Patient Details Name: Kendra Martinez MRN: [id number] DOB: 1934-05-16   Cancelled Treatment:    Reason Eval/Treat Not Completed:  Pt now receiving comfort measures only with plan to d/c to hospice facility. Signing off.  [name] 11/23/2015, 8:26 AM

## 2015-11-23 NOTE — Progress Notes (Signed)
Patient ID: Kendra Martinez, female   DOB: 04-Jun-1934, 82 y.o.   MRN: [id number] S: decision to move to comfort care. Surrounded by family. No new complaints.  O:BP 123/75 mmHg  Pulse 103  Temp(Src) 97.6 F (36.4 C) (Axillary)  Resp 17  Ht 5\' 3"  (1.6 m)  Wt 275 lb 12.7 oz (125.1 kg)  BMI 48.87 kg/m2  SpO2 100%  Intake/Output Summary (Last 24 hours) at 11/23/15 1246 Last data filed at 11/22/15 2000  Gross per 24 hour  Intake  460.5 ml  Output    201 ml  Net  259.5 ml   Intake/Output: I/O last 3 completed shifts: In: 580.5 [P.O.:360; I.V.:220.5] Out: 401 [Urine:400; Stool:1]  Intake/Output this shift:    Weight change:  Gen: frail elderly, pale WF in NAD CVS: no rub Resp: decreased BS at bases Abd: distended, obese, nontender Ext: 1-2+ edema   Recent Labs Lab 11/17/15 0218 11/18/15 0606 11/19/15 0418 11/20/15 0807 11/21/15 0554 11/22/15 0520 11/23/15 0553  NA 133* 136 136 133* 132* 133* 134*  K 4.3 4.1 4.3 5.1 5.2* 5.3* 5.6*  CL 99* 97* 98* 97* 96* 96* 96*  CO2 17* 21* 23 20* 19* 21* 23  GLUCOSE 108* 109* 103* 111* 119* 102* 105*  BUN 76* 84* 92* 95* 105* 110* 114*  CREATININE 2.84* 3.09* 3.44* 3.37* 3.67* 3.47* 3.39*  ALBUMIN  --   --   --  2.0* 2.1* 2.0* 2.0*  CALCIUM 8.2* 8.5* 8.4* 8.4* 8.4* 8.4* 8.6*  PHOS  --   --   --  5.9* 5.9* 6.1* 6.4*   Liver Function Tests:  Recent Labs Lab 11/21/15 0554 11/22/15 0520 11/23/15 0553  ALBUMIN 2.1* 2.0* 2.0*   No results for input(s): LIPASE, AMYLASE in the last 168 hours.  Recent Labs Lab 11/19/15 0418  AMMONIA 33   CBC:  Recent Labs Lab 11/19/15 0418 11/20/15 0807 11/21/15 0554 11/22/15 0520 11/23/15 0553  WBC 16.4* 16.7* 17.5* 14.8* 13.4*  HGB 12.1 12.3 12.5 12.2 12.3  HCT 37.3 38.0 37.5 37.7 38.0  MCV 93.3 93.1 93.1 94.0 94.5  PLT 229 283 270 308 229   Cardiac Enzymes: No results for input(s): CKTOTAL, CKMB, CKMBINDEX, TROPONINI in the last 168 hours. CBG: No results for input(s): GLUCAP in  the last 168 hours.  Iron Studies: No results for input(s): IRON, TIBC, TRANSFERRIN, FERRITIN in the last 72 hours. Studies/Results: No results found. . furosemide  80 mg Intravenous Q12H  . magic mouthwash  5 mL Oral TID  . metoprolol tartrate  12.5 mg Oral BID  . midodrine  10 mg Oral TID WC  . mometasone-formoterol  2 puff Inhalation BID  . octreotide  100 mcg Subcutaneous Q12H  . ondansetron (ZOFRAN) IV  4 mg Intravenous Once  . sertraline  25 mg Oral Daily  . sodium bicarbonate  650 mg Oral BID  . sodium chloride flush  3 mL Intravenous Q12H    BMET    Component Value Date/Time   NA 134* 11/23/2015 0553   K 5.6* 11/23/2015 0553   CL 96* 11/23/2015 0553   CO2 23 11/23/2015 0553   GLUCOSE 105* 11/23/2015 0553   BUN 114* 11/23/2015 0553   CREATININE 3.39* 11/23/2015 0553   CALCIUM 8.6* 11/23/2015 0553   GFRNONAA 12* 11/23/2015 0553   GFRAA 14* 11/23/2015 0553   CBC    Component Value Date/Time   WBC 13.4* 11/23/2015 0553   RBC 4.02 11/23/2015 0553   HGB 12.3 11/23/2015 0553  HCT 38.0 11/23/2015 0553   PLT 229 11/23/2015 0553   MCV 94.5 11/23/2015 0553   MCH 30.6 11/23/2015 0553   MCHC 32.4 11/23/2015 0553   RDW 15.8* 11/23/2015 0553   LYMPHSABS 1.0 11/12/2015 1619   MONOABS 1.3* 11/12/2015 1619   EOSABS 0.0 11/12/2015 1619   BASOSABS 0.0 11/12/2015 1619   Assessment/Plan:  1. ARF, oliguric in setting of anasarca and likely cirrhosis (as seen on Abdominal US). Hepatorenal syndrome, therefore dialysis is not indicated. Decision by pt and family to move to comfort care.  1. Agree w/ decision 2. Continue Lasix 80mg  BID if providing any sx relief  2. Anasarca-  GI following. CA125 elevated to 626.6.  3. CHF- ECHO with EF 45-50%.  4. Disposition- comfort/hospice   Elberta Leatherwood, MD,MS,  PGY2 11/23/2015 12:46 PM  I have seen and examined this patient and agree with plan as outlined by Dr. Alease Frame.  Agree with transfer to [facility].  Appreciate Palliative care  assistance as well as Dr. Doyle Askew'. Governor Rooks Lash Matulich,MD 11/23/2015 12:47 PM

## 2015-11-24 LAB — CULTURE, BODY FLUID W GRAM STAIN -BOTTLE: Culture: NO GROWTH

## 2015-11-24 LAB — CULTURE, BODY FLUID-BOTTLE

## 2015-11-26 NOTE — Progress Notes (Signed)
1.75mg  of IV ativan wasted in sink with Carroll Kinds, RN.

## 2015-11-26 DEATH — deceased

## 2016-01-07 DIAGNOSIS — R109 Unspecified abdominal pain: Secondary | ICD-10-CM | POA: Insufficient documentation

## 2017-01-12 IMAGING — CT CT ABD-PELV W/O CM
2 of 4 series · 16 of 46 positions shown, 18 images · non-contrast
Comparison: 11/14/2015 abdominal ultrasound. Chest CT of
11/12/2015.

CLINICAL DATA: Suspected cirrhosis. Ascites. Diffuse abdominal
pain.

EXAM:
CT ABDOMEN AND PELVIS WITHOUT CONTRAST
TECHNIQUE: Multidetector CT imaging of the abdomen and pelvis was performed
following the standard protocol without IV contrast.

[Series 2: a/p w/o 5mm · axial · non-contrast · 0.98mm/px · z∈[+1026,+1486]mm · 13 of 100 slices shown, 15 images]
[im 4/100  soft-tissue]
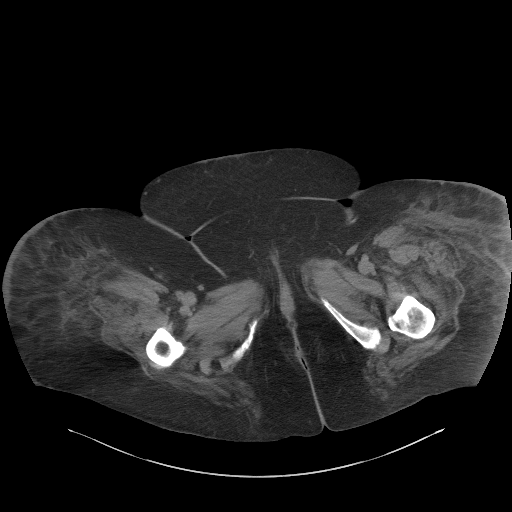
[im 4/100  bone]
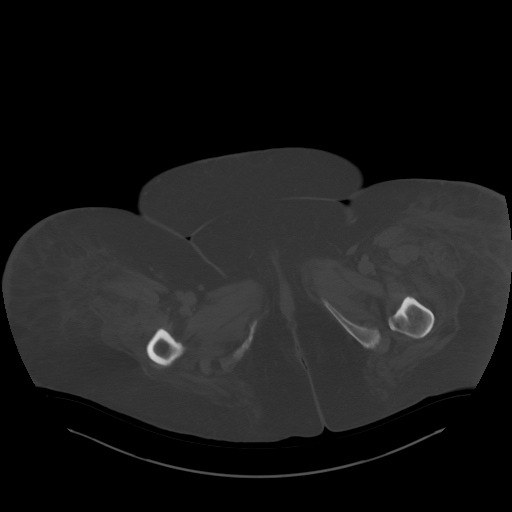
[im 12/100  soft-tissue]
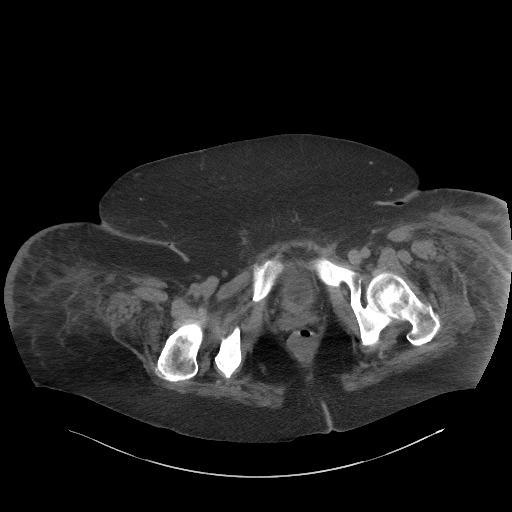
[im 20/100  soft-tissue]
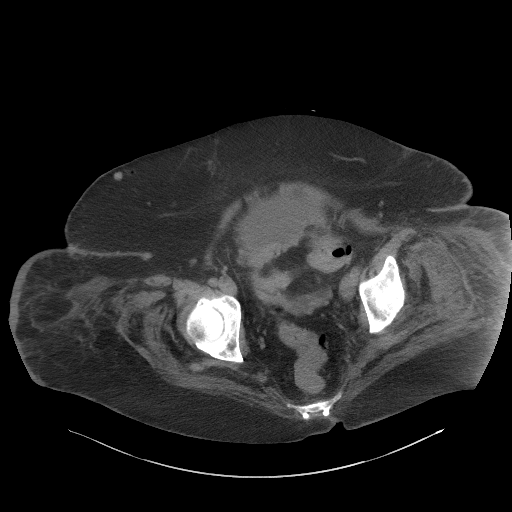
[im 27/100  soft-tissue]
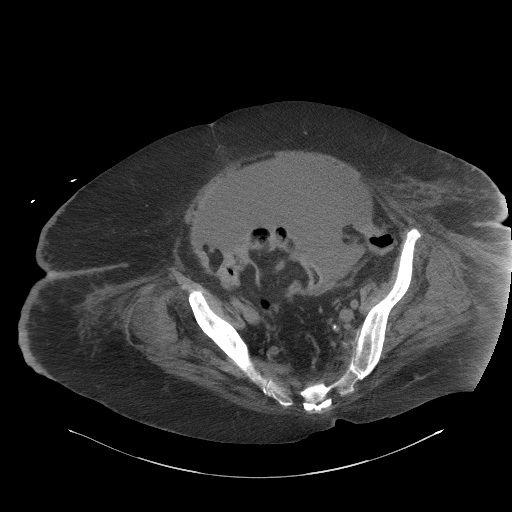
[im 35/100  soft-tissue]
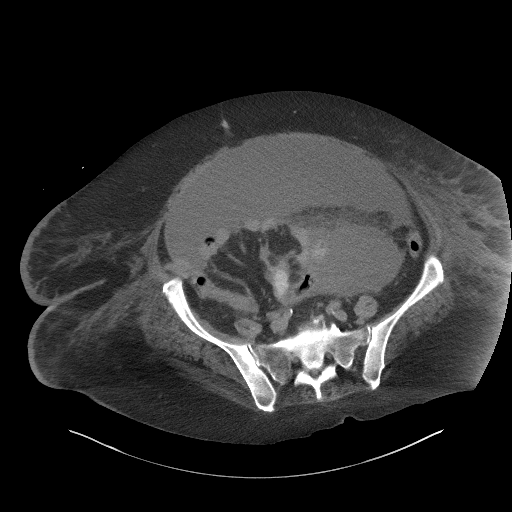
[im 42/100  soft-tissue]
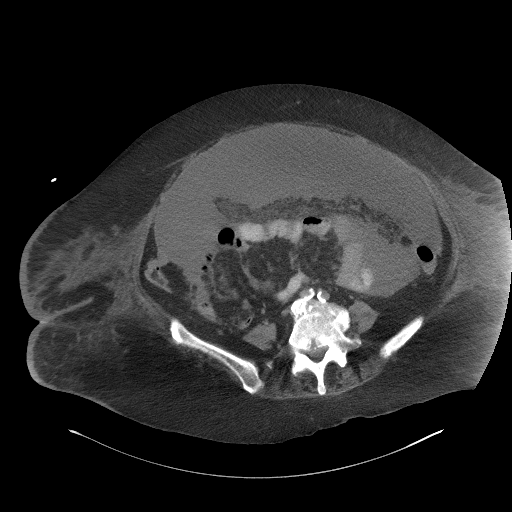
[im 50/100  soft-tissue]
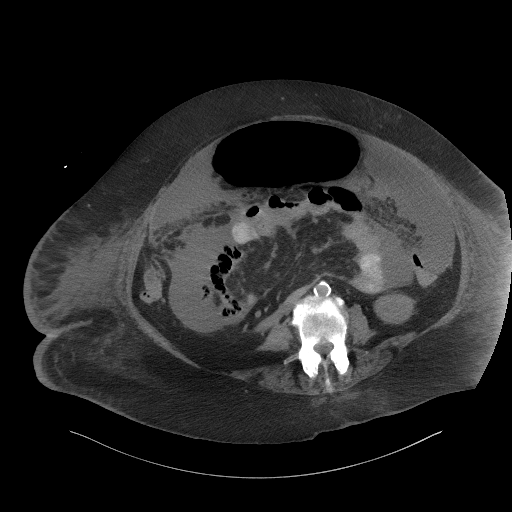
[im 58/100  soft-tissue]
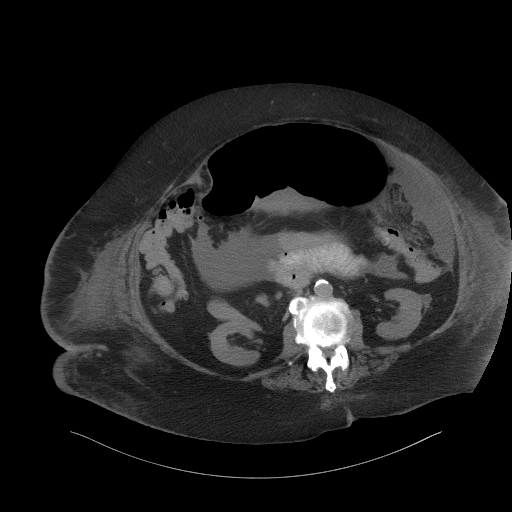
[im 65/100  soft-tissue]
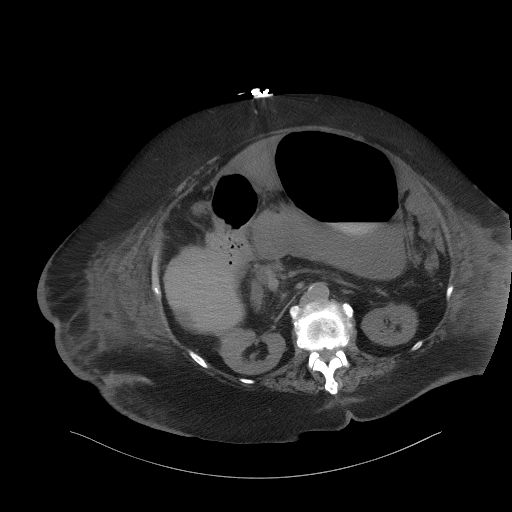
[im 65/100  bone]
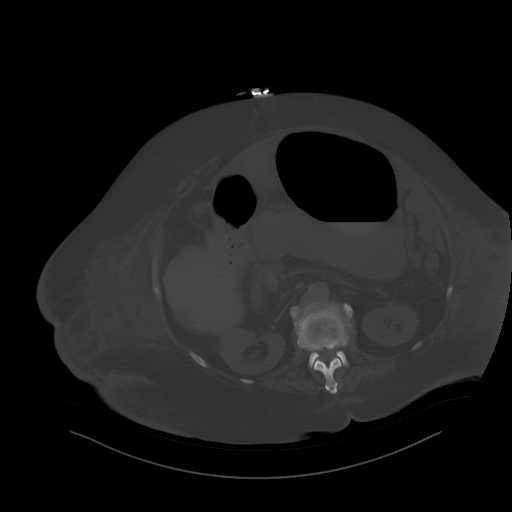
[im 73/100  soft-tissue]
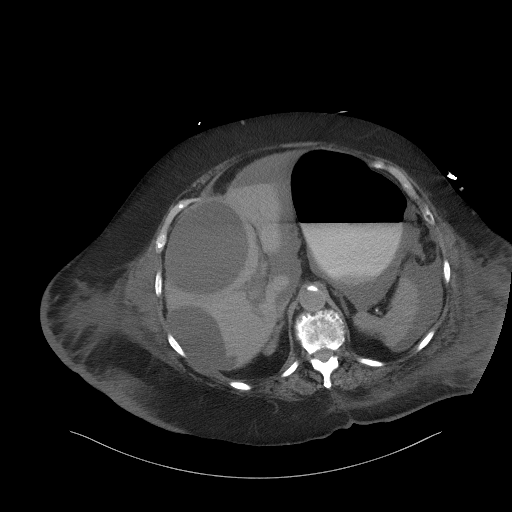
[im 80/100  soft-tissue]
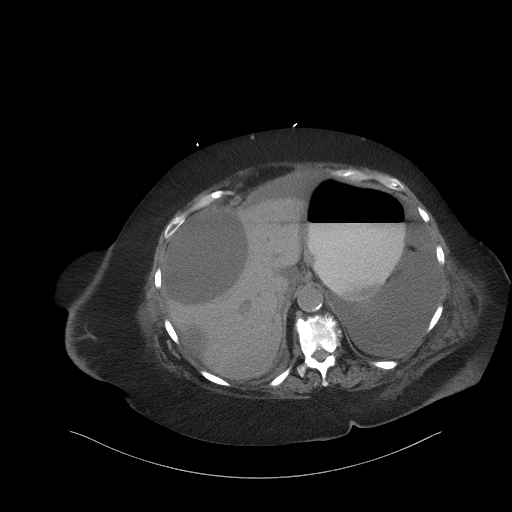
[im 88/100  soft-tissue]
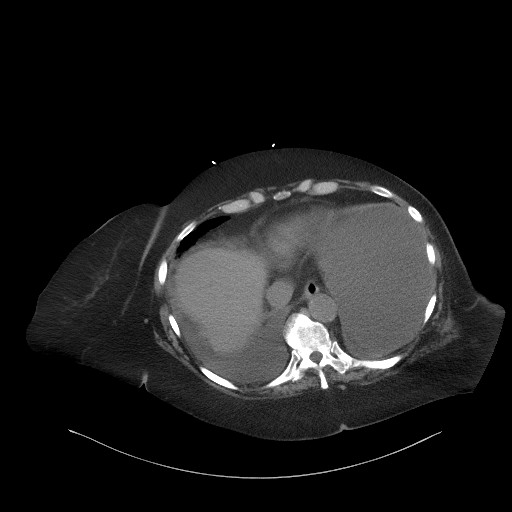
[im 96/100  soft-tissue]
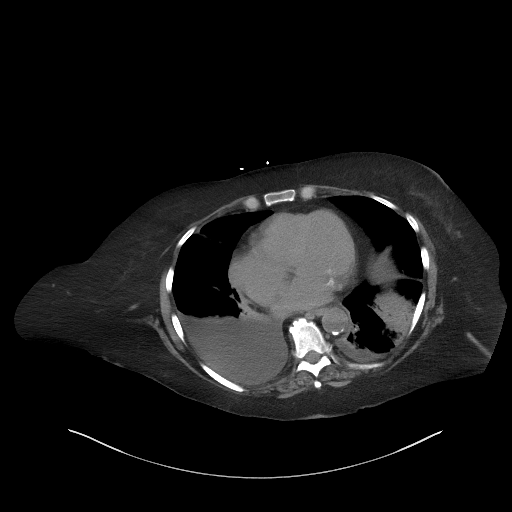

[Series 5: a/p w/o cor · coronal · non-contrast · 1.06mm/px · 3 of 195 slices shown]
[im 65/195  soft-tissue]
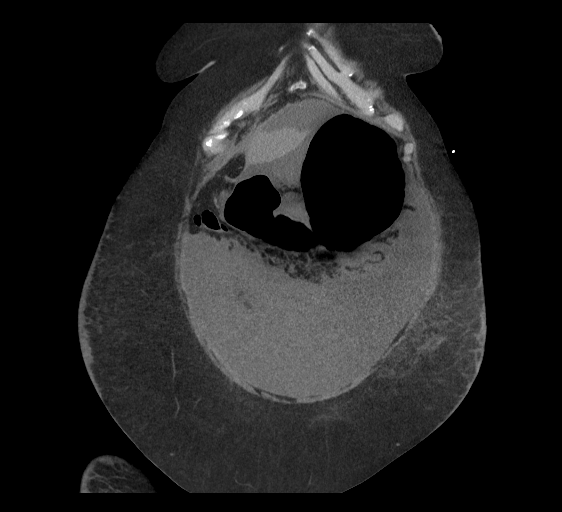
[im 87/195  soft-tissue]
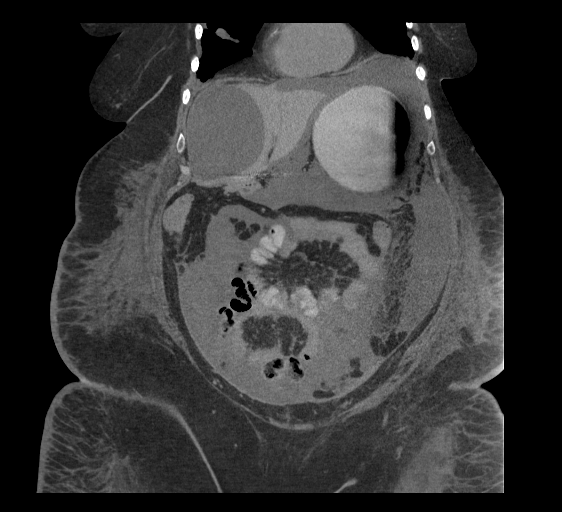
[im 108/195  soft-tissue]
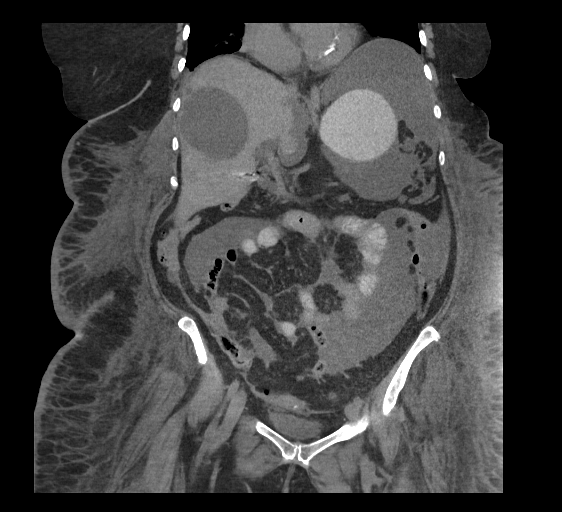

[16 of 46 positions shown; findings below may reference images not displayed]

FINDINGS: Lower chest: Bibasilar airspace disease, worse on the left. Mild
cardiomegaly. Trace left and moderate right pleural effusions.

Hepatobiliary: Multifactorial degradation, including lack of IV
contrast and patient body habitus. Old granulomatous disease in the
liver. Suspicion of cirrhosis on prior ultrasound. Less apparent
today onCT. Capsular based low-density foci are likely subcapsular
fluid collections. Example straddling the right and left lobe of the
liver at 8.1 x 9.5 cm on image 25/series 2. Cholecystectomy, without
biliary duct dilatation.

Pancreas: Marked pancreatic atrophy without dominant mass or acute
inflammation

Spleen: Normal in size, without focal abnormality.

Adrenals/Urinary Tract: Normal adrenal glands. Mild renal cortical
thinning bilaterally. A too small to characterize upper pole left
renal lesion. No hydronephrosis. Foley catheter within a
decompressed urinary bladder.

Stomach/Bowel: Normal stomach, without wall thickening. Scattered
colonic diverticula. Normal small bowel caliber.

Vascular/Lymphatic: Aortic and branch vessel atherosclerosis. No
abdominopelvic adenopathy.

Reproductive: Normal uterus. Right adnexal/ovarian low-density
lesion is likely a residual follicle at 2.0 cm.

Other: Moderate volume abdominal ascites. The abdominal ascites is
loculated, primarily positioned anteriorly (without significant
fluid in the paracolic gutters). There is also adjacent omental
thickening, including on image 56/series 2. Fluid identified within
the lesser sac on image 35/series 2.

Anasarca.

Musculoskeletal: Advanced lumbosacral spondylosis.
IMPRESSION: 1. Multifactorial degradation, including patient body habitus and
lack of IV contrast.
2. Moderate volume ascites, primarily loculated anteriorly.
Concurrent omental edema/thickening. Probable cirrhosis. Although
ascites could be secondary to portal venous hypertension, given the
omental edema, omental/peritoneal metastasis cannot be excluded.
Correlate with primary malignancy history and the result of recent
paracentesis.
3. Capsular based low-density foci within the liver are likely due
to subcapsular fluid collections. Capsular based cysts felt less
likely.
4. No definite primary malignancy identified within the abdomen or
pelvis.
5. Right larger than left pleural effusions with right base
atelectasis and left base infection versus atelectasis.
6. Pancreatic atrophy.
